# Patient Record
Sex: Female | Born: 1935 | Race: Black or African American | Hispanic: No | Marital: Married | Smoking: Never smoker
Health system: Southern US, Community
[De-identification: ages and names within clinical notes are randomized; demographics above are authoritative.]

## PROBLEM LIST (undated history)

## (undated) DIAGNOSIS — Z9289 Personal history of other medical treatment: Secondary | ICD-10-CM

## (undated) DIAGNOSIS — G2581 Restless legs syndrome: Secondary | ICD-10-CM

## (undated) DIAGNOSIS — N3281 Overactive bladder: Secondary | ICD-10-CM

## (undated) DIAGNOSIS — C8590 Non-Hodgkin lymphoma, unspecified, unspecified site: Secondary | ICD-10-CM

## (undated) DIAGNOSIS — M359 Systemic involvement of connective tissue, unspecified: Secondary | ICD-10-CM

## (undated) DIAGNOSIS — I679 Cerebrovascular disease, unspecified: Secondary | ICD-10-CM

## (undated) DIAGNOSIS — I2510 Atherosclerotic heart disease of native coronary artery without angina pectoris: Secondary | ICD-10-CM

## (undated) DIAGNOSIS — Z7952 Long term (current) use of systemic steroids: Secondary | ICD-10-CM

## (undated) DIAGNOSIS — M1990 Unspecified osteoarthritis, unspecified site: Secondary | ICD-10-CM

## (undated) DIAGNOSIS — Q249 Congenital malformation of heart, unspecified: Secondary | ICD-10-CM

## (undated) DIAGNOSIS — F32A Depression, unspecified: Secondary | ICD-10-CM

## (undated) DIAGNOSIS — I639 Cerebral infarction, unspecified: Secondary | ICD-10-CM

## (undated) DIAGNOSIS — I739 Peripheral vascular disease, unspecified: Secondary | ICD-10-CM

## (undated) DIAGNOSIS — F329 Major depressive disorder, single episode, unspecified: Secondary | ICD-10-CM

## (undated) DIAGNOSIS — R55 Syncope and collapse: Principal | ICD-10-CM

## (undated) DIAGNOSIS — I509 Heart failure, unspecified: Secondary | ICD-10-CM

## (undated) DIAGNOSIS — E669 Obesity, unspecified: Secondary | ICD-10-CM

## (undated) DIAGNOSIS — T1490XA Injury, unspecified, initial encounter: Secondary | ICD-10-CM

## (undated) DIAGNOSIS — I10 Essential (primary) hypertension: Secondary | ICD-10-CM

## (undated) DIAGNOSIS — C8190 Hodgkin lymphoma, unspecified, unspecified site: Secondary | ICD-10-CM

## (undated) DIAGNOSIS — N390 Urinary tract infection, site not specified: Secondary | ICD-10-CM

## (undated) DIAGNOSIS — D649 Anemia, unspecified: Secondary | ICD-10-CM

## (undated) DIAGNOSIS — E119 Type 2 diabetes mellitus without complications: Secondary | ICD-10-CM

## (undated) DIAGNOSIS — E785 Hyperlipidemia, unspecified: Secondary | ICD-10-CM

## (undated) HISTORY — DX: Type 2 diabetes mellitus without complications: E11.9

## (undated) HISTORY — PX: CARDIAC CATHETERIZATION: SHX172

## (undated) HISTORY — DX: Urinary tract infection, site not specified: N39.0

## (undated) HISTORY — PX: ANKLE SURGERY: SHX546

## (undated) HISTORY — DX: Unspecified osteoarthritis, unspecified site: M19.90

## (undated) HISTORY — DX: Syncope and collapse: R55

## (undated) HISTORY — DX: Depression, unspecified: F32.A

## (undated) HISTORY — PX: BILATERAL CARPAL TUNNEL RELEASE: SHX6508

## (undated) HISTORY — DX: Cerebral infarction, unspecified: I63.9

## (undated) HISTORY — DX: Atherosclerotic heart disease of native coronary artery without angina pectoris: I25.10

## (undated) HISTORY — DX: Peripheral vascular disease, unspecified: I73.9

## (undated) HISTORY — DX: Anemia, unspecified: D64.9

## (undated) HISTORY — DX: Major depressive disorder, single episode, unspecified: F32.9

## (undated) HISTORY — DX: Congenital malformation of heart, unspecified: Q24.9

## (undated) HISTORY — DX: Non-Hodgkin lymphoma, unspecified, unspecified site: C85.90

## (undated) HISTORY — DX: Injury, unspecified, initial encounter: T14.90XA

## (undated) HISTORY — DX: Obesity, unspecified: E66.9

## (undated) HISTORY — PX: CATARACT EXTRACTION, BILATERAL: SHX1313

## (undated) HISTORY — DX: Hodgkin lymphoma, unspecified, unspecified site: C81.90

## (undated) HISTORY — DX: Restless legs syndrome: G25.81

## (undated) HISTORY — DX: Overactive bladder: N32.81

## (undated) HISTORY — DX: Cerebrovascular disease, unspecified: I67.9

## (undated) HISTORY — PX: ABDOMINAL HYSTERECTOMY: SHX81

## (undated) HISTORY — DX: Hyperlipidemia, unspecified: E78.5

## (undated) HISTORY — PX: ORIF PATELLA: SHX5033

---

## 1987-08-01 HISTORY — PX: MEDIAL PARTIAL KNEE REPLACEMENT: SHX5965

## 1996-07-31 DIAGNOSIS — C8190 Hodgkin lymphoma, unspecified, unspecified site: Secondary | ICD-10-CM

## 1996-07-31 DIAGNOSIS — C8590 Non-Hodgkin lymphoma, unspecified, unspecified site: Secondary | ICD-10-CM

## 1996-07-31 HISTORY — DX: Non-Hodgkin lymphoma, unspecified, unspecified site: C85.90

## 1996-07-31 HISTORY — DX: Hodgkin lymphoma, unspecified, unspecified site: C81.90

## 1997-07-31 DIAGNOSIS — I2510 Atherosclerotic heart disease of native coronary artery without angina pectoris: Secondary | ICD-10-CM

## 1997-07-31 HISTORY — DX: Atherosclerotic heart disease of native coronary artery without angina pectoris: I25.10

## 1998-04-07 ENCOUNTER — Inpatient Hospital Stay (HOSPITAL_COMMUNITY): Admission: EM | Admit: 1998-04-07 | Discharge: 1998-04-09 | Payer: Self-pay | Admitting: Emergency Medicine

## 1999-03-16 ENCOUNTER — Other Ambulatory Visit: Admission: RE | Admit: 1999-03-16 | Discharge: 1999-03-16 | Payer: Self-pay | Admitting: Family Medicine

## 2000-01-11 ENCOUNTER — Other Ambulatory Visit: Admission: RE | Admit: 2000-01-11 | Discharge: 2000-01-11 | Payer: Self-pay | Admitting: Otolaryngology

## 2000-01-11 ENCOUNTER — Encounter: Payer: Self-pay | Admitting: Emergency Medicine

## 2000-01-11 ENCOUNTER — Emergency Department (HOSPITAL_COMMUNITY): Admission: EM | Admit: 2000-01-11 | Discharge: 2000-01-11 | Payer: Self-pay | Admitting: Emergency Medicine

## 2000-03-12 ENCOUNTER — Encounter: Payer: Self-pay | Admitting: Family Medicine

## 2000-03-12 ENCOUNTER — Encounter: Admission: RE | Admit: 2000-03-12 | Discharge: 2000-03-12 | Payer: Self-pay | Admitting: Family Medicine

## 2001-01-29 ENCOUNTER — Encounter (INDEPENDENT_AMBULATORY_CARE_PROVIDER_SITE_OTHER): Payer: Self-pay | Admitting: Internal Medicine

## 2001-07-31 DIAGNOSIS — T1490XA Injury, unspecified, initial encounter: Secondary | ICD-10-CM

## 2001-07-31 HISTORY — PX: LEG AMPUTATION BELOW KNEE: SHX694

## 2001-07-31 HISTORY — DX: Injury, unspecified, initial encounter: T14.90XA

## 2002-05-07 ENCOUNTER — Encounter (INDEPENDENT_AMBULATORY_CARE_PROVIDER_SITE_OTHER): Payer: Self-pay | Admitting: *Deleted

## 2002-05-07 ENCOUNTER — Encounter: Payer: Self-pay | Admitting: Surgery

## 2002-05-07 ENCOUNTER — Inpatient Hospital Stay (HOSPITAL_COMMUNITY): Admission: AC | Admit: 2002-05-07 | Discharge: 2002-05-23 | Payer: Self-pay

## 2002-05-07 ENCOUNTER — Encounter: Payer: Self-pay | Admitting: Emergency Medicine

## 2002-05-08 ENCOUNTER — Encounter: Payer: Self-pay | Admitting: Orthopedic Surgery

## 2002-05-14 ENCOUNTER — Encounter: Payer: Self-pay | Admitting: Cardiology

## 2002-05-15 ENCOUNTER — Encounter: Payer: Self-pay | Admitting: General Surgery

## 2002-05-27 ENCOUNTER — Inpatient Hospital Stay (HOSPITAL_COMMUNITY): Admission: AD | Admit: 2002-05-27 | Discharge: 2002-06-02 | Payer: Self-pay | Admitting: Orthopedic Surgery

## 2002-05-27 ENCOUNTER — Encounter: Payer: Self-pay | Admitting: Orthopedic Surgery

## 2002-11-06 ENCOUNTER — Encounter: Admission: RE | Admit: 2002-11-06 | Discharge: 2003-02-04 | Payer: Self-pay | Admitting: Orthopedic Surgery

## 2003-02-05 ENCOUNTER — Encounter: Admission: RE | Admit: 2003-02-05 | Discharge: 2003-04-25 | Payer: Self-pay | Admitting: Orthopedic Surgery

## 2004-10-19 ENCOUNTER — Ambulatory Visit: Payer: Self-pay | Admitting: Cardiology

## 2004-12-01 ENCOUNTER — Ambulatory Visit: Payer: Self-pay | Admitting: Cardiology

## 2005-05-26 ENCOUNTER — Ambulatory Visit: Payer: Self-pay | Admitting: Physical Medicine & Rehabilitation

## 2005-05-26 ENCOUNTER — Encounter
Admission: RE | Admit: 2005-05-26 | Discharge: 2005-08-24 | Payer: Self-pay | Admitting: Physical Medicine & Rehabilitation

## 2005-08-31 ENCOUNTER — Encounter (INDEPENDENT_AMBULATORY_CARE_PROVIDER_SITE_OTHER): Payer: Self-pay | Admitting: Internal Medicine

## 2006-07-31 DIAGNOSIS — I679 Cerebrovascular disease, unspecified: Secondary | ICD-10-CM

## 2006-07-31 DIAGNOSIS — I739 Peripheral vascular disease, unspecified: Secondary | ICD-10-CM

## 2006-07-31 HISTORY — PX: COLONOSCOPY: SHX174

## 2006-07-31 HISTORY — DX: Cerebrovascular disease, unspecified: I67.9

## 2006-07-31 HISTORY — DX: Peripheral vascular disease, unspecified: I73.9

## 2006-12-07 ENCOUNTER — Ambulatory Visit (HOSPITAL_COMMUNITY): Admission: RE | Admit: 2006-12-07 | Discharge: 2006-12-07 | Payer: Self-pay | Admitting: Podiatry

## 2006-12-19 ENCOUNTER — Ambulatory Visit: Payer: Self-pay | Admitting: Cardiology

## 2006-12-19 ENCOUNTER — Encounter (INDEPENDENT_AMBULATORY_CARE_PROVIDER_SITE_OTHER): Payer: Self-pay | Admitting: Internal Medicine

## 2007-01-16 ENCOUNTER — Ambulatory Visit: Payer: Self-pay | Admitting: Vascular Surgery

## 2007-04-02 ENCOUNTER — Encounter (INDEPENDENT_AMBULATORY_CARE_PROVIDER_SITE_OTHER): Payer: Self-pay | Admitting: Internal Medicine

## 2007-04-02 LAB — CONVERTED CEMR LAB
Albumin: 3.7 g/dL
BUN: 13 mg/dL
Calcium: 9.1 mg/dL
Creatinine, Ser: 0.87 mg/dL
GFR calc Af Amer: >60 mL/min
GFR calc non Af Amer: >60 mL/min
HDL: 58 mg/dL
Total Bilirubin: 0.2 mg/dL
Triglycerides: 42 mg/dL

## 2007-04-03 ENCOUNTER — Encounter (INDEPENDENT_AMBULATORY_CARE_PROVIDER_SITE_OTHER): Payer: Self-pay | Admitting: Internal Medicine

## 2007-06-03 ENCOUNTER — Emergency Department (HOSPITAL_COMMUNITY): Admission: EM | Admit: 2007-06-03 | Discharge: 2007-06-03 | Payer: Self-pay | Admitting: Emergency Medicine

## 2007-06-06 ENCOUNTER — Ambulatory Visit: Payer: Self-pay | Admitting: Internal Medicine

## 2007-06-06 DIAGNOSIS — R32 Unspecified urinary incontinence: Secondary | ICD-10-CM | POA: Insufficient documentation

## 2007-06-06 DIAGNOSIS — E785 Hyperlipidemia, unspecified: Secondary | ICD-10-CM

## 2007-06-06 DIAGNOSIS — N318 Other neuromuscular dysfunction of bladder: Secondary | ICD-10-CM | POA: Insufficient documentation

## 2007-06-06 DIAGNOSIS — Z9889 Other specified postprocedural states: Secondary | ICD-10-CM

## 2007-06-06 HISTORY — DX: Hyperlipidemia, unspecified: E78.5

## 2007-06-07 ENCOUNTER — Encounter (INDEPENDENT_AMBULATORY_CARE_PROVIDER_SITE_OTHER): Payer: Self-pay | Admitting: Internal Medicine

## 2007-06-08 ENCOUNTER — Telehealth (INDEPENDENT_AMBULATORY_CARE_PROVIDER_SITE_OTHER): Payer: Self-pay | Admitting: Family Medicine

## 2007-06-10 ENCOUNTER — Ambulatory Visit: Payer: Self-pay | Admitting: Internal Medicine

## 2007-06-10 LAB — CONVERTED CEMR LAB: Hemoglobin: 12.7 g/dL

## 2007-06-11 ENCOUNTER — Ambulatory Visit (HOSPITAL_COMMUNITY): Admission: RE | Admit: 2007-06-11 | Discharge: 2007-06-11 | Payer: Self-pay | Admitting: Internal Medicine

## 2007-06-11 ENCOUNTER — Encounter (INDEPENDENT_AMBULATORY_CARE_PROVIDER_SITE_OTHER): Payer: Self-pay | Admitting: Internal Medicine

## 2007-06-13 ENCOUNTER — Ambulatory Visit: Payer: Self-pay | Admitting: Cardiology

## 2007-06-14 ENCOUNTER — Encounter (INDEPENDENT_AMBULATORY_CARE_PROVIDER_SITE_OTHER): Payer: Self-pay | Admitting: Family Medicine

## 2007-06-15 ENCOUNTER — Inpatient Hospital Stay (HOSPITAL_COMMUNITY): Admission: EM | Admit: 2007-06-15 | Discharge: 2007-06-17 | Payer: Self-pay | Admitting: Emergency Medicine

## 2007-06-20 ENCOUNTER — Telehealth (INDEPENDENT_AMBULATORY_CARE_PROVIDER_SITE_OTHER): Payer: Self-pay | Admitting: *Deleted

## 2007-06-20 ENCOUNTER — Ambulatory Visit: Payer: Self-pay | Admitting: Internal Medicine

## 2007-06-21 ENCOUNTER — Telehealth (INDEPENDENT_AMBULATORY_CARE_PROVIDER_SITE_OTHER): Payer: Self-pay | Admitting: *Deleted

## 2007-06-21 LAB — CONVERTED CEMR LAB
Ferritin: 181 ng/mL (ref 10–291)
Iron: 47 ug/dL (ref 42–145)

## 2007-06-24 ENCOUNTER — Telehealth (INDEPENDENT_AMBULATORY_CARE_PROVIDER_SITE_OTHER): Payer: Self-pay | Admitting: *Deleted

## 2007-07-15 ENCOUNTER — Encounter (INDEPENDENT_AMBULATORY_CARE_PROVIDER_SITE_OTHER): Payer: Self-pay | Admitting: Internal Medicine

## 2007-07-17 ENCOUNTER — Ambulatory Visit: Payer: Self-pay | Admitting: Internal Medicine

## 2007-07-18 LAB — CONVERTED CEMR LAB
Creatinine, Urine: 181.4 mg/dL
Microalb, Ur: 0.84 mg/dL (ref 0.00–1.89)

## 2007-07-19 ENCOUNTER — Telehealth (INDEPENDENT_AMBULATORY_CARE_PROVIDER_SITE_OTHER): Payer: Self-pay | Admitting: *Deleted

## 2007-07-22 ENCOUNTER — Encounter (INDEPENDENT_AMBULATORY_CARE_PROVIDER_SITE_OTHER): Payer: Self-pay | Admitting: Internal Medicine

## 2007-07-22 ENCOUNTER — Telehealth (INDEPENDENT_AMBULATORY_CARE_PROVIDER_SITE_OTHER): Payer: Self-pay | Admitting: Internal Medicine

## 2007-08-23 ENCOUNTER — Telehealth (INDEPENDENT_AMBULATORY_CARE_PROVIDER_SITE_OTHER): Payer: Self-pay | Admitting: *Deleted

## 2007-09-05 ENCOUNTER — Telehealth (INDEPENDENT_AMBULATORY_CARE_PROVIDER_SITE_OTHER): Payer: Self-pay | Admitting: *Deleted

## 2007-09-16 ENCOUNTER — Encounter (INDEPENDENT_AMBULATORY_CARE_PROVIDER_SITE_OTHER): Payer: Self-pay | Admitting: Internal Medicine

## 2007-09-26 ENCOUNTER — Telehealth (INDEPENDENT_AMBULATORY_CARE_PROVIDER_SITE_OTHER): Payer: Self-pay | Admitting: *Deleted

## 2007-09-27 ENCOUNTER — Encounter (INDEPENDENT_AMBULATORY_CARE_PROVIDER_SITE_OTHER): Payer: Self-pay | Admitting: Internal Medicine

## 2007-10-16 ENCOUNTER — Ambulatory Visit: Payer: Self-pay | Admitting: Internal Medicine

## 2007-10-16 LAB — CONVERTED CEMR LAB
Blood Glucose, Fingerstick: 127
Hgb A1c MFr Bld: 6.7 %

## 2007-10-30 ENCOUNTER — Telehealth (INDEPENDENT_AMBULATORY_CARE_PROVIDER_SITE_OTHER): Payer: Self-pay | Admitting: *Deleted

## 2007-11-05 ENCOUNTER — Encounter (INDEPENDENT_AMBULATORY_CARE_PROVIDER_SITE_OTHER): Payer: Self-pay | Admitting: Internal Medicine

## 2008-01-08 ENCOUNTER — Ambulatory Visit: Payer: Self-pay | Admitting: Internal Medicine

## 2008-01-08 ENCOUNTER — Ambulatory Visit (HOSPITAL_COMMUNITY): Admission: RE | Admit: 2008-01-08 | Discharge: 2008-01-08 | Payer: Self-pay | Admitting: Internal Medicine

## 2008-01-09 LAB — CONVERTED CEMR LAB
CO2: 26 meq/L (ref 19–32)
Cholesterol: 142 mg/dL (ref 0–200)
Creatinine, Ser: 0.98 mg/dL (ref 0.40–1.20)
Eosinophils Absolute: 0.2 10*3/uL (ref 0.0–0.7)
Eosinophils Relative: 4 % (ref 0–5)
Glucose, Bld: 83 mg/dL (ref 70–99)
HCT: 36.1 % (ref 36.0–46.0)
Lymphs Abs: 1.4 10*3/uL (ref 0.7–4.0)
MCV: 83.4 fL (ref 78.0–100.0)
Monocytes Absolute: 0.3 10*3/uL (ref 0.1–1.0)
Monocytes Relative: 6 % (ref 3–12)
RBC: 4.33 M/uL (ref 3.87–5.11)
Total Bilirubin: 0.3 mg/dL (ref 0.3–1.2)
Total CHOL/HDL Ratio: 2.4
Total Protein: 7.4 g/dL (ref 6.0–8.3)
Triglycerides: 75 mg/dL (ref <150)
VLDL: 15 mg/dL (ref 0–40)
WBC: 6.1 10*3/uL (ref 4.0–10.5)

## 2008-01-14 ENCOUNTER — Encounter (INDEPENDENT_AMBULATORY_CARE_PROVIDER_SITE_OTHER): Payer: Self-pay | Admitting: Internal Medicine

## 2008-01-15 ENCOUNTER — Encounter (INDEPENDENT_AMBULATORY_CARE_PROVIDER_SITE_OTHER): Payer: Self-pay | Admitting: Internal Medicine

## 2008-01-24 ENCOUNTER — Encounter (INDEPENDENT_AMBULATORY_CARE_PROVIDER_SITE_OTHER): Payer: Self-pay | Admitting: Internal Medicine

## 2008-01-27 ENCOUNTER — Telehealth (INDEPENDENT_AMBULATORY_CARE_PROVIDER_SITE_OTHER): Payer: Self-pay | Admitting: *Deleted

## 2008-01-28 ENCOUNTER — Telehealth (INDEPENDENT_AMBULATORY_CARE_PROVIDER_SITE_OTHER): Payer: Self-pay | Admitting: *Deleted

## 2008-02-24 ENCOUNTER — Ambulatory Visit: Admission: RE | Admit: 2008-02-24 | Discharge: 2008-02-24 | Payer: Self-pay | Admitting: Internal Medicine

## 2008-02-24 ENCOUNTER — Encounter (INDEPENDENT_AMBULATORY_CARE_PROVIDER_SITE_OTHER): Payer: Self-pay | Admitting: Internal Medicine

## 2008-03-10 ENCOUNTER — Ambulatory Visit: Payer: Self-pay | Admitting: Pulmonary Disease

## 2008-03-27 ENCOUNTER — Telehealth (INDEPENDENT_AMBULATORY_CARE_PROVIDER_SITE_OTHER): Payer: Self-pay | Admitting: *Deleted

## 2008-04-09 ENCOUNTER — Ambulatory Visit: Payer: Self-pay | Admitting: Internal Medicine

## 2008-04-09 LAB — CONVERTED CEMR LAB: Blood Glucose, Fingerstick: 114

## 2008-04-13 ENCOUNTER — Encounter (INDEPENDENT_AMBULATORY_CARE_PROVIDER_SITE_OTHER): Payer: Self-pay | Admitting: Internal Medicine

## 2008-05-05 ENCOUNTER — Ambulatory Visit: Payer: Self-pay | Admitting: Internal Medicine

## 2008-05-15 ENCOUNTER — Ambulatory Visit: Payer: Self-pay | Admitting: Internal Medicine

## 2008-05-15 LAB — CONVERTED CEMR LAB
Bilirubin Urine: NEGATIVE
Glucose, Urine, Semiquant: NEGATIVE
Ketones, urine, test strip: NEGATIVE
Nitrite: NEGATIVE
Protein, U semiquant: NEGATIVE
Specific Gravity, Urine: 1.015
Urobilinogen, UA: NEGATIVE
pH: 5.5

## 2008-05-24 ENCOUNTER — Emergency Department (HOSPITAL_COMMUNITY): Admission: EM | Admit: 2008-05-24 | Discharge: 2008-05-24 | Payer: Self-pay | Admitting: Emergency Medicine

## 2008-07-09 ENCOUNTER — Ambulatory Visit: Payer: Self-pay | Admitting: Internal Medicine

## 2008-07-09 LAB — CONVERTED CEMR LAB
Blood Glucose, Fingerstick: 106
Hgb A1c MFr Bld: 6.8 %

## 2008-07-10 ENCOUNTER — Encounter (INDEPENDENT_AMBULATORY_CARE_PROVIDER_SITE_OTHER): Payer: Self-pay | Admitting: Internal Medicine

## 2008-07-13 LAB — CONVERTED CEMR LAB
ALT: 13 units/L (ref 0–35)
Alkaline Phosphatase: 69 units/L (ref 39–117)
Basophils Absolute: 0 10*3/uL (ref 0.0–0.1)
Basophils Relative: 0 % (ref 0–1)
Eosinophils Absolute: 0.3 10*3/uL (ref 0.0–0.7)
Eosinophils Relative: 4 % (ref 0–5)
HCT: 38.2 % (ref 36.0–46.0)
LDL Cholesterol: 55 mg/dL (ref 0–99)
MCHC: 29.8 g/dL — ABNORMAL LOW (ref 30.0–36.0)
MCV: 86.4 fL (ref 78.0–100.0)
Microalb Creat Ratio: 6.1 mg/g (ref 0.0–30.0)
Platelets: 242 10*3/uL (ref 150–400)
RDW: 15.4 % (ref 11.5–15.5)
Sodium: 141 meq/L (ref 135–145)
Total Bilirubin: 0.5 mg/dL (ref 0.3–1.2)
Total CHOL/HDL Ratio: 2.2
Total Protein: 7.5 g/dL (ref 6.0–8.3)
VLDL: 16 mg/dL (ref 0–40)

## 2008-08-06 ENCOUNTER — Encounter (INDEPENDENT_AMBULATORY_CARE_PROVIDER_SITE_OTHER): Payer: Self-pay | Admitting: Internal Medicine

## 2008-09-08 ENCOUNTER — Encounter (INDEPENDENT_AMBULATORY_CARE_PROVIDER_SITE_OTHER): Payer: Self-pay | Admitting: Internal Medicine

## 2008-09-16 ENCOUNTER — Encounter (INDEPENDENT_AMBULATORY_CARE_PROVIDER_SITE_OTHER): Payer: Self-pay | Admitting: Internal Medicine

## 2008-10-07 ENCOUNTER — Ambulatory Visit (HOSPITAL_COMMUNITY): Admission: RE | Admit: 2008-10-07 | Discharge: 2008-10-07 | Payer: Self-pay | Admitting: Internal Medicine

## 2008-10-07 ENCOUNTER — Ambulatory Visit: Payer: Self-pay | Admitting: Internal Medicine

## 2008-10-08 ENCOUNTER — Encounter (INDEPENDENT_AMBULATORY_CARE_PROVIDER_SITE_OTHER): Payer: Self-pay | Admitting: Internal Medicine

## 2008-10-08 ENCOUNTER — Telehealth (INDEPENDENT_AMBULATORY_CARE_PROVIDER_SITE_OTHER): Payer: Self-pay | Admitting: Internal Medicine

## 2008-10-08 LAB — CONVERTED CEMR LAB
Band Neutrophils: 0 % (ref 0–10)
Basophils Absolute: 0 10*3/uL (ref 0.0–0.1)
Eosinophils Absolute: 0.2 10*3/uL (ref 0.0–0.7)
Lymphocytes Relative: 24 % (ref 12–46)
Lymphs Abs: 1.6 10*3/uL (ref 0.7–4.0)
MCHC: 32.7 g/dL (ref 30.0–36.0)
Neutrophils Relative %: 66 % (ref 43–77)
Platelets: 245 10*3/uL (ref 150–400)
RBC: 4.38 M/uL (ref 3.87–5.11)
WBC: 6.9 10*3/uL (ref 4.0–10.5)

## 2008-10-09 ENCOUNTER — Encounter (INDEPENDENT_AMBULATORY_CARE_PROVIDER_SITE_OTHER): Payer: Self-pay | Admitting: Internal Medicine

## 2008-10-13 ENCOUNTER — Ambulatory Visit (HOSPITAL_COMMUNITY): Admission: RE | Admit: 2008-10-13 | Discharge: 2008-10-13 | Payer: Self-pay | Admitting: Internal Medicine

## 2008-11-09 ENCOUNTER — Ambulatory Visit: Payer: Self-pay | Admitting: Internal Medicine

## 2008-12-18 ENCOUNTER — Telehealth (INDEPENDENT_AMBULATORY_CARE_PROVIDER_SITE_OTHER): Payer: Self-pay | Admitting: Internal Medicine

## 2009-01-12 ENCOUNTER — Ambulatory Visit: Payer: Self-pay | Admitting: Internal Medicine

## 2009-01-12 LAB — CONVERTED CEMR LAB: Blood Glucose, Fingerstick: 124

## 2009-01-13 ENCOUNTER — Encounter (INDEPENDENT_AMBULATORY_CARE_PROVIDER_SITE_OTHER): Payer: Self-pay | Admitting: Internal Medicine

## 2009-01-13 LAB — CONVERTED CEMR LAB
ALT: 11 units/L (ref 0–35)
BUN: 21 mg/dL (ref 6–23)
Basophils Absolute: 0 10*3/uL (ref 0.0–0.1)
Basophils Relative: 0 % (ref 0–1)
CO2: 24 meq/L (ref 19–32)
Calcium: 9 mg/dL (ref 8.4–10.5)
Chloride: 106 meq/L (ref 96–112)
Cholesterol: 122 mg/dL (ref 0–200)
Creatinine, Ser: 0.93 mg/dL (ref 0.40–1.20)
Eosinophils Absolute: 0.3 10*3/uL (ref 0.0–0.7)
Eosinophils Relative: 3 % (ref 0–5)
Glucose, Bld: 99 mg/dL (ref 70–99)
Hemoglobin: 10.4 g/dL — ABNORMAL LOW (ref 12.0–15.0)
MCHC: 30.2 g/dL (ref 30.0–36.0)
MCV: 82.7 fL (ref 78.0–100.0)
Monocytes Absolute: 0.4 10*3/uL (ref 0.1–1.0)
Monocytes Relative: 6 % (ref 3–12)
Neutro Abs: 5.3 10*3/uL (ref 1.7–7.7)
RBC: 4.16 M/uL (ref 3.87–5.11)
RDW: 15.7 % — ABNORMAL HIGH (ref 11.5–15.5)
Total Bilirubin: 0.3 mg/dL (ref 0.3–1.2)
Total CHOL/HDL Ratio: 2.2
Triglycerides: 70 mg/dL (ref <150)
VLDL: 14 mg/dL (ref 0–40)

## 2009-01-14 ENCOUNTER — Ambulatory Visit (HOSPITAL_COMMUNITY): Admission: RE | Admit: 2009-01-14 | Discharge: 2009-01-14 | Payer: Self-pay | Admitting: Internal Medicine

## 2009-01-18 ENCOUNTER — Telehealth (INDEPENDENT_AMBULATORY_CARE_PROVIDER_SITE_OTHER): Payer: Self-pay | Admitting: Internal Medicine

## 2009-03-02 ENCOUNTER — Encounter (INDEPENDENT_AMBULATORY_CARE_PROVIDER_SITE_OTHER): Payer: Self-pay | Admitting: Internal Medicine

## 2009-09-06 ENCOUNTER — Emergency Department (HOSPITAL_COMMUNITY): Admission: EM | Admit: 2009-09-06 | Discharge: 2009-09-06 | Payer: Self-pay | Admitting: Emergency Medicine

## 2009-10-28 ENCOUNTER — Inpatient Hospital Stay (HOSPITAL_COMMUNITY): Admission: EM | Admit: 2009-10-28 | Discharge: 2009-10-31 | Payer: Self-pay | Admitting: Emergency Medicine

## 2009-11-15 ENCOUNTER — Ambulatory Visit: Payer: Self-pay | Admitting: Cardiovascular Disease

## 2009-11-16 ENCOUNTER — Encounter: Payer: Self-pay | Admitting: Cardiovascular Disease

## 2009-11-24 ENCOUNTER — Ambulatory Visit: Payer: Self-pay | Admitting: Cardiology

## 2009-11-24 ENCOUNTER — Encounter (HOSPITAL_COMMUNITY): Admission: RE | Admit: 2009-11-24 | Discharge: 2009-12-24 | Payer: Self-pay | Admitting: Cardiovascular Disease

## 2009-11-25 ENCOUNTER — Encounter: Payer: Self-pay | Admitting: Cardiovascular Disease

## 2009-11-29 ENCOUNTER — Encounter: Payer: Self-pay | Admitting: Cardiovascular Disease

## 2009-11-29 ENCOUNTER — Ambulatory Visit: Payer: Self-pay | Admitting: Cardiology

## 2009-11-29 ENCOUNTER — Encounter (INDEPENDENT_AMBULATORY_CARE_PROVIDER_SITE_OTHER): Payer: Self-pay | Admitting: *Deleted

## 2009-11-30 ENCOUNTER — Encounter: Payer: Self-pay | Admitting: Adult Health

## 2010-02-23 ENCOUNTER — Emergency Department (HOSPITAL_COMMUNITY): Admission: EM | Admit: 2010-02-23 | Discharge: 2010-02-23 | Payer: Self-pay | Admitting: Emergency Medicine

## 2010-04-05 ENCOUNTER — Encounter (INDEPENDENT_AMBULATORY_CARE_PROVIDER_SITE_OTHER): Payer: Self-pay | Admitting: *Deleted

## 2010-04-05 LAB — CONVERTED CEMR LAB
BUN: 13 mg/dL
CO2: 26 meq/L
Calcium: 9.2 mg/dL
Chloride: 105 meq/L
Glomerular Filtration Rate, Af Am: >60 mL/min/{1.73_m2}
Hgb A1c MFr Bld: 7.9 %
Potassium: 3.8 meq/L
Sodium: 145 meq/L
Sodium: 145 meq/L
TSH: 1.212 microintl units/mL

## 2010-05-12 ENCOUNTER — Emergency Department (HOSPITAL_COMMUNITY): Admission: EM | Admit: 2010-05-12 | Discharge: 2010-05-12 | Payer: Self-pay | Admitting: Emergency Medicine

## 2010-07-08 ENCOUNTER — Encounter (INDEPENDENT_AMBULATORY_CARE_PROVIDER_SITE_OTHER): Payer: Self-pay | Admitting: *Deleted

## 2010-07-31 DIAGNOSIS — R55 Syncope and collapse: Secondary | ICD-10-CM

## 2010-07-31 HISTORY — DX: Syncope and collapse: R55

## 2010-08-10 ENCOUNTER — Encounter (INDEPENDENT_AMBULATORY_CARE_PROVIDER_SITE_OTHER): Payer: Self-pay | Admitting: *Deleted

## 2010-08-11 ENCOUNTER — Ambulatory Visit
Admission: RE | Admit: 2010-08-11 | Discharge: 2010-08-11 | Payer: Self-pay | Source: Home / Self Care | Attending: Cardiology | Admitting: Cardiology

## 2010-08-11 DIAGNOSIS — E669 Obesity, unspecified: Secondary | ICD-10-CM | POA: Insufficient documentation

## 2010-08-30 NOTE — Letter (Signed)
Summary: [place] Results Engineer, agricultural at [facility]  [address], [place] [address]   Phone: [phone]  Fax: [phone]      Nov 29, 2009 MRN: [id number]   [name] [address] [place], [place]  [address]   Dear [name],  Your test ordered by Selena Batten has been reviewed by your physician (or physician assistant) and was found to be normal or stable. Your physician (or physician assistant) felt no changes were needed at this time.  ____ Echocardiogram  __x__ Cardiac Stress Test  ____ Lab Work  ____ Peripheral vascular study of arms, legs or neck  ____ CT scan or X-ray  ____ Lung or Breathing test  ____ Other:  No change in medical treatment at this time, per Dr. Eden Emms.  Thank you, Kalli Greenfield Allyne Gee RN    Reader Bing, MD, Lenise Arena.C.Gaylord Shih, MD, F.A.C.C Lewayne Bunting, MD, F.A.C.C Nona Dell, MD, F.A.C.C Charlton Haws, MD, Lenise Arena.C.C

## 2010-08-30 NOTE — Assessment & Plan Note (Signed)
Summary: f/u myoview and echo to be done 4/27/tg   Visit Type:  Follow-up Primary Provider:  Dr.Preston clark  CC:  discuss stress test.  History of Present Illness: [name] is a pleasant 77 y/o AAF with known history of CAD, stents to unknow coronary arteries, mild AS, normal myoview in 2003. and right cerebellar CVA.  She is here on follow up for test results after having stress myoview and echocardiogram after being seen by Dr. Eden Emms in the office on April 18th, 2011.  She also has a history of hypertension,DM, hyperlipidemia, and CHF.  She is asymptomatic with the exception of mild DOE.  Stress myoview dated 11/24/2009 revealed no evidence of ischemia, infarction or stress induced EKG abnormalities. EF 62%.     Echocardiogram dated 11/24/2009 wall thickness was increased in the pattern of mild LVH.  Systolic fx was vigorous. EF was 65%-70%.  Aortic Valve mildly to moderately calcified annulus.  Mildly thickend, moderately calcified leaflets.  Cusp separation was mildly reduced.  There was very stenosis.  Compared to the prior study of 05/14/02, LV systolic fx is improved; very modest progressin of AV disease over the past 8 years.  Preventive Screening-Counseling & Management  Alcohol-Tobacco     Smoking Status: never  Current Medications (verified): 1)  Aspir-Low 81 Mg Tbec (Aspirin) .... Take 1 Tab Daily 2)  Multivitamins   Tabs (Multiple Vitamin) .Marland Kitchen.. 1 By Mouth Once Daily 3)  Plavix 75 Mg  Tabs (Clopidogrel Bisulfate) .Marland Kitchen.. 1 By Mouth Once Daily 4)  Janumet 50-500 Mg  Tabs (Sitagliptin-Metformin Hcl) .Marland Kitchen.. 1 By Mouth Two Times A Day 5)  Prednisone 10 Mg Tabs (Prednisone) .... Take 1 Tab Daily 6)  Losartan Potassium-Hctz 100-25 Mg Tabs (Losartan Potassium-Hctz) .... Take 1 Tab Daily 7)  Pravastatin Sodium 40 Mg Tabs (Pravastatin Sodium) .... Take 1 Tab Daily 8)  Leflunomide 20 Mg Tabs (Leflunomide) .... Take 1 Tab Daily 9)  Alendronate Sodium 70 Mg Tabs (Alendronate Sodium)  .... Take 1 Tab Weekly  Allergies (verified): No Known Drug Allergies  Comments:  Nurse/Medical Assistant: The patient is currently on medications but does not know the name or dosage at this time. Instructed to contact our office with details. Will update medication list at that time.  Past History:  Past medical, surgical, family and social histories (including risk factors) reviewed, and no changes noted (except as noted below).  Past Medical History: Reviewed history from 04/09/2008 and no changes required. Anemia-NOS Congestive heart failure Diabetes mellitus, type II Hyperlipidemia Hypertension Urinary incontinence overactive bladder arthritis carpal tunnel  lymphoma Rheumatoid arthritis Coronary artery disease right inferior cerebellar infarct herpes simplex RLS  Past Surgical History: Reviewed history from 06/06/2007 and no changes required. Hysterectomy Total knee replacement- left R BKA s/p trauma ankle replacement-left Cardiac stent 1999-RCA cataract b/l  Family History: Reviewed history from 01/12/2009 and no changes required. father-deceased-HTN, heart problems mother-deceased-HTN, lung problems daughter-45-migraines son-52-back problems son-54-sinus, allergies daughter-61-arthritis  Social History: Reviewed history from 01/12/2009 and no changes required. Married lives with spouse--54 years Never Smoked Alcohol use-no Drug use-no  Review of Systems       Mild DOE All other systems have been reviewed and are negative unless stated above.   Vital Signs:  Patient profile:   77 year old female Height:      59 inches Weight:      211 pounds Pulse rate:   81 / minute BP sitting:   147 / 70  (left arm) Cuff size:  regular  Vitals Entered By: Carlye Grippe (Nov 29, 2009 1:56 PM) CC: discuss stress test   Physical Exam  General:  Well developed, well nourished, in no acute distress. Lungs:  Clear bilaterally to auscultation and  percussion. Heart:  Grade  1/6 SEM loudest primary aortic area  Psych:  Normal affect.   EKG  Procedure date:  11/29/2009  Findings:      Normal sinus rhythm with rate of:82bpm  Left ventricular hypertrophy.    Impression & Recommendations:  Problem # 1:  CARDIAC MURMUR (ICD-785.2) Assessment Unchanged  Her updated medication list for this problem includes:    Losartan Potassium-hctz 100-25 Mg Tabs (Losartan potassium-hctz) .Marland Kitchen... Take 1 tab daily  Orders: EKG w/ Interpretation (93000)  Problem # 2:  CORONARY ARTERY DISEASE (ICD-414.00) Stress myoview is reassuring.  No changes in her medications. DOE is probably related to deconditioning and weight as she is very sedentary.I have encouraged her to increase her activity and walk at least each day at a leisurely pace. Her updated medication list for this problem includes:    Aspir-low 81 Mg Tbec (Aspirin) .Marland Kitchen... Take 1 tab daily    Plavix 75 Mg Tabs (Clopidogrel bisulfate) .Marland Kitchen... 1 by mouth once daily  Patient Instructions: 1)  Your physician recommends that you schedule a follow-up appointment in: 6 months 2)  Your physician recommends that you continue on your current medications as directed. Please refer to the Current Medication list given to you today.

## 2010-08-30 NOTE — Letter (Signed)
Summary: [place] [facility] (Nuc Med Stress)  [facility]  [address], [place] [address]   Phone: [phone]  Fax: [phone]    Nuclear Medicine 1-Day Stress Test Information Sheet  Re:     [name]   DOB:     March 12, 1928 MRN:     [id number] Weight:  Appointment Date: Register at: Appointment Time: Referring MD:  ___Exercise Stress  __Adenosine   __Dobutamine  _X_Lexiscan  __Persantine   __Thallium  Urgency: ____1 (next day)   ____2 (one week)    ____3 (PRN)  Patient will receive Follow Up call with results: Patient needs follow-up appointment:  Instructions regarding medication:  How to prepare for your stress test: 1. DO NOT eat or dring 6 hours prior to your arrival time. This includes no caffeine (coffee, tea, sodas, chocolate) if you were instructed to take your medications, drink water with it. 2. DO NOT use any tobacco products for at leaset 8 hours prior to arrival. 3. DO NOT wear dresses or any clothing that may have metal clasps or buttons. 4. Wear short sleeve shirts, loose clothing, and comfortalbe walking shoes. 5. DO NOT use lotions, oils or powder on your chest before the test. 6. The test will take approximately 3-4 hours from the time you arrive until completion. 7. To register the day of the test, go to the Short Stay entrance at [facility]. 8. If you must cancel your test, call [phone] as soon as you are aware. 9. Plese do not take Janumet dose the morning of test.  After you arrive for test:   When you arrive at [facility], you will go to Short Stay to be registered. They will then send you to Radiology to check in. The Nuclear Medicine Tech will get you and start an IV in your arm or hand. A small amount of a radioactive tracer will then be injected into your IV. This tracer will then have to circulate for 30-45 minutes. During this time you will wait in the waiting room and you will be able to drink  something without caffeine. A series of pictures will be taken of your heart follwoing this waiting period. After the 1st set of pictures you will go to the stress lab to get ready for your stress test. During the stress test, another small amount of a radioactive tracer will be injected through your IV. When the stress test is complete, there is a short rest period while your heart rate and blood pressure will be monitored. When this monitoring period is complete you will have another set of pictrues taken. (The same as the 1st set of pictures). These pictures are taken between 15 minutes and 1 hour after the stress test. The time depends on the type of stress test you had. Your doctor will inform you of your test results within 7 days after test.    The possibilities of certain changes are possible during the test. They include abnormal blood pressure and disorders of the heart. Side effects of persantine or adenosine can include flushing, chest pain, shortness of breath, stomach tightness, headache and light-headedness. These side effects usually do not last long and are self-resolving. Every effort will be made to keep you comfortable and to minimize complications by obtaining a medical history and by close observation during the test. Emergency equipment, medications, and trained personnel are available to deal with any unusual situation which may arise.  Please notify office at  least 48 hours in advance if you are unable to keep this appt.

## 2010-08-30 NOTE — Letter (Signed)
Summary: [place] Results Engineer, agricultural at [facility]  [address], [place] [address]   Phone: [phone]  Fax: [phone]      Nov 29, 2009 MRN: [id number]   ANUSHREE DORSI [address] [place], [place]  [address]   Dear Ms. Dorin,  Your test ordered by Selena Batten has been reviewed by your physician (or physician assistant) and was found to be normal or stable. Your physician (or physician assistant) felt no changes were needed at this time.  __X__ Echocardiogram  ____ Cardiac Stress Test  ____ Lab Work  ____ Peripheral vascular study of arms, legs or neck  ____ CT scan or X-ray  ____ Lung or Breathing test  ____ Other: Please continue on current medical treatment.  Thank you.   Charlton Haws, MD, F.A.C.C

## 2010-08-30 NOTE — Letter (Signed)
Summary: ov  ov   Imported By: Faythe Ghee 11/16/2009 11:51:02  _____________________________________________________________________  External Attachment:    Type:   Image     Comment:   External Document

## 2010-08-30 NOTE — Letter (Signed)
Summary: [place] Results Engineer, agricultural at [facility]  [address] [place] [address]   Phone: [phone]  Fax: [phone]      November 25, 2009 MRN: [id number]   [name] [address] [place], [place]  [address]   Dear Ms. Sadowski,  Your test ordered by Selena Batten has been reviewed by your physician (or physician assistant) and was found to be normal or stable. Your physician (or physician assistant) felt no changes were needed at this time.  __X__ Echocardiogram  ____ Cardiac Stress Test  ____ Lab Work  ____ Peripheral vascular study of arms, legs or neck  ____ CT scan or X-ray  ____ Lung or Breathing test  ____ Other: Please continue on current medical treatment.  Thank you.   Charlton Haws, MD, F.A.C.C

## 2010-08-30 NOTE — Assessment & Plan Note (Signed)
Summary: **8per Dr.Preston Chestine Spore to establish w/Cardiology/tg   Visit Type:  Initial Consult Primary Provider:  Dr.Preston clark  CC:  sob with excertion.  History of Present Illness: [name] is seen today at the request of Dr Chestine Spore for F/U of CAD with recent admission for TIA.  She was initially seen by TW/RR in the late 90's and had stenting of cors records not available electronically in echart.  She subsequently had a syncopal w/u in 2003 and was seen by Dr Graciela Husbands who did not think a pacer was needed.  At that time she had mild AS, EF 50-55%, and a normal myovue.  She ambulates with a walkier and LLE prosthesis.  She's had a previous right cerebellar CVA and is on ASA Plavix for CAD and TIA.  W/U 4/1-4/3 at [facility] negative with consult from Dr [name].  She denies SOB, palpitaoitns, sncope or SSCP.  She seems to have arthritis in both hands and some dementia with forgetfullness.    Current Problems (verified): 1)  Leg Pain, Left  (ICD-729.5) 2)  Rhinitis  (ICD-472.0) 3)  Restless Leg Syndrome  (ICD-333.94) 4)  Cerebellar Infarct  (ICD-436) 5)  Cardiac Murmur  (ICD-785.2) 6)  Palpitations  (ICD-785.1) 7)  Coronary Artery Disease  (ICD-414.00) 8)  Rheumatoid Arthritis  (ICD-714.0) 9)  Non-hodgkin's Lymphoma  (ICD-202.80) 10)  Carpal Tunnel Release, Hx of  (ICD-V45.89) 11)  Arthritis  (ICD-716.90) 12)  Overactive Bladder  (ICD-596.51) 13)  Arrhythmia, Hx of  (ICD-V12.50) 14)  Urinary Incontinence  (ICD-788.30) 15)  Hypertension  (ICD-401.9) 16)  Hyperlipidemia  (ICD-272.4) 17)  Diabetes Mellitus, Type II  (ICD-250.00) 18)  Congestive Heart Failure  (ICD-428.0) 19)  Anemia-nos  (ICD-285.9)  Current Medications (verified): 1)  Aspir-Low 81 Mg Tbec (Aspirin) .... Take 1 Tab Daily 2)  Multivitamins   Tabs (Multiple Vitamin) .Marland Kitchen.. 1 By Mouth Once Daily 3)  Plavix 75 Mg  Tabs (Clopidogrel Bisulfate) .Marland Kitchen.. 1 By Mouth Once Daily 4)  Janumet 50-500 Mg  Tabs (Sitagliptin-Metformin Hcl)  .Marland Kitchen.. 1 By Mouth Two Times A Day 5)  Prednisone 10 Mg Tabs (Prednisone) .... Take 1 Tab Daily 6)  Losartan Potassium-Hctz 100-25 Mg Tabs (Losartan Potassium-Hctz) .... Take 1 Tab Daily 7)  Pravastatin Sodium 40 Mg Tabs (Pravastatin Sodium) .... Take 1 Tab Daily 8)  Leflunomide 20 Mg Tabs (Leflunomide) .... Take 1 Tab Daily 9)  Alendronate Sodium 70 Mg Tabs (Alendronate Sodium) .... Take 1 Tab Weekly  Allergies (verified): No Known Drug Allergies  Past History:  Past Medical History: Last updated: 04/09/2008 Anemia-NOS Congestive heart failure Diabetes mellitus, type II Hyperlipidemia Hypertension Urinary incontinence overactive bladder arthritis carpal tunnel  lymphoma Rheumatoid arthritis Coronary artery disease right inferior cerebellar infarct herpes simplex RLS  Past Surgical History: Last updated: 06/06/2007 Hysterectomy Total knee replacement- left R BKA s/p trauma ankle replacement-left Cardiac stent 1999-RCA cataract b/l  Family History: Last updated: January 18, 2009 father-deceased-HTN, heart problems mother-deceased-HTN, lung problems daughter-45-migraines son-52-back problems son-54-sinus, allergies daughter-61-arthritis  Social History: Last updated: 01-18-09 Married lives with spouse--54 years Never Smoked Alcohol use-no Drug use-no  Review of Systems       Denies fever, malais, weight loss, blurry vision, decreased visual acuity, cough, sputum, SOB, hemoptysis, pleuritic pain, palpitaitons, heartburn, abdominal pain, melena, lower extremity edema, claudication, or rash.   Vital Signs:  Patient profile:   77 year old female Height:      59 inches Weight:      188 pounds BMI:     38.11 Pulse  rate:   78 / minute BP sitting:   166 / 76  (right arm)  Vitals Entered By: Dreama Saa, CNA (November 15, 2009 11:41 AM)  Physical Exam  General:  Affect appropriate Healthy:  appears stated age HEENT: normal Neck supple with no adenopathy JVP  normal no bruits no thyromegaly Lungs clear with no wheezing and good diaphragmatic motion Heart:  S1/S2 mild AS  murmur,rub, gallop or click PMI normal Abdomen: benighn, BS positve, no tenderness, no AAA no bruit.  No HSM or HJR Plus one LLE  edema Neuro non-focal Skin warm and dry S/P RLE amputation below knee   Impression & Recommendations:  Problem # 1:  CARDIAC MURMUR (ICD-785.2) F/U echo to see if AS has progressed The following medications were removed from the medication list:    Fosinopril Sodium 40 Mg Tabs (Fosinopril sodium) .Marland Kitchen... 1 by mouth once daily    Furosemide 40 Mg Tabs (Furosemide) .Marland Kitchen... 1 by mouth once daily Her updated medication list for this problem includes:    Losartan Potassium-hctz 100-25 Mg Tabs (Losartan potassium-hctz) .Marland Kitchen... Take 1 tab daily  Orders: Nuclear Stress Test (Nuc Stress Test) 2-D Echocardiogram (2D Echo)  Problem # 2:  CORONARY ARTERY DISEASE (ICD-414.00) Doubt contributing to symptoms but not evaluated in 8 years  Lexiscan Myovue The following medications were removed from the medication list:    Fosinopril Sodium 40 Mg Tabs (Fosinopril sodium) .Marland Kitchen... 1 by mouth once daily Her updated medication list for this problem includes:    Aspir-low 81 Mg Tbec (Aspirin) .Marland Kitchen... Take 1 tab daily    Plavix 75 Mg Tabs (Clopidogrel bisulfate) .Marland Kitchen... 1 by mouth once daily  Problem # 3:  CEREBELLAR INFARCT (ICD-436) ? Recent TIA w/u per neurology.  Continue ASA and Plavix  Patient Instructions: 1)  Your physician recommends that you schedule a follow-up appointment in: Post test 2)  Your physician has requested that you have an echocardiogram.  Echocardiography is a painless test that uses sound waves to create images of your heart. It provides your doctor with information about the size and shape of your heart and how well your heart's chambers and valves are working.  This procedure takes approximately one hour. There are no restrictions for this  procedure. 3)  Your physician has requested that you have an [employer]  For further information please visit [email or web address]  Please follow instruction sheet, as given.   EKG Report  Procedure date:  11/15/2009  Findings:      NSR 81 LAE LVH [facility] no change from 2003

## 2010-08-30 NOTE — Letter (Signed)
Summary: Discharge Summary 10-31-09  Discharge Summary 10-31-09   Imported By: Faythe Ghee 11/16/2009 11:50:18  _____________________________________________________________________  External Attachment:    Type:   Image     Comment:   External Document

## 2010-08-30 NOTE — Letter (Signed)
Summary: Appointment - Reminder 2  [facility]. [address]   [place], [place] [address]   Phone: [phone]  Fax: [phone]     July 08, 2010 MRN: [id number]   [name] [address] [place], [place]  [address]   Dear [name],  Our records indicate that it is time to schedule a follow-up appointment.  Dr.  Dietrich Pates        recommended that you follow up with [place] in    11.2011 PAST DUE        . It is very important that we reach you to schedule this appointment. We look forward to participating in your health care needs. Please contact us at the number listed above at your earliest convenience to schedule your appointment.  If you are unable to make an appointment at this time, give [place] a call so we can update our records.     Sincerely,   Glass blower/designer
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
## 2010-09-01 NOTE — Miscellaneous (Signed)
Summary: LABS BMP,A1C,04/05/2010  Clinical Lists Changes  Observations: Added new observation of CALCIUM: 9.2 mg/dL (16/05/9603 54:09) Added new observation of GFR AA: >60 mL/min/1.25m2 (04/05/2010 16:47) Added new observation of GFR: >60 mL/min (04/05/2010 16:47) Added new observation of CREATININE: 0.81 mg/dL (81/19/1478 29:56) Added new observation of BUN: 13 mg/dL (21/30/8657 84:69) Added new observation of BG RANDOM: 189 mg/dL (62/95/2841 32:44) Added new observation of CO2 PLSM/SER: 26 meq/L (04/05/2010 16:47) Added new observation of CL SERUM: 105 meq/L (04/05/2010 16:47) Added new observation of K SERUM: 3.8 meq/L (04/05/2010 16:47) Added new observation of NA: 145 meq/L (04/05/2010 16:47) Added new observation of TSH: 1.212 microintl units/mL (04/05/2010 16:47) Added new observation of HGBA1C: 7.9 % (04/05/2010 16:47)

## 2010-10-13 ENCOUNTER — Emergency Department (HOSPITAL_COMMUNITY): Payer: Medicare Other

## 2010-10-13 ENCOUNTER — Emergency Department (HOSPITAL_COMMUNITY)
Admission: EM | Admit: 2010-10-13 | Discharge: 2010-10-13 | Disposition: A | Discharge status: Home / Self Care | Payer: Medicare Other | Attending: Emergency Medicine | Admitting: Emergency Medicine

## 2010-10-13 DIAGNOSIS — E119 Type 2 diabetes mellitus without complications: Secondary | ICD-10-CM | POA: Insufficient documentation

## 2010-10-13 DIAGNOSIS — Z87898 Personal history of other specified conditions: Secondary | ICD-10-CM | POA: Insufficient documentation

## 2010-10-13 DIAGNOSIS — M069 Rheumatoid arthritis, unspecified: Secondary | ICD-10-CM | POA: Insufficient documentation

## 2010-10-13 DIAGNOSIS — G501 Atypical facial pain: Secondary | ICD-10-CM | POA: Insufficient documentation

## 2010-10-13 DIAGNOSIS — Z79899 Other long term (current) drug therapy: Secondary | ICD-10-CM | POA: Insufficient documentation

## 2010-10-13 DIAGNOSIS — Z8673 Personal history of transient ischemic attack (TIA), and cerebral infarction without residual deficits: Secondary | ICD-10-CM | POA: Insufficient documentation

## 2010-10-13 DIAGNOSIS — Z8674 Personal history of sudden cardiac arrest: Secondary | ICD-10-CM | POA: Insufficient documentation

## 2010-10-13 DIAGNOSIS — R0602 Shortness of breath: Secondary | ICD-10-CM | POA: Insufficient documentation

## 2010-10-13 DIAGNOSIS — I10 Essential (primary) hypertension: Secondary | ICD-10-CM | POA: Insufficient documentation

## 2010-10-13 LAB — CBC
HCT: 35.4 % — ABNORMAL LOW (ref 36.0–46.0)
MCH: 24.7 pg — ABNORMAL LOW (ref 26.0–34.0)
MCHC: 31.1 g/dL (ref 30.0–36.0)
MCV: 79.6 fL (ref 78.0–100.0)
Platelets: 198 10*3/uL (ref 150–400)
RDW: 14.5 % (ref 11.5–15.5)
RDW: 15.1 % (ref 11.5–15.5)
WBC: 7.4 10*3/uL (ref 4.0–10.5)

## 2010-10-13 LAB — URINALYSIS, ROUTINE W REFLEX MICROSCOPIC
Glucose, UA: NEGATIVE mg/dL
Leukocytes, UA: NEGATIVE
pH: 6.5 (ref 5.0–8.0)

## 2010-10-13 LAB — COMPREHENSIVE METABOLIC PANEL
ALT: 15 U/L (ref 0–35)
Albumin: 3.2 g/dL — ABNORMAL LOW (ref 3.5–5.2)
Alkaline Phosphatase: 60 U/L (ref 39–117)
BUN: 14 mg/dL (ref 6–23)
Potassium: 3.8 mEq/L (ref 3.5–5.1)
Sodium: 141 mEq/L (ref 135–145)
Total Protein: 6.7 g/dL (ref 6.0–8.3)

## 2010-10-13 LAB — BASIC METABOLIC PANEL
BUN: 12 mg/dL (ref 6–23)
CO2: 29 mEq/L (ref 19–32)
Calcium: 9.1 mg/dL (ref 8.4–10.5)
Creatinine, Ser: 0.85 mg/dL (ref 0.4–1.2)
GFR calc non Af Amer: >60 mL/min (ref >60)
Glucose, Bld: 191 mg/dL — ABNORMAL HIGH (ref 70–99)
Sodium: 141 mEq/L (ref 135–145)

## 2010-10-13 LAB — URINE MICROSCOPIC-ADD ON

## 2010-10-13 LAB — DIFFERENTIAL
Basophils Absolute: 0 10*3/uL (ref 0.0–0.1)
Basophils Relative: 0 % (ref 0–1)
Eosinophils Relative: 1 % (ref 0–5)
Lymphocytes Relative: 15 % (ref 12–46)
Lymphs Abs: 0.7 10*3/uL (ref 0.7–4.0)
Monocytes Absolute: 0.3 10*3/uL (ref 0.1–1.0)
Monocytes Absolute: 0.5 10*3/uL (ref 0.1–1.0)
Monocytes Relative: 4 % (ref 3–12)
Monocytes Relative: 7 % (ref 3–12)
Neutro Abs: 6.4 10*3/uL (ref 1.7–7.7)

## 2010-10-14 ENCOUNTER — Emergency Department (HOSPITAL_COMMUNITY)
Admission: EM | Admit: 2010-10-14 | Discharge: 2010-10-14 | Disposition: A | Discharge status: Home / Self Care | Payer: Medicare Other | Attending: Emergency Medicine | Admitting: Emergency Medicine

## 2010-10-14 ENCOUNTER — Emergency Department (HOSPITAL_COMMUNITY): Payer: Medicare Other

## 2010-10-14 ENCOUNTER — Encounter (HOSPITAL_COMMUNITY): Payer: Self-pay | Admitting: Radiology

## 2010-10-14 DIAGNOSIS — Z8673 Personal history of transient ischemic attack (TIA), and cerebral infarction without residual deficits: Secondary | ICD-10-CM | POA: Insufficient documentation

## 2010-10-14 DIAGNOSIS — M542 Cervicalgia: Secondary | ICD-10-CM | POA: Insufficient documentation

## 2010-10-14 DIAGNOSIS — E119 Type 2 diabetes mellitus without complications: Secondary | ICD-10-CM | POA: Insufficient documentation

## 2010-10-14 DIAGNOSIS — I10 Essential (primary) hypertension: Secondary | ICD-10-CM | POA: Insufficient documentation

## 2010-10-14 DIAGNOSIS — R51 Headache: Secondary | ICD-10-CM | POA: Insufficient documentation

## 2010-10-14 DIAGNOSIS — M069 Rheumatoid arthritis, unspecified: Secondary | ICD-10-CM | POA: Insufficient documentation

## 2010-10-14 DIAGNOSIS — I509 Heart failure, unspecified: Secondary | ICD-10-CM | POA: Insufficient documentation

## 2010-10-14 DIAGNOSIS — Z8571 Personal history of Hodgkin lymphoma: Secondary | ICD-10-CM | POA: Insufficient documentation

## 2010-10-14 HISTORY — DX: Essential (primary) hypertension: I10

## 2010-10-14 LAB — POCT I-STAT, CHEM 8
Calcium, Ion: 0.96 mmol/L — ABNORMAL LOW (ref 1.12–1.32)
Glucose, Bld: 216 mg/dL — ABNORMAL HIGH (ref 70–99)
HCT: 34 % — ABNORMAL LOW (ref 36.0–46.0)
TCO2: 30 mmol/L (ref 0–100)

## 2010-10-14 MED ORDER — IOHEXOL 300 MG/ML  SOLN
70.0000 mL | Freq: Once | INTRAMUSCULAR | Status: AC | PRN
  Administered 2010-10-14: 70 mL via INTRAVENOUS

## 2010-10-17 ENCOUNTER — Telehealth: Payer: Self-pay | Admitting: Gastroenterology

## 2010-10-17 ENCOUNTER — Emergency Department (HOSPITAL_COMMUNITY)
Admission: EM | Admit: 2010-10-17 | Discharge: 2010-10-17 | Disposition: A | Discharge status: Home / Self Care | Payer: Medicare Other | Source: Home / Self Care | Attending: Emergency Medicine | Admitting: Emergency Medicine

## 2010-10-17 DIAGNOSIS — I509 Heart failure, unspecified: Secondary | ICD-10-CM | POA: Insufficient documentation

## 2010-10-17 DIAGNOSIS — M069 Rheumatoid arthritis, unspecified: Secondary | ICD-10-CM | POA: Insufficient documentation

## 2010-10-17 DIAGNOSIS — R51 Headache: Secondary | ICD-10-CM | POA: Insufficient documentation

## 2010-10-17 DIAGNOSIS — R070 Pain in throat: Secondary | ICD-10-CM | POA: Insufficient documentation

## 2010-10-17 DIAGNOSIS — I10 Essential (primary) hypertension: Secondary | ICD-10-CM | POA: Insufficient documentation

## 2010-10-17 DIAGNOSIS — E119 Type 2 diabetes mellitus without complications: Secondary | ICD-10-CM | POA: Insufficient documentation

## 2010-10-17 DIAGNOSIS — Z79899 Other long term (current) drug therapy: Secondary | ICD-10-CM | POA: Insufficient documentation

## 2010-10-17 DIAGNOSIS — B3781 Candidal esophagitis: Secondary | ICD-10-CM | POA: Insufficient documentation

## 2010-10-19 ENCOUNTER — Ambulatory Visit: Payer: Medicare Other | Admitting: Physician Assistant

## 2010-10-19 ENCOUNTER — Inpatient Hospital Stay (HOSPITAL_COMMUNITY)
Admission: AD | Admit: 2010-10-19 | Discharge: 2010-10-21 | DRG: 158 | Disposition: A | Discharge status: Home / Self Care | Payer: Medicare Other | Source: Ambulatory Visit | Attending: Gastroenterology | Admitting: Gastroenterology

## 2010-10-19 ENCOUNTER — Encounter: Payer: Self-pay | Admitting: Physician Assistant

## 2010-10-19 VITALS — BP 152/66 | HR 76 | Ht 60.0 in | Wt 207.0 lb

## 2010-10-19 DIAGNOSIS — B370 Candidal stomatitis: Principal | ICD-10-CM | POA: Diagnosis present

## 2010-10-19 DIAGNOSIS — R1310 Dysphagia, unspecified: Secondary | ICD-10-CM | POA: Diagnosis present

## 2010-10-19 DIAGNOSIS — Z8673 Personal history of transient ischemic attack (TIA), and cerebral infarction without residual deficits: Secondary | ICD-10-CM

## 2010-10-19 DIAGNOSIS — Z7982 Long term (current) use of aspirin: Secondary | ICD-10-CM

## 2010-10-19 DIAGNOSIS — B3781 Candidal esophagitis: Secondary | ICD-10-CM

## 2010-10-19 DIAGNOSIS — R1319 Other dysphagia: Secondary | ICD-10-CM

## 2010-10-19 DIAGNOSIS — M069 Rheumatoid arthritis, unspecified: Secondary | ICD-10-CM | POA: Diagnosis present

## 2010-10-19 DIAGNOSIS — Z7902 Long term (current) use of antithrombotics/antiplatelets: Secondary | ICD-10-CM

## 2010-10-19 DIAGNOSIS — I10 Essential (primary) hypertension: Secondary | ICD-10-CM | POA: Diagnosis present

## 2010-10-19 DIAGNOSIS — Z9861 Coronary angioplasty status: Secondary | ICD-10-CM

## 2010-10-19 DIAGNOSIS — C8589 Other specified types of non-Hodgkin lymphoma, extranodal and solid organ sites: Secondary | ICD-10-CM | POA: Diagnosis present

## 2010-10-19 DIAGNOSIS — I2510 Atherosclerotic heart disease of native coronary artery without angina pectoris: Secondary | ICD-10-CM | POA: Diagnosis present

## 2010-10-19 DIAGNOSIS — Z79899 Other long term (current) drug therapy: Secondary | ICD-10-CM

## 2010-10-19 DIAGNOSIS — IMO0002 Reserved for concepts with insufficient information to code with codable children: Secondary | ICD-10-CM

## 2010-10-19 DIAGNOSIS — E119 Type 2 diabetes mellitus without complications: Secondary | ICD-10-CM | POA: Diagnosis present

## 2010-10-19 LAB — GLUCOSE, CAPILLARY
Glucose-Capillary: 107 mg/dL — ABNORMAL HIGH (ref 70–99)
Glucose-Capillary: 157 mg/dL — ABNORMAL HIGH (ref 70–99)
Glucose-Capillary: 172 mg/dL — ABNORMAL HIGH (ref 70–99)
Glucose-Capillary: 272 mg/dL — ABNORMAL HIGH (ref 70–99)
Glucose-Capillary: 319 mg/dL — ABNORMAL HIGH (ref 70–99)

## 2010-10-19 LAB — DIFFERENTIAL
Basophils Absolute: 0 10*3/uL (ref 0.0–0.1)
Basophils Absolute: 0 10*3/uL (ref 0.0–0.1)
Basophils Relative: 0 % (ref 0–1)
Basophils Relative: 0 % (ref 0–1)
Eosinophils Absolute: 0 10*3/uL (ref 0.0–0.7)
Eosinophils Relative: 1 % (ref 0–5)
Lymphocytes Relative: 19 % (ref 12–46)
Lymphs Abs: 1.1 10*3/uL (ref 0.7–4.0)
Lymphs Abs: 1.8 10*3/uL (ref 0.7–4.0)
Monocytes Absolute: 0.5 10*3/uL (ref 0.1–1.0)
Monocytes Relative: 2 % — ABNORMAL LOW (ref 3–12)
Monocytes Relative: 7 % (ref 3–12)
Neutro Abs: 5.2 10*3/uL (ref 1.7–7.7)
Neutro Abs: 7.2 10*3/uL (ref 1.7–7.7)
Neutro Abs: 8.2 10*3/uL — ABNORMAL HIGH (ref 1.7–7.7)
Neutro Abs: 6.6 10*3/uL (ref 1.7–7.7)
Neutrophils Relative %: 76 % (ref 43–77)
Neutrophils Relative %: 87 % — ABNORMAL HIGH (ref 43–77)
Neutrophils Relative %: 77 % (ref 43–77)

## 2010-10-19 LAB — CBC
HCT: 30.9 % — ABNORMAL LOW (ref 36.0–46.0)
Hemoglobin: 12.3 g/dL (ref 12.0–15.0)
Hemoglobin: 10.7 g/dL — ABNORMAL LOW (ref 12.0–15.0)
MCHC: 33.1 g/dL (ref 30.0–36.0)
MCHC: 33.2 g/dL (ref 30.0–36.0)
MCV: 80.1 fL (ref 78.0–100.0)
MCV: 80.2 fL (ref 78.0–100.0)
RBC: 4.37 MIL/uL (ref 3.87–5.11)
RBC: 4.63 MIL/uL (ref 3.87–5.11)
RBC: 4.25 MIL/uL (ref 3.87–5.11)
RDW: 15.5 % (ref 11.5–15.5)
RDW: 15.5 % (ref 11.5–15.5)
WBC: 7 10*3/uL (ref 4.0–10.5)
WBC: 9.5 10*3/uL (ref 4.0–10.5)

## 2010-10-19 LAB — BASIC METABOLIC PANEL
BUN: 9 mg/dL (ref 6–23)
Calcium: 9.1 mg/dL (ref 8.4–10.5)
Chloride: 101 mEq/L (ref 96–112)
Creatinine, Ser: 0.68 mg/dL (ref 0.4–1.2)
Creatinine, Ser: 0.76 mg/dL (ref 0.4–1.2)
GFR calc Af Amer: >60 mL/min (ref >60)
GFR calc non Af Amer: >60 mL/min (ref >60)

## 2010-10-19 LAB — COMPREHENSIVE METABOLIC PANEL
ALT: 16 U/L (ref 0–35)
AST: 20 U/L (ref 0–37)
Albumin: 2.9 g/dL — ABNORMAL LOW (ref 3.5–5.2)
BUN: 11 mg/dL (ref 6–23)
CO2: 29 mEq/L (ref 19–32)
Calcium: 9.3 mg/dL (ref 8.4–10.5)
Chloride: 99 mEq/L (ref 96–112)
Creatinine, Ser: 0.78 mg/dL (ref 0.4–1.2)
Creatinine, Ser: 0.87 mg/dL (ref 0.4–1.2)
GFR calc Af Amer: >60 mL/min (ref >60)
GFR calc non Af Amer: >60 mL/min (ref >60)
Glucose, Bld: 157 mg/dL — ABNORMAL HIGH (ref 70–99)
Potassium: 3.3 mEq/L — ABNORMAL LOW (ref 3.5–5.1)
Sodium: 140 mEq/L (ref 135–145)
Total Bilirubin: 0.2 mg/dL — ABNORMAL LOW (ref 0.3–1.2)

## 2010-10-19 LAB — LIPID PANEL
Cholesterol: 122 mg/dL (ref 0–200)
LDL Cholesterol: 51 mg/dL (ref 0–99)
Total CHOL/HDL Ratio: 2.1 RATIO
Triglycerides: 65 mg/dL (ref <150)

## 2010-10-19 LAB — URINE CULTURE
Colony Count: 50000
Special Requests: NEGATIVE

## 2010-10-19 LAB — PROTIME-INR
INR: 1.14 (ref 0.00–1.49)
Prothrombin Time: 14.5 seconds (ref 11.6–15.2)
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
## 2010-10-20 ENCOUNTER — Encounter: Payer: Self-pay | Admitting: Gastroenterology

## 2010-10-20 DIAGNOSIS — R1319 Other dysphagia: Secondary | ICD-10-CM

## 2010-10-20 DIAGNOSIS — B370 Candidal stomatitis: Secondary | ICD-10-CM

## 2010-10-20 LAB — GLUCOSE, CAPILLARY
Glucose-Capillary: 212 mg/dL — ABNORMAL HIGH (ref 70–99)
Glucose-Capillary: 235 mg/dL — ABNORMAL HIGH (ref 70–99)
Glucose-Capillary: 278 mg/dL — ABNORMAL HIGH (ref 70–99)

## 2010-10-21 DIAGNOSIS — R933 Abnormal findings on diagnostic imaging of other parts of digestive tract: Secondary | ICD-10-CM

## 2010-10-21 DIAGNOSIS — R1310 Dysphagia, unspecified: Secondary | ICD-10-CM

## 2010-10-21 LAB — BASIC METABOLIC PANEL
CO2: 30 mEq/L (ref 19–32)
Calcium: 8.6 mg/dL (ref 8.4–10.5)
Creatinine, Ser: 0.57 mg/dL (ref 0.4–1.2)
GFR calc Af Amer: >60 mL/min (ref >60)
Glucose, Bld: 106 mg/dL — ABNORMAL HIGH (ref 70–99)

## 2010-10-23 LAB — URINE MICROSCOPIC-ADD ON

## 2010-10-23 LAB — COMPREHENSIVE METABOLIC PANEL
ALT: 15 U/L (ref 0–35)
AST: 16 U/L (ref 0–37)
CO2: 28 mEq/L (ref 19–32)
Calcium: 9.2 mg/dL (ref 8.4–10.5)
Chloride: 105 mEq/L (ref 96–112)
GFR calc Af Amer: >60 mL/min (ref >60)
GFR calc non Af Amer: >60 mL/min (ref >60)
Sodium: 140 mEq/L (ref 135–145)
Total Bilirubin: 0.5 mg/dL (ref 0.3–1.2)

## 2010-10-23 LAB — POCT CARDIAC MARKERS
CKMB, poc: 1 ng/mL (ref 1.0–8.0)
Troponin i, poc: <0.05 ng/mL (ref 0.00–0.09)

## 2010-10-23 LAB — CBC
RBC: 4.09 MIL/uL (ref 3.87–5.11)
WBC: 7.7 10*3/uL (ref 4.0–10.5)

## 2010-10-23 LAB — DIFFERENTIAL
Eosinophils Absolute: 0.1 10*3/uL (ref 0.0–0.7)
Eosinophils Relative: 2 % (ref 0–5)
Lymphs Abs: 0.7 10*3/uL (ref 0.7–4.0)

## 2010-10-23 LAB — URINALYSIS, ROUTINE W REFLEX MICROSCOPIC
Bilirubin Urine: NEGATIVE
Specific Gravity, Urine: 1.01 (ref 1.005–1.030)
pH: 7.5 (ref 5.0–8.0)

## 2010-10-23 LAB — PROTIME-INR: Prothrombin Time: 13.6 seconds (ref 11.6–15.2)

## 2010-10-27 NOTE — Procedures (Signed)
Summary: Upper Endoscopy  Patient: [name] Note: All result statuses are Final unless otherwise noted.  Tests: (1) Upper Endoscopy (EGD)   EGD Upper Endoscopy       DONE     [facility]     [address] [place], [place]  [address]          ENDOSCOPY PROCEDURE REPORT          PATIENT:  Sabrina, [name]  MR#:  [id number]     BIRTHDATE:  September 07, 1927, 82 yrs. old  GENDER:  female     ENDOSCOPIST:  Rachael Fee, MD     PROCEDURE DATE:  10/20/2010     PROCEDURE:  EGD, diagnostic 43235     ASA CLASS:  Class II     INDICATIONS:  trouble swallowing, thrush in mouth     MEDICATIONS:   Fentanyl 25 mcg IV, Versed 3 mg IV     TOPICAL ANESTHETIC:  none          DESCRIPTION OF PROCEDURE:   After the risks benefits and     alternatives of the procedure were thoroughly explained, informed     consent was obtained.  The Pentax Gastroscope [id number] endoscope     was introduced through the mouth and advanced to the second     portion of the duodenum, without limitations.  The instrument was     slowly withdrawn as the mucosa was fully examined.     <<PROCEDUREIMAGES>>          There was tongue, mouth thrush.  The upper, middle, and distal     third of the esophagus were carefully inspected and no     abnormalities were noted. The z-line was well seen at the GEJ. The     endoscope was pushed into the fundus which was normal including a     retroflexed view. The antrum,gastric body, first and second part     of the duodenum were unremarkable (see image7, image6, image5,     image4, image1, image2, and image3).    Retroflexed views revealed     no abnormalities.    The scope was then withdrawn from the patient     and the procedure completed.          COMPLICATIONS:  None          ENDOSCOPIC IMPRESSION:     1) Normal EGD exept for oral thrush     2) No esophagitis; her swallowing problems are from the oral     thrush          RECOMMENDATIONS:     Will  continue IV diflucan until she can reliably take PO meds     Slowly reintroduce POs.          ______________________________     Rachael Fee, MD          cc: Claudette Head, MD          n.     Rosalie Doctor:   Rachael Fee at 10/20/2010 09:34 AM          Palma Holter, [id number]  Note: An exclamation mark (!) indicates a result that was not dispersed into the flowsheet. Document Creation Date: 10/20/2010 9:35 AM _______________________________________________________________________  (1) Order result status: Final Collection or observation date-time: 10/20/2010 09:28 Requested date-time:  Receipt date-time:  Reported date-time:  Referring Physician:   Ordering Physician: [name] [phone]) Specimen Source:  Source: Kem Parkinson  Filler Order Number: [id number] Lab site:

## 2010-10-27 NOTE — Progress Notes (Signed)
Summary: triage  Phone Note Call from Patient Call back at [phone] or [phone]   Caller: Daughter Cornelia  or Deidra [phone] Call For: DOD Dr Russella Dar Reason for Call: Talk to Nurse Summary of Call: Daughter wants mother seen before first available appt 4-24 for Candida Esophagitis, pt was seen in the hosp. and they told her she needed to see a gi, pt is having a lot of pain on th eright side of her head and her throat. Initial call taken by: Tawni Levy,  October 17, 2010 4:32 PM  Follow-up for Phone Call        Spoke with Cornelia(daughter). patient in ER today. Having difficulty swallowing, thrush, ER told them to see GI. Patient does not have any reports in [facility] or [facility], multimedia or E chart for GI. Scheduled patient with Mike Gip, PA on 10/19/10 at 9:30 AM. Follow-up by: Jesse Fall RN,  October 17, 2010 5:06 PM

## 2010-10-31 ENCOUNTER — Telehealth: Payer: Self-pay | Admitting: Physician Assistant

## 2010-10-31 NOTE — Telephone Encounter (Signed)
Patient's daughter calling to ask if her mother needs to see Dr. Lazarus Salines. She states when her mother was in the ER, they told her to see an ENT. She wants to know if she still needs to see an ENT. Please, advisel

## 2010-10-31 NOTE — Telephone Encounter (Signed)
If she is better ,and not having any more facial pain or difficulty swallowing from her severe oral thrush then she does not need to see Dr. Cloria Spring or anybody for ENT  eval.

## 2010-10-31 NOTE — Telephone Encounter (Signed)
Left message for patient's daughter to call me back ?

## 2010-10-31 NOTE — Telephone Encounter (Signed)
I don't know why they asked her to see [facility]. Please check with Mike Gip as she saw the patient most recently.

## 2010-11-01 NOTE — Telephone Encounter (Signed)
Patient's daughter given Mike Gip, [place] recommendations

## 2010-11-16 ENCOUNTER — Telehealth: Payer: Self-pay | Admitting: Gastroenterology

## 2010-11-16 NOTE — Telephone Encounter (Signed)
Spoke with pts daughter. She states her mother's tongue is still swollen from where she had the yeast infection. Her speech is still some better but still somewhat slurred. She c/o a lymph node behind her ear that is sore. She also states that when she coughs she is having problems with nausea and vomiting. Daughter states she is not having any problems breathing. Please advise.

## 2010-11-16 NOTE — Telephone Encounter (Signed)
Spoke with pts daughter and she is aware of Dr. Ardell Isaacs recommendations. States she will contact the ENT.

## 2010-11-16 NOTE — Telephone Encounter (Signed)
Need to see her PCP or an ENT to further evaluate.

## 2010-11-22 ENCOUNTER — Ambulatory Visit: Payer: Medicare Other | Admitting: Gastroenterology

## 2010-11-25 ENCOUNTER — Emergency Department (HOSPITAL_COMMUNITY): Payer: Medicare Other

## 2010-11-25 ENCOUNTER — Inpatient Hospital Stay (HOSPITAL_COMMUNITY)
Admission: EM | Admit: 2010-11-25 | Discharge: 2010-12-03 | DRG: 074 | Disposition: A | Discharge status: Home Health | Payer: Medicare Other | Attending: Internal Medicine | Admitting: Internal Medicine

## 2010-11-25 DIAGNOSIS — R1312 Dysphagia, oropharyngeal phase: Secondary | ICD-10-CM | POA: Diagnosis present

## 2010-11-25 DIAGNOSIS — S78119A Complete traumatic amputation at level between unspecified hip and knee, initial encounter: Secondary | ICD-10-CM

## 2010-11-25 DIAGNOSIS — D509 Iron deficiency anemia, unspecified: Secondary | ICD-10-CM | POA: Diagnosis present

## 2010-11-25 DIAGNOSIS — D638 Anemia in other chronic diseases classified elsewhere: Secondary | ICD-10-CM | POA: Diagnosis present

## 2010-11-25 DIAGNOSIS — G527 Disorders of multiple cranial nerves: Principal | ICD-10-CM | POA: Diagnosis present

## 2010-11-25 DIAGNOSIS — I6529 Occlusion and stenosis of unspecified carotid artery: Secondary | ICD-10-CM | POA: Diagnosis present

## 2010-11-25 DIAGNOSIS — M069 Rheumatoid arthritis, unspecified: Secondary | ICD-10-CM | POA: Diagnosis present

## 2010-11-25 DIAGNOSIS — E785 Hyperlipidemia, unspecified: Secondary | ICD-10-CM | POA: Diagnosis present

## 2010-11-25 DIAGNOSIS — E876 Hypokalemia: Secondary | ICD-10-CM | POA: Diagnosis present

## 2010-11-25 DIAGNOSIS — I10 Essential (primary) hypertension: Secondary | ICD-10-CM | POA: Diagnosis present

## 2010-11-25 DIAGNOSIS — T394X5A Adverse effect of antirheumatics, not elsewhere classified, initial encounter: Secondary | ICD-10-CM | POA: Diagnosis present

## 2010-11-25 DIAGNOSIS — I658 Occlusion and stenosis of other precerebral arteries: Secondary | ICD-10-CM | POA: Diagnosis present

## 2010-11-25 DIAGNOSIS — Z6841 Body Mass Index (BMI) 40.0 and over, adult: Secondary | ICD-10-CM

## 2010-11-25 DIAGNOSIS — E119 Type 2 diabetes mellitus without complications: Secondary | ICD-10-CM | POA: Diagnosis present

## 2010-11-25 LAB — POCT CARDIAC MARKERS
CKMB, poc: 1.4 ng/mL (ref 1.0–8.0)
Myoglobin, poc: 169 ng/mL (ref 12–200)
Troponin i, poc: <0.05 ng/mL (ref 0.00–0.09)

## 2010-11-25 LAB — CBC
MCV: 81.8 fL (ref 78.0–100.0)
Platelets: 210 10*3/uL (ref 150–400)
RBC: 4.44 MIL/uL (ref 3.87–5.11)
WBC: 8.7 10*3/uL (ref 4.0–10.5)

## 2010-11-25 LAB — HEPATIC FUNCTION PANEL
ALT: 16 U/L (ref 0–35)
AST: 22 U/L (ref 0–37)
Bilirubin, Direct: <0.1 mg/dL (ref 0.0–0.3)
Total Bilirubin: 0.3 mg/dL (ref 0.3–1.2)

## 2010-11-25 LAB — BASIC METABOLIC PANEL
BUN: 14 mg/dL (ref 6–23)
Creatinine, Ser: 0.84 mg/dL (ref 0.4–1.2)
GFR calc non Af Amer: >60 mL/min (ref >60)
Glucose, Bld: 210 mg/dL — ABNORMAL HIGH (ref 70–99)
Potassium: 3.4 mEq/L — ABNORMAL LOW (ref 3.5–5.1)

## 2010-11-25 LAB — URINALYSIS, ROUTINE W REFLEX MICROSCOPIC
Bilirubin Urine: NEGATIVE
Ketones, ur: 15 mg/dL — AB
Nitrite: NEGATIVE
Protein, ur: NEGATIVE mg/dL
pH: 7 (ref 5.0–8.0)

## 2010-11-25 LAB — DIFFERENTIAL
Basophils Absolute: 0 10*3/uL (ref 0.0–0.1)
Basophils Relative: 0 % (ref 0–1)
Eosinophils Absolute: 0 10*3/uL (ref 0.0–0.7)
Lymphs Abs: 0.7 10*3/uL (ref 0.7–4.0)
Neutrophils Relative %: 88 % — ABNORMAL HIGH (ref 43–77)

## 2010-11-26 LAB — DIFFERENTIAL
Eosinophils Absolute: 0 10*3/uL (ref 0.0–0.7)
Eosinophils Relative: 0 % (ref 0–5)
Lymphs Abs: 1.1 10*3/uL (ref 0.7–4.0)
Monocytes Relative: 5 % (ref 3–12)

## 2010-11-26 LAB — CBC
MCH: 25.6 pg — ABNORMAL LOW (ref 26.0–34.0)
MCHC: 31.2 g/dL (ref 30.0–36.0)
MCV: 82 fL (ref 78.0–100.0)
Platelets: 227 10*3/uL (ref 150–400)
RDW: 15.4 % (ref 11.5–15.5)

## 2010-11-26 LAB — BASIC METABOLIC PANEL
BUN: 9 mg/dL (ref 6–23)
Creatinine, Ser: 0.68 mg/dL (ref 0.4–1.2)
GFR calc non Af Amer: >60 mL/min (ref >60)
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
## 2010-11-27 LAB — GLUCOSE, CAPILLARY
Glucose-Capillary: 154 mg/dL — ABNORMAL HIGH (ref 70–99)
Glucose-Capillary: 152 mg/dL — ABNORMAL HIGH (ref 70–99)

## 2010-11-27 LAB — URINE CULTURE: Colony Count: 50000

## 2010-11-28 ENCOUNTER — Observation Stay (HOSPITAL_COMMUNITY): Payer: Medicare Other

## 2010-11-28 ENCOUNTER — Inpatient Hospital Stay (HOSPITAL_COMMUNITY): Payer: Medicare Other

## 2010-11-28 LAB — CBC
MCH: 25.4 pg — ABNORMAL LOW (ref 26.0–34.0)
Platelets: 223 10*3/uL (ref 150–400)
RBC: 4.33 MIL/uL (ref 3.87–5.11)
RDW: 15.6 % — ABNORMAL HIGH (ref 11.5–15.5)

## 2010-11-28 LAB — DIFFERENTIAL
Basophils Relative: 0 % (ref 0–1)
Eosinophils Absolute: 0.1 10*3/uL (ref 0.0–0.7)
Monocytes Relative: 8 % (ref 3–12)
Neutrophils Relative %: 79 % — ABNORMAL HIGH (ref 43–77)

## 2010-11-28 LAB — GLUCOSE, CAPILLARY
Glucose-Capillary: 147 mg/dL — ABNORMAL HIGH (ref 70–99)
Glucose-Capillary: 117 mg/dL — ABNORMAL HIGH (ref 70–99)

## 2010-11-28 LAB — BASIC METABOLIC PANEL
Calcium: 9.2 mg/dL (ref 8.4–10.5)
Chloride: 100 mEq/L (ref 96–112)
Creatinine, Ser: 0.89 mg/dL (ref 0.4–1.2)
GFR calc Af Amer: >60 mL/min (ref >60)
GFR calc non Af Amer: >60 mL/min (ref >60)

## 2010-11-28 LAB — SEDIMENTATION RATE: Sed Rate: 45 mm/hr — ABNORMAL HIGH (ref 0–22)

## 2010-11-28 MED ORDER — GADOBENATE DIMEGLUMINE 529 MG/ML IV SOLN
20.0000 mL | Freq: Once | INTRAVENOUS | Status: AC | PRN
  Administered 2010-11-28: 20 mL via INTRAVENOUS

## 2010-11-29 LAB — GLUCOSE, CAPILLARY
Glucose-Capillary: 83 mg/dL (ref 70–99)
Glucose-Capillary: 97 mg/dL (ref 70–99)

## 2010-11-29 LAB — C-REACTIVE PROTEIN: CRP: 4.6 mg/dL — ABNORMAL HIGH (ref <0.6)

## 2010-11-30 LAB — GLUCOSE, CAPILLARY
Glucose-Capillary: 157 mg/dL — ABNORMAL HIGH (ref 70–99)
Glucose-Capillary: 92 mg/dL (ref 70–99)
Glucose-Capillary: 187 mg/dL — ABNORMAL HIGH (ref 70–99)
Glucose-Capillary: 187 mg/dL — ABNORMAL HIGH (ref 70–99)
Glucose-Capillary: 180 mg/dL — ABNORMAL HIGH (ref 70–99)

## 2010-12-01 ENCOUNTER — Other Ambulatory Visit: Payer: Self-pay | Admitting: Diagnostic Radiology

## 2010-12-01 LAB — DIFFERENTIAL
Basophils Absolute: 0 10*3/uL (ref 0.0–0.1)
Basophils Relative: 0 % (ref 0–1)
Lymphocytes Relative: 16 % (ref 12–46)
Monocytes Relative: 9 % (ref 3–12)
Neutro Abs: 6.1 10*3/uL (ref 1.7–7.7)
Neutrophils Relative %: 73 % (ref 43–77)

## 2010-12-01 LAB — BASIC METABOLIC PANEL
CO2: 31 mEq/L (ref 19–32)
Chloride: 99 mEq/L (ref 96–112)
GFR calc Af Amer: >60 mL/min (ref >60)
Glucose, Bld: 125 mg/dL — ABNORMAL HIGH (ref 70–99)
Sodium: 139 mEq/L (ref 135–145)

## 2010-12-01 LAB — GLUCOSE, CAPILLARY
Glucose-Capillary: 157 mg/dL — ABNORMAL HIGH (ref 70–99)
Glucose-Capillary: 144 mg/dL — ABNORMAL HIGH (ref 70–99)
Glucose-Capillary: 165 mg/dL — ABNORMAL HIGH (ref 70–99)

## 2010-12-01 LAB — CBC
HCT: 32.5 % — ABNORMAL LOW (ref 36.0–46.0)
Hemoglobin: 10 g/dL — ABNORMAL LOW (ref 12.0–15.0)
MCH: 25.1 pg — ABNORMAL LOW (ref 26.0–34.0)
RBC: 3.98 MIL/uL (ref 3.87–5.11)

## 2010-12-02 ENCOUNTER — Inpatient Hospital Stay (HOSPITAL_COMMUNITY): Payer: Medicare Other

## 2010-12-02 LAB — CSF CELL COUNT WITH DIFFERENTIAL
RBC Count, CSF: 3 /mm3 — ABNORMAL HIGH
Tube #: 2
WBC, CSF: 3 /mm3 (ref 0–5)

## 2010-12-02 LAB — GLUCOSE, CAPILLARY: Glucose-Capillary: 143 mg/dL — ABNORMAL HIGH (ref 70–99)

## 2010-12-02 LAB — FERRITIN: Ferritin: 138 ng/mL (ref 10–291)

## 2010-12-02 LAB — IRON AND TIBC: Saturation Ratios: 8 % — ABNORMAL LOW (ref 20–55)

## 2010-12-02 LAB — VITAMIN B12: Vitamin B-12: 668 pg/mL (ref 211–911)

## 2010-12-03 LAB — CBC
HCT: 31.2 % — ABNORMAL LOW (ref 36.0–46.0)
MCV: 83.9 fL (ref 78.0–100.0)
Platelets: 200 10*3/uL (ref 150–400)
RBC: 3.72 MIL/uL — ABNORMAL LOW (ref 3.87–5.11)
WBC: 6.8 10*3/uL (ref 4.0–10.5)

## 2010-12-03 LAB — DIFFERENTIAL
Lymphocytes Relative: 25 % (ref 12–46)
Lymphs Abs: 1.7 10*3/uL (ref 0.7–4.0)
Neutrophils Relative %: 65 % (ref 43–77)

## 2010-12-03 LAB — BASIC METABOLIC PANEL
BUN: 10 mg/dL (ref 6–23)
Chloride: 107 mEq/L (ref 96–112)
Glucose, Bld: 103 mg/dL — ABNORMAL HIGH (ref 70–99)
Potassium: 5 mEq/L (ref 3.5–5.1)
Sodium: 141 mEq/L (ref 135–145)

## 2010-12-03 LAB — GLUCOSE, CAPILLARY: Glucose-Capillary: 113 mg/dL — ABNORMAL HIGH (ref 70–99)

## 2010-12-03 LAB — CRYPTOCOCCAL ANTIGEN, CSF: Crypto Ag: NEGATIVE

## 2010-12-03 NOTE — Group Therapy Note (Signed)
  [name], [name]          ACCOUNT NO.:  [id number]  MEDICAL RECORD NO.:  [id number]           PATIENT TYPE:  LOCATION:                                 FACILITY:  PHYSICIAN:  Margo Lama A. [name], M.D. DATE OF BIRTH:  Oct 26, 1927  DATE OF PROCEDURE: DATE OF DISCHARGE:                                PROGRESS NOTE   ADDENDUM  The patient has been on Plavix.  Radiologist want to hold this for 5 days before proceeding with the spinal tap under fluoroscopic guidance. The patient reports that she is overall about the same.  She did have a swallowing study showing difficulty swallowing with certain consistencies.  PHYSICAL EXAMINATION:  VITAL SIGNS:  Temperature 97.9, pulse 57, respiratory rate 20, blood pressure 136/74. HEENT EVALUATION:  Neck is supple.  Head is normocephalic, atraumatic. GENERAL:  She is awake.  She does have significant dysarthria and hypophonia. CRANIAL NERVE EVALUATION:  Pupils are reactive.  Visual fields are full. Extraocular movements are full.  Facial muscle strength symmetric.  On more detailed examination, she does appear to have possibly mild atrophy in the left side of the tongue and the tongue does mildly deviates to the left.  Uvula appears midline.  Again, facial muscle strength is symmetric. MOTOR EXAM:  Shows good strength in upper extremities bilaterally.  After evaluation, C-reactive protein is higher 4.6, sed rate 45, WBC 11, hematocrit 35.7, platelet count is 223.  MRI is reviewed in person and shows no acute infarcts.  There is marked confluent leukoencephalopathy consistent with chronic micro-ischemic changes, again nothing acute. MRA shows no large vessel intracranial occlusive disease.  ASSESSMENT AND PLAN:  [name] who presents with what appears to be multiple cranial neuropathies clinically with cranial nerve XII, being affected possibly IX and V.  Spinal tap will be pursued once she has been off of Plavix for 5 days.  Continue to  follow the patient.     Shaylynne Lunt A. [name], M.D.     KAD/MEDQ  D:  11/29/2010  T:  11/29/2010  Job:  [id number]  Electronically Signed by [name] M.D. on 12/03/2010 08:28:02 AM

## 2010-12-03 NOTE — Consult Note (Signed)
NAMESTEPHENIA, [name]          ACCOUNT NO.:  [id number]  MEDICAL RECORD NO.:  [id number]           PATIENT TYPE:  O  LOCATION:  A315                          FACILITY:  [facility]  PHYSICIAN:  Shayan Bramhall A. [name], M.D. DATE OF BIRTH:  01-Oct-1927  DATE OF CONSULTATION:  11/28/2010 DATE OF DISCHARGE:                                CONSULTATION   HISTORY OF PRESENT ILLNESS:  This is an 77 year old black female who presents with almost a 18-month history of progressive dysarthria, dysphagia, nausea, vomiting and some odynophagia.  She initially presented to the hospital last month because of significant odynophagia and dysphagia.  She was diagnosed to having oral candidiasis.  She was evaluated by ENT and had EGD, which essentially showed evidence of the candidiasis, but otherwise unremarkable.  She continues to have progressive symptoms.  She also has had facial pain on the left side, which was her initial presentation about 2 months ago.  The entire V1, V2, and V3 regions are involved.  No fevers, no clear focal weakness and numbness.  She has had evaluation by ENT who suggested that she has normal discs, although he did report that she did have some mild vocal cord swelling.  She has also had CT scan of the brain, which has been unremarkable.  The patient was seen by me a year ago for vertiginous symptoms, which was thought to be due to ischemic events, however, MRI, MRA showed no acute infarcts.  She did have some luminal irregularities throughout but no clear focal high-grade stenosis of the intracranial vessels.  PAST MEDICAL HISTORY:  She has had infarct in the past.  She presented here again with vertiginous symptoms, history of non-Hodgkin lymphoma, hypertension, diabetes, rheumatoid arthritis.  She had traumatic amputation of the right leg below the knee, status post coronary artery stenting of coronary disease.  MEDICATIONS:  Prednisone, pravastatin, Plavix, Janumet, aspirin,  and Tribenzor.  SOCIAL HISTORY:  Lives with her husband.  She has two children.  No tobacco, illicit drug use or alcohol use.  ALLERGIES:  None.  REVIEW OF SYSTEMS:  As stated in HPI, otherwise negative.  Again, she does not report any travel outside the country.  No recent fevers.  PHYSICAL EVALUATION:  GENERAL:  An obese pleasant lady in no acute distress. VITAL SIGNS:  Temperature 98.2, pulse 62, respirations 16, blood pressure 175/73. HEENT:  Does have some mild soreness in the left temporal area.  Head is normocephalic, atraumatic.  I did look into the left ear because of some left ear pain.  TMJ looked fine.  No external canal pathology. NECK:  Supple. ABDOMEN:  Obese but soft. EXTREMITIES:  Below-knee amputation on the right.  She has a large incision scar, left knee. MENTATION:  She is awake and alert.  She is generally lucid.  She has marked hypophonia/dysarthria.  Cranial nerve evaluation shows normal sensation to the light touch in the facial region.  Pupils are equal, round, and reactive to light.  Visual fields are intact.  Extraocular movements are full.  Tongue is midline.  Uvula is also midline. Shoulder shrug is normal.  Motor examination shows mild downward drift on  the left, although she has pretty good strength on direct testing. Strength of the legs are normal.  Bulk and tone are normal throughout. Coordination shows no dysmetria.  There is no tremors.  No parkinsonisms are noted.  Reflexes are diminished on the left knee, suspect this is due to a knee replacement surgery, otherwise reflexes are 2+ throughout. Plantars are downgoing on the left.  Sensation is normal to light touch and temperature.  CT scan shows nothing acute or chronic ischemic changes.  Chemistry is significant for potassium 2.4, otherwise unremarkable.  ASSESSMENT:  A lady who presents with somewhat unusual constellation of symptoms being serious and progressive nature, argues against  an ischemic process suggestion of multiple cranial neuropathy suggests a process in the cerebrospinal fluid suggestive of chronic meningitis feature.  She does not have evidence of Parkinsonism or dementia and to suggest multisystem atrophy or other neurodegenerative hypertrophies.  RECOMMENDATIONS:  She does have a MRI, which has been ordered.  We are going to add sed rate and C-reactive protein particularly given there is some scalp tenderness on the right side to evaluate possible arthritis, mostly we are going to suggest a spinal tap.     Ryle Buscemi A. [name], M.D.     KAD/MEDQ  D:  11/28/2010  T:  11/28/2010  Job:  [id number]  Electronically Signed by [name] M.D. on 12/03/2010 08:27:57 AM

## 2010-12-03 NOTE — Group Therapy Note (Signed)
  [name], [name]          ACCOUNT NO.:  [id number]  MEDICAL RECORD NO.:  [id number]           PATIENT TYPE:  I  LOCATION:  A315                          FACILITY:  [facility]  PHYSICIAN:  Yaslin Kirtley A. [name], M.D. DATE OF BIRTH:  07-29-1928  DATE OF PROCEDURE: DATE OF DISCHARGE:                                PROGRESS NOTE   She reports that she continues to feel improvement in her symptoms, still has significant dysarthria and sore throat.  PHYSICAL EXAMINATION:  VITAL SIGNS:  Temperature 98.5, pulse 67, respirations 20, blood pressure 132/62. GENERAL:  She is awake and alert.  Again, she has significant dysarthria and hypophonia. NECK:  Supple. HEENT:  Head is normocephalic, atraumatic.  Pupils are reactive. Extraocular movements are full.  Facial muscle strength is symmetric and normal.  Tongue deviates slightly to the left.  She does seem to have atrophy of the left anterior tip of the tongue. MUSCULOSKELETAL:  Motor examination shows normal tone, bulk, and strength of the upper extremities.  ASSESSMENT AND PLAN:  This lady was suspected of having cranial neuropathy, it is unclear etiology.  A spinal tap is being arranged when she has been off the Plavix for 5 days.     [name] A. [name], M.D.     KAD/MEDQ  D:  12/01/2010  T:  12/01/2010  Job:  [id number]  Electronically Signed by [name] M.D. on 12/03/2010 08:28:05 AM

## 2010-12-05 NOTE — Group Therapy Note (Signed)
[name], [name]          ACCOUNT NO.:  [id number]  MEDICAL RECORD NO.:  [id number]           PATIENT TYPE:  LOCATION:                                 FACILITY:  PHYSICIAN:  Saim Almanza L. Lendell Caprice, MDDATE OF BIRTH:  February 27, 1928  DATE OF PROCEDURE:  11/29/2010 DATE OF DISCHARGE:                                PROGRESS NOTE   Over the past 24 hours, [name] had a difficult time with the Dobbhoff.  After many attempts one was placed but it accidently got dislodged, and she has despite multiple attempts has been unable to have one placed again.  I have started her back on her nectar thickened liquids and pureed diet.  SUBJECTIVE:  The patient reports that her speech and her swallowing is unchanged.  Her left-sided facial pain continues to improve.  She has had no return of her vertigo or nausea.  OBJECTIVE:  VITAL SIGNS:  Temperature is 97.9, pulse 66, respiratory rate 20, blood pressure 136/74, oxygen saturation 93% on room air.  Exam is unchanged from November 28, 2010.  CBGs have been running in the 76 to 200 range.  She has no labs for today.  ASSESSMENT/PLAN: 1. Symptom complex of severe oropharyngeal dysphasia with left-sided     facial pain through V1, V2, and V3, dysphonia, right-sided tongue     atrophy and weakness, mild right vocal cord weakness and bilateral     vocal cord edema, with resolved vertigo and nausea:  Her workup     thus far includes an EGD which only showed thrush.  ENT evaluation,     results as above.  MRI of the brain, MRA of the head and neck,     modified barium swallow:  Dr. [name] would like to wait on the     lumbar puncture and would not order any further workup at this     time.  She may have an indolent meningitis of some type with     multiple cranial nerve involvements.  I questioned him whether this     could be potentially myasthenia gravis.  He does not think this     would be the case.  I also discussed the case with Dr.  [name],     the patient's rheumatologist.  According to literature review,     Leflunomide can cause paresthesias and mainly peripheral     neuropathies.  He has never seen a case of cranial nerve     involvement but agrees that stopping the [place] would be reasonable.     It has a long half life and requires cholestyramine 8 grams p.o.     t.i.d. for 11 days to help with clearance.  I will order this.  He     also reports that very rarely, rheumatoid arthritis can affect, can     cause various neuropathies.  The patient's facial pain and nausea     and vertigo seemed to improve with the addition of carbamazepine.     Dr. [name] recommends continuing this for now.  She may have more     than one etiology to her various symptoms.  The patient will     continue on dysphagia I diet with nectar thickened liquids as she     was unable to get a Dobbhoff successfully placed for any length of     time.  I will order a calorie count and if adequate caloric intake     is problematic, we can conceivably start TPN if her swallowing does     not improve. 2. Diabetes.  Continue sliding scale. 3. Hypertension.  I will stop the hydrochlorothiazide for now as her     intake will be spotty. 4. Rheumatoid arthritis, longstanding. 5. Hyperlipidemia. 6. Coronary artery disease and history of stroke:  The Plavix and     aspirin has been held for the lumbar puncture. 7. Morbid obesity. 8. Traumatic right below-knee amputation. 9. History of lymphoma, in remission.     Dorri Ozturk L. Lendell Caprice, MD     CLS/MEDQ  D:  11/29/2010  T:  11/30/2010  Job:  [id number]  Electronically Signed by Crista Curb MD on 12/05/2010 08:06:53 AM

## 2010-12-05 NOTE — Discharge Summary (Signed)
[name], [name]          ACCOUNT NO.:  [id number]  MEDICAL RECORD NO.:  [id number]           PATIENT TYPE:  I  LOCATION:  A301                          FACILITY:  [facility]  PHYSICIAN:  Elliot Cousin, M.D.    DATE OF BIRTH:  1927-08-19  DATE OF ADMISSION:  11/25/2010 DATE OF DISCHARGE:  05/05/2012LH                              DISCHARGE SUMMARY   DISCHARGE DIAGNOSES: 1. Sign symptom complex consisting of progressive oropharyngeal     dysphagia, left-sided facial pain through V1, V2, and V3,     dysphonia, right-sided tongue atrophy and weakness, mild right     vocal cord weakness, bilateral vocal cord edema, and vertigo.  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 2. [name] was discontinued due to its potential side effect of causing     peripheral neuropathy. 3. Status post fluoroscopic guided lumbar puncture on Dec 02, 2010.     The results revealed CSF protein 63 (15-45 within normal     range), CSF glucose 99, CSF white blood cell 3, 40-80%     lymphocytes, and 15-45% monocytes, CSF red blood cells 3. 4. Cerebral atrophy, moderate to advanced intracranial atherosclerotic     disease, and 50% stenosis of the proximal right internal carotid     artery, 50% stenosis of the proximal left internal carotid artery,     and 60% diameter stenosis of the mid to distal cervical internal     carotid artery on the left per MRI of the brain and MRA of the     brain and neck. 5. Recent diagnosis of oral candidiasis per EGD by Dr. [name]     in March 2012. 6. Right tongue atrophy, right vocal cord weakness, and bilateral     vocal cord edema per ENT, Dr. [name]' evaluation in early March     2012. 7. Type 2 diabetes mellitus. 8. Hypertension. 9. Normocytic anemia.  The patient's hemoglobin was 9.6 prior to     hospital discharge.  Her total iron was 18, TIBC 226, percent     saturation 8, ferritin 134, and vitamin  B12 668.  It is likely that     the patient has anemia of chronic disease due to rheumatoid     arthritis.  Mild iron deficiency is also possible. 10.History of longstanding rheumatoid arthritis. 11.History of previous stroke. 12.Hodgkin's lymphoma diagnosed in 1998, in remission. 13.Coronary artery disease, status post stent placement in the past. 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 15.Status post left ankle replacement. 16.Status post left knee replacement. 17.Status post hysterectomy. 18.Hyperlipidemia.  DISCHARGE MEDICATIONS: 1. Carbamazepine 100 mg b.i.d. with meals (new medication). 2. Cholestyramine 8 g three times daily through Dec 10, 2010 (new     medication). 3. Ensure 1 can three times daily. 4. Meclizine 12.5 mg 1 tablet every 6 hours as needed for dizziness     (new medication). 5. Prilosec 20 mg daily (new medication). 6. MiraLax laxative 17 grams daily. 7. Robitussin DM one teaspoon every 4 hours as  needed for cough. 8. Aspirin 81 mg daily. 9. Janumet 50/1000 mg 1 tablet daily. 10.Multivitamin with iron once daily. 11.Plavix 75 mg daily. 12.Pravastatin 40 mg nightly. 13.Prednisone 5 mg daily. 14.Tribenzor 40/10/12.5 mg once daily. 15.Stop Leflunomide (Arava).  DISCHARGE DISPOSITION:  The patient was discharged to home in improved and stable condition on Dec 03, 2010.  Appointments will be made for her to follow up with her primary care physician [name], neurologist [name], and her rheumatologist [name].  An outpatient followup modified barium swallow will be scheduled in the near future.  CONSULTATIONS: 1. Kofi A. [name], MD 2. Interventional radiologist Dr. Juan Quam. 3. Speech therapy.  PROCEDURE PERFORMED: 1. Fluoroscopic guided lumbar puncture by Dr. Juan Quam on Dec 02, 2010. 2. MRA of the brain and neck and MRI of the brain on November 28, 2010.     The results  were dictated above. 3. CT scan of the head on November 25, 2010.  The results revealed no     acute intracranial abnormalities.  Stable moderate deep atrophy and     moderate chronic microvascular ischemic changes of the white matter     diffusely. 4. Chest x-ray on November 25, 2010.  The results revealed stable mild     cardiomegaly but no active lung disease.  HISTORY OF PRESENTING ILLNESS:  The patient is an 77 year old woman with a past medical history significant for a previous stroke, coronary artery disease with stent placement, rheumatoid arthritis, Hodgkin lymphoma in remission, and type 2 diabetes mellitus.  She presented to the hospital on November 25, 2010, with a chief complaint of headache, left- sided facial pain, difficulty swallowing, hoarseness, dizziness, and difficulty clearing secretions from her throat.  In the emergency department, she was noted to be mildly hypertensive but otherwise afebrile and hemodynamically stable.  The CT scan of her head revealed chronic diffuse microvascular ischemia.  Her chest x-ray revealed no active lung disease.  Her hemoglobin was 11.2.  Her cardiac markers were negative.  Her serum sodium potassium was 3.4.  Her urinalysis was essentially negative.  Her hepatic function panel was within normal limits.  She was admitted for further evaluation and management.  HOSPITAL COURSE: 1. SIGN SYMPTOM COMPLEX.  The patient was started on symptomatic     treatment with as-needed Tylenol for pain, p.r.n. meclizine and a     scopolamine patch for dizziness, and carbamazepine for treatment of     facial pain in the trigeminal distribution.  Gentle IV fluids were     started.  The speech therapist was consulted for further evaluation     given the patient's obvious oropharyngeal dysphagia.  Following     Dabney's evaluation, a pureed diet with thickened liquids was     ordered.  However, the patient had difficulty tolerating this diet     initially.   Therefore a Dobbhoff feeding tube was to be     placed.  After many attempts, insertion was unsuccessful.  The     pureed diet and nectar thickened liquids were restarted, hoping     that the patient would not actively aspirate.  Dr. Lendell Caprice     obtained records from the patient's recent evaluation by the ENT     Dr. [name].  The results of his evaluation were dictated above.     Subsequently, neurologist Dr. [name] was consulted.  Per his     initial evaluation, he believed that the patient  may have a     multiple cranial nerve neuropathy, inciting etiology     unknown. He did not believe she had an ischemic event and there     was no evidence of parkinsonism or dementia or multisystem atrophy     or any other neurodegenerative hypertrophies.  Number of studies     were ordered for further evaluation.  The results of her brain MRI     and brain/neck MRA were dictated above.  Her sed rate was slightly     elevated at 45.  Her C-reactive protein was elevated at 4.6.  Her     vitamin B12 was within normal limits at 668.  Dr. [name] ordered     a lumbar puncture for further evaluation.  After Plavix had been     held for 5 days, the lumbar puncture was performed successfully on     Dec 02, 2010.  The results so far were dictated above.  Cryptococcal     antigen was negative.  The exact etiology the patient's sign     symptom complex was unknown.  However, the working diagnoses was     that the patient had a multiple cranial nerve neuropathy of unknown     etiology.  Her cerebral spinal fluid protein level was elevated.     The significance of this elevation was unclear.  She was evaluated     by the physical therapist who assisted with ambulation.  The     patient appeared to have had no motor weakness in her periphery but     only manifestations of cranial nerve weakness.  The speech     therapist evaluated the patient again a couple of days ago, prior     to discharge, and recommended  that the patient remain on dysphagia     I with nectar thickened liquids.  Because the patient was     discharged to home during the weekend, an outpatient modified     barium swallow will be arranged to assess for interval improvement.     In the meantime, both the patient and the patient's family were     instructed on aspiration precautions.  Of note, Dr. Lendell Caprice did review the potential side effects of Leflunomide which the patient had taken for treatment of rheumatoid arthritis.  Apparently, this medication can cause paresthesias and peripheral neuropathies.  She discussed stopping this medication with her primary rheumatologist [name].  [name] had never seen a case of cranial nerve involvement but agreed that stopping the Leflunomide was reasonable.  Because the medication has a long half life, it required cholestyramine t.i.d. for 11 days to help with clearance.  Cholestyramine was started which the patient appeared to tolerate well.  She was discharged home on 7 more days of therapy through Dec 10, 2010.  2. TYPE 2 DIABETES MELLITUS.  The patient's capillary blood glucose     generally fell below 200.  She was continued on Janumet; sliding     scale NovoLog was added. 3. HYPERTENSION.  The patient's systolic blood pressure was generally     within normal limits, although her systolic blood pressure did     increase to the 150 to 160s periodically.  She was maintained on     amlodipine and olmesartan but hydrochlorothiazide was withheld     during gentle hydration.  Upon discharge, she was advised to     restart Tribenzor (olmesartan, amlodipine, and  hydrochlorothiazide). 4. NORMOCYTIC ANEMIA.  The patient's hemoglobin ranged from 11.2 on     admission to 9.6 at the time of hospital discharge.  Anemia studies     were ordered.  The results were dictated above.  She was initially     started on Nu-Iron once daily.  However, she had difficulty     swallowing  this medication.  Therefore, she was advised to take a     multivitamin with iron once daily.  DISCHARGE LABORATORY:  Sodium 141, potassium 5.0, chloride 107, CO2 of 27, glucose 103, BUN 10, creatinine 0.61, calcium 9.4, WBC 6.8, hemoglobin 9.6, platelet count 200.     Elliot Cousin, M.D.     DF/MEDQ  D:  12/03/2010  T:  12/04/2010  Job:  [id number]  cc:   Margaretmary [name], M.D. Fax: [phone]  Kofi A. [name], M.D. Fax: [phone]  Rachael Fee, MD [address] [place], [place] [address]  Venita Lick. Russella Dar, MD, FACG [address] [place] [address]  Electronically Signed by Elliot Cousin M.D. on 12/05/2010 04:51:17 PM

## 2010-12-06 ENCOUNTER — Other Ambulatory Visit (HOSPITAL_COMMUNITY): Payer: Self-pay | Admitting: Internal Medicine

## 2010-12-06 LAB — GLUCOSE, CAPILLARY
Glucose-Capillary: 111 mg/dL — ABNORMAL HIGH (ref 70–99)
Glucose-Capillary: 136 mg/dL — ABNORMAL HIGH (ref 70–99)

## 2010-12-08 ENCOUNTER — Ambulatory Visit (HOSPITAL_COMMUNITY)
Admission: RE | Admit: 2010-12-08 | Discharge: 2010-12-08 | Disposition: A | Discharge status: Home / Self Care | Payer: Medicare Other | Source: Ambulatory Visit | Attending: Internal Medicine | Admitting: Internal Medicine

## 2010-12-08 DIAGNOSIS — R1310 Dysphagia, unspecified: Secondary | ICD-10-CM | POA: Insufficient documentation
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
## 2010-12-13 NOTE — Procedures (Signed)
[name], [name] NO.:  [id number]   MEDICAL RECORD NO.:  [id number]          PATIENT TYPE:  OUT   LOCATION:  SLEEP LAB                     FACILITY:  [facility]   PHYSICIAN:  Barbaraann Share, MD,FCCPDATE OF BIRTH:  28-Jan-1928   DATE OF STUDY:  02/24/2008                            NOCTURNAL POLYSOMNOGRAM   REFERRING PHYSICIAN:   LOCATION:  Sleep lab.   REFERRING PHYSICIAN:  Erle Crocker, M.D.   EPWORTH SLEEPINESS SCORE:  10.   MEDICATIONS:   SLEEP ARCHITECTURE:  The patient had a total sleep time of 326 minutes  with adequate slow wave sleep for age but decreased REM.  Sleep onset  latency was normal, and REM onset was mildly prolonged.  Sleep  efficiency was decreased at 80%.   RESPIRATORY DATA:  The patient was found to have 2 obstructive apneas  and 10 hypopneas for an AHI of 2 events per hour.  The events were not  positional and there was loud snoring noted throughout.  The events did  occur primarily during REM.  The patient did not meet split-night  criteria secondary to the small numbers of events.   OXYGEN DATA:  There was transient O2 desaturation as low as 88% with the  patient's obstructive events.   CARDIAC DATA:  No clinically significant arrhythmias were noted.   MOVEMENT/PARASOMNIA:  The patient was found to have 54 leg jerks with 2  per hour resulting in arousal or awakening.  This may be clinically  significant.  There were no abnormal behaviors.   IMPRESSION/RECOMMENDATIONS:  1. Small numbers of obstructive events which do not meet the apnea      hypopnea index criteria for obstructive sleep apnea syndrome.      There was only transient O2 desaturation associated with the events      during rapid eye movement.  2. Moderate numbers of leg jerks with mild sleep disruption.  Clinical      correlation is suggested to see if the patient's history is      consistent with restless leg syndrome.      Barbaraann Share, MD,FCCP  Diplomate, American Board of Sleep  Medicine  Electronically Signed     KMC/MEDQ  D:  03/10/2008 15:52:02  T:  03/10/2008 16:53:40  Job:  [id number]

## 2010-12-13 NOTE — Discharge Summary (Signed)
NAMEFALLEN, CRISOSTOMO NO.:  [id number]   MEDICAL RECORD NO.:  [id number]          PATIENT TYPE:  INP   LOCATION:  A219                          FACILITY:  [facility]   PHYSICIAN:  Dorris Singh, DO    DATE OF BIRTH:  [birth year]   DATE OF ADMISSION:  06/13/2007  DATE OF DISCHARGE:  11/17/2008LH                               DISCHARGE SUMMARY   ADMISSION DIAGNOSES:  1. Near syncopal episodes.  2. 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  3. Diabetes.  4. Hypertension.   DISCHARGE DIAGNOSES:  1. She had a cerebrovascular accident with multifocal subcentimeter      acute infarcts in the right inferior cerebellum.  2. Hypertension.  3. Diabetes.  4. Hyperlipidemia.   PRIMARY CARE PHYSICIAN:  Dr. Jen Mow.   CONSULTATIONS:  Dr. [name] to see her.   She had several studies done.  1. She had a carotid Doppler done which showed a tortuous carotid      systems with minimal plaque formation, elevated peak systolic      velocity, NICA/CCA ratio at right ICA though visible plaque only      appears to be of mild degree.  The velocities obtained correspond      to a 50-60% diameter stenosis of the right ICA __________  felt      this might be over stated due to the tortuosity of the carotid      arteries.  2. An MRI of the brain which demonstrated multifocal subcentimeter      acute infarcts within the right inferior cerebellum.  This is      likely to be 85-12 weeks of age.  Mid atrophy of the small vessel      disease.  Mild pannus surrounds the dens and no intracranial      hemorrhage or mass effect.  3. MRA of the head on November 15th, which showed no significant      posterior circulation disease could be seen in the right vertebral      artery or proximal PICA.   Her H&P was done by Dr. Lilian Kapur.  To summarize, this is a 77 year old  [place] female who presented with a 1-week history of syncope.  She had some near syncopal episodes and said she  was having some  difficulty with walking.  She went to her primary care physician's  office a few days ago and was told that if these episodes continued to  go to the emergency room.  In the emergency room she had several tests  done.  She was admitted to the service of [facility] with the diagnosis  of near syncopal episodes and she did have a CT done which was reviewed,  however, due to the patient's concern went ahead and admitted her for  evaluation, also has a history of diabetes and hypertension.  While she was here, we consulted Dr. [name] to come and see her.  An  MRA was ordered which showed a right inferior cerebral infarction.  She  was started on Plavix, and she was monitored throughout her stay, did  not have any  repeat episodes.  On the 17th, it was determined she was  stable enough and she will follow up with Dr. Virgilio Belling office at discharge  and with Dr. [name] both within one to two weeks.  We also recommended  cardiology followup as well which can be set up through Dr. Jen Mow.   She will be sent home on:  1. Plavix 75 mg p.o. daily.  2. Nu-Iron 150 mg p.o. daily as well as her regular medications which      are:  3. Actos 30 mg, one tab daily.  4. Lisinopril 40 mg, one tab once a day.  5. Furosemide 40 mg, one tab once a day.  6. Simvastatin 40 mg, one tab p.o. once a day.  7. Aspirin 81 mg.  8. Multivitamin one tab p.o. daily.   CONDITION AT DISCHARGE:  Stable.   DISPOSITION:  To home with followup which we will make for her within  this time frame.  The patient is instructed to follow up if any other  symptoms reoccur.      Dorris Singh, DO  Electronically Signed     CB/MEDQ  D:  06/17/2007  T:  06/17/2007  Job:  [id number]   cc:   Dr. Jen Mow
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
## 2010-12-13 NOTE — Group Therapy Note (Signed)
[name], KROGH NO.:  [id number]   MEDICAL RECORD NO.:  [id number]          PATIENT TYPE:  INP   LOCATION:  A219                          FACILITY:  [facility]   PHYSICIAN:  Dorris Singh, DO    DATE OF BIRTH:  April 22, 1928   DATE OF PROCEDURE:  06/14/2007  DATE OF DISCHARGE:                                 PROGRESS NOTE   Patient seen today.  Discussed findings with her regarding her MRI.  She  had seen Dr. [name] last night regarding her old CVA of 1-2 weeks of  age, that is approximately when her symptoms started to happen.  He has  ordered another MRI for today.  Await pending results on that and will  continue to monitor regarding that.   CURRENT VITALS:  Temperature 97.9, pulse 59, respirations 20, and blood  pressure 139/59.  GENERAL:  This is a 77 year old African-American female who is well-  developed and well-nourished in no acute distress.  Heart rate is a regular rhythm.  S1 and S2.  LUNGS:  Clear to auscultation bilaterally.  No rales, wheezes, or  rhonchi.  ABDOMEN:  Soft, nontender, nondistended.  Positive bowel sounds in all  four quadrants.  EXTREMITIES:  She has a right below the knee amputation.  Left  extremity:  No clubbing, cyanosis or edema.   LABS:  Will be ordered for her first thing in the morning.   ASSESSMENT/PLAN:  1. Cerebrovascular accident, right inferior cerebral infarct as well      as a distribution of posterior inflow cerebral artifact as well.      This could be attributed to her near-syncopal episodes.  2. Diabetes:  Management is currently being controlled on      hypertension.  This has been stable.  Await further testing and her      current MRI.  Patient was also started on aspirin.  While she has      been here, she has been placed on Lovenox subcu as well.  She was      also started on Plavix by Dr. [name].  There is some blood work      that is also ordered.  We will continue to monitor patient.      Dorris Singh, DO  Electronically Signed     CB/MEDQ  D:  06/15/2007  T:  06/15/2007  Job:  [id number]
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
## 2010-12-13 NOTE — Group Therapy Note (Signed)
[name], [name] NO.:  [id number]   MEDICAL RECORD NO.:  [id number]          PATIENT TYPE:  INP   LOCATION:  A219                          FACILITY:  [facility]   PHYSICIAN:  Skeet Latch, DO    DATE OF BIRTH:  1927-10-03   DATE OF PROCEDURE:  06/16/2007  DATE OF DISCHARGE:                                 PROGRESS NOTE   SUBJECTIVE:  The patient seems to be doing well today. She denies any  syncopal episodes, dizziness, light headedness, chest pain, nausea,  vomiting, or diarrhea today. At this time, the patient is stable with no  complaints.   VITAL SIGNS:  Temperature 97.5, pulse 73, respirations 22, blood  pressure 147/73, O2 saturation 96%.   PHYSICAL EXAMINATION:  GENERAL:  This is a 77 year old African-American  female __________ no acute distress.  CARDIOVASCULAR:  Regular rate and rhythm, S1, S2. No murmurs  appreciated.  LUNGS:  Clear. No rales, rhonchi, or wheezing.  ABDOMEN:  Soft and nontender. Nondistended. Positive bowel sounds.  EXTREMITIES:  She has a right below the knee amputation. No cyanosis,  clubbing, or edema.   LABORATORY DATA:  Sodium 140, potassium 4.3, chloride 106, CO2 29,  glucose 128, BUN 18, creatinine 0.89, white count 5.2, hemoglobin 10.7,  hematocrit 32.2, platelets 172.   RADIOLOGIC STUDIES:  MRA of her head showed that no significant  posterior circulation disease could be seen in the vertebral artery or  proximal PICA.   ASSESSMENT AND PLAN:  1. 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 Neurology continues to follow the patient. The patient      has been placed on aspirin and Plavix. This will be continued at      this time. The patient does not seem to have any residual effects      from this infarction at this time and she continues to ambulate      well.  2. Diabetes. The patient continues to be on a sliding scale. We will      continue at this time.  3. Hypertension. This  seems to be fairly controlled at this time. We      will continue to monitor and adjust as needed.   We will wait on neurology recommendations regarding when patient can be  discharged. The patient may need rehab, but the patient does not seem to  have any effects, and depending on neurologic consultation, we  anticipate the patient being discharged in the next 24 to 48 hours.     Skeet Latch, DO  Electronically Signed    SM/MEDQ  D:  06/16/2007  T:  06/17/2007  Job:  [id number]
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
## 2010-12-23 NOTE — Discharge Summary (Signed)
  [name], [name] NO.:  [id number]  MEDICAL RECORD NO.:  [id number]           PATIENT TYPE:  I  LOCATION:  1502                         FACILITY:  [facility]  PHYSICIAN:  Rachael Fee, MD   DATE OF BIRTH:  1928-07-01  DATE OF ADMISSION:  10/19/2010 DATE OF DISCHARGE:  10/21/2010                              DISCHARGE SUMMARY   PRIMARY GASTROENTEROLOGIST:  Judie Petit T. Russella Dar, MD, Clementeen Graham  DISPOSITION:  Home in stable condition.  DISCHARGE MEDICATIONS:  The patient will continue her home medications including: 1. Prednisone 5 mg two tablets daily. 2. Janumet 50/1000 mg one tablet daily. 3. Pravastatin 40 mg daily. 4. Multiple vitamin daily. 5. Hydrocodone elixir 7.5/325 15 mL every 8 hours as needed. 6. Aspirin 81 mg daily. 7. Plavix 75 mg daily. 8. Arava 20 mg one tablet daily. 9. Tribenzor 40/10/12.5 one tablet daily. 10.In addition, the patient will complete 6 days of Diflucan 100 mg    daily. 11.She will also use Magic Mouthwash 5 mL t.i.d. for 6 days.  CONSULTATIONS:  None requested.  PROCEDURES PERFORMED:  The patient had an EGD by Dr. [name] which was normal except for oral thrush.  DISCHARGE DIAGNOSES: 1. Oral candidiasis. 2. Dysphagia secondary to oral candidiasis. 3. Diabetes. 4. Rheumatoid arthritis on immunosuppressive therapy. 5. History of hypertension. 6. History of non-Hodgkin's lymphoma. 7. History of coronary artery disease status post remote stent. 8. Cerebrovascular accident on chronic Plavix.  HOSPITAL COURSE:  [name] is an 77 year old female who was referred to [place] by the Emergency Department and seen in our office by Mike Gip, PA on October 19, 2010, for dysphagia and odynophagia.  Her symptoms had been present for about a week and a half and associated with swelling and discomfort in the left side of her tongue and face. The patient complained of inability to tolerate solids or liquids secondary to the pain  and everything getting hung up in her throat.  She was admitted to a medical bed at [facility] for IV hydration, baseline laboratories, empiric therapy with antifungal and EGD for further evaluation.  The patient's home medications were continued.  She was given Lovenox for DVT prophylaxis.  Upper endoscopy by Dr. Christella Hartigan was remarkable only for oral thrush.  By October 21, 2010, [name] was feeling better and tolerating a diet.  By that time she had received three doses of Diflucan.  The patient was stable for discharge with plans to continue her antifungal agent for several more days at home.  She will call our office with a condition update in a couple of weeks, or sooner for any further problems or questions.     Willette Cluster, NP   ______________________________ Rachael Fee, MD    PG/MEDQ  D:  10/21/2010  T:  10/21/2010  Job:  [id number]  Electronically Signed by Willette Cluster NP on 11/07/2010 11:35:55 AM Electronically Signed by [name] MD on 12/23/2010 07:57:41 AM

## 2011-01-06 ENCOUNTER — Other Ambulatory Visit: Payer: Self-pay | Admitting: Internal Medicine

## 2011-01-06 NOTE — Group Therapy Note (Signed)
  [name], GARCIAPEREZ          ACCOUNT NO.:  [id number]  MEDICAL RECORD NO.:  [id number]           PATIENT TYPE:  LOCATION:                                 FACILITY:  PHYSICIAN:  Thaine Garriga A. [name], M.D. DATE OF BIRTH:  [birth year]  DATE OF PROCEDURE: DATE OF DISCHARGE:                                PROGRESS NOTE   HISTORY OF PRESENT ILLNESS:  The patient reports that she is feeling better in general.  She still continues to have significant dysarthria and dysphasia, however, headaches have improved.  She has gone a couple of days without having left hemicranial headaches.  Temperature 98.3, pulse 82, respirations 20, blood pressure 127/74.  She is awake and alert.  She is lucid and coherent.  Again, she has significant dysarthria mostly due to hypophonia.  Tongue is slightly deviated to left with atrophy on the left side modest.  She has full extraocular movements.  Facial muscle strength is symmetric.  Pupils are reactive and symmetric.  Visual fields are intact.  ASSESSMENT/PLAN:  Multiple cranial neuropathies unclear etiology.  There is some suspicion that this could be due to medication use to treat rheumatoid arthritis.  This medication can cause peripheral neuropathy and certainly the cranial nerves or peripheral nerves.  This is certainly not be unexpected.  Other causes include inflammatory, infectious, and malignant causes.  The patient likely can be discharged once this is done and follow up in the office with the results of the CSF analysis.     [name] A. [name], M.D.     KAD/MEDQ  D:  12/02/2010  T:  12/03/2010  Job:  [id number]  Electronically Signed by [name] M.D. on 01/06/2011 05:53:54 PM

## 2011-01-09 ENCOUNTER — Ambulatory Visit (HOSPITAL_COMMUNITY): Payer: Medicare Other | Admitting: Speech Pathology

## 2011-01-09 ENCOUNTER — Ambulatory Visit (HOSPITAL_COMMUNITY)
Admission: RE | Admit: 2011-01-09 | Discharge: 2011-01-09 | Disposition: A | Discharge status: Home / Self Care | Payer: Medicare Other | Source: Ambulatory Visit | Attending: Internal Medicine | Admitting: Internal Medicine

## 2011-01-09 ENCOUNTER — Other Ambulatory Visit: Payer: Self-pay | Admitting: Internal Medicine

## 2011-01-09 DIAGNOSIS — R1310 Dysphagia, unspecified: Secondary | ICD-10-CM | POA: Insufficient documentation

## 2011-01-15 ENCOUNTER — Emergency Department (HOSPITAL_COMMUNITY): Payer: Medicare Other

## 2011-01-15 ENCOUNTER — Inpatient Hospital Stay (HOSPITAL_COMMUNITY)
Admission: EM | Admit: 2011-01-15 | Discharge: 2011-01-17 | DRG: 307 | Disposition: A | Discharge status: Home Health | Payer: Medicare Other | Attending: Internal Medicine | Admitting: Internal Medicine

## 2011-01-15 DIAGNOSIS — I359 Nonrheumatic aortic valve disorder, unspecified: Principal | ICD-10-CM | POA: Diagnosis present

## 2011-01-15 DIAGNOSIS — R55 Syncope and collapse: Secondary | ICD-10-CM | POA: Diagnosis present

## 2011-01-15 DIAGNOSIS — I672 Cerebral atherosclerosis: Secondary | ICD-10-CM | POA: Diagnosis present

## 2011-01-15 DIAGNOSIS — M069 Rheumatoid arthritis, unspecified: Secondary | ICD-10-CM | POA: Diagnosis present

## 2011-01-15 DIAGNOSIS — IMO0002 Reserved for concepts with insufficient information to code with codable children: Secondary | ICD-10-CM

## 2011-01-15 DIAGNOSIS — C8190 Hodgkin lymphoma, unspecified, unspecified site: Secondary | ICD-10-CM | POA: Diagnosis present

## 2011-01-15 DIAGNOSIS — E119 Type 2 diabetes mellitus without complications: Secondary | ICD-10-CM | POA: Diagnosis present

## 2011-01-15 LAB — BASIC METABOLIC PANEL
CO2: 30 mEq/L (ref 19–32)
Calcium: 10.1 mg/dL (ref 8.4–10.5)
Creatinine, Ser: 0.86 mg/dL (ref 0.50–1.10)
Glucose, Bld: 192 mg/dL — ABNORMAL HIGH (ref 70–99)

## 2011-01-15 LAB — DIFFERENTIAL
Lymphs Abs: 0.7 10*3/uL (ref 0.7–4.0)
Monocytes Relative: 5 % (ref 3–12)
Neutro Abs: 7.3 10*3/uL (ref 1.7–7.7)
Neutrophils Relative %: 86 % — ABNORMAL HIGH (ref 43–77)

## 2011-01-15 LAB — URINALYSIS, ROUTINE W REFLEX MICROSCOPIC
Glucose, UA: NEGATIVE mg/dL
Hgb urine dipstick: NEGATIVE
Protein, ur: NEGATIVE mg/dL

## 2011-01-15 LAB — CBC
HCT: 32.6 % — ABNORMAL LOW (ref 36.0–46.0)
Hemoglobin: 10.1 g/dL — ABNORMAL LOW (ref 12.0–15.0)
MCH: 26.1 pg (ref 26.0–34.0)
MCV: 84.2 fL (ref 78.0–100.0)
RBC: 3.87 MIL/uL (ref 3.87–5.11)

## 2011-01-15 LAB — TROPONIN I: Troponin I: <0.3 ng/mL (ref <0.30)

## 2011-01-15 LAB — CARDIAC PANEL(CRET KIN+CKTOT+MB+TROPI)
Relative Index: INVALID (ref 0.0–2.5)
Total CK: 50 U/L (ref 7–177)
Troponin I: <0.3 ng/mL (ref <0.30)

## 2011-01-15 LAB — CK TOTAL AND CKMB (NOT AT ARMC): Total CK: 53 U/L (ref 7–177)

## 2011-01-16 LAB — COMPREHENSIVE METABOLIC PANEL
AST: 12 U/L (ref 0–37)
Albumin: 3 g/dL — ABNORMAL LOW (ref 3.5–5.2)
Calcium: 9.8 mg/dL (ref 8.4–10.5)
Chloride: 104 mEq/L (ref 96–112)
Creatinine, Ser: 0.77 mg/dL (ref 0.50–1.10)
Total Protein: 6.4 g/dL (ref 6.0–8.3)

## 2011-01-16 LAB — CBC
MCHC: 31.5 g/dL (ref 30.0–36.0)
MCV: 83.5 fL (ref 78.0–100.0)
Platelets: 192 10*3/uL (ref 150–400)
RDW: 16 % — ABNORMAL HIGH (ref 11.5–15.5)
WBC: 6.4 10*3/uL (ref 4.0–10.5)

## 2011-01-16 LAB — CARDIAC PANEL(CRET KIN+CKTOT+MB+TROPI)
CK, MB: 2 ng/mL (ref 0.3–4.0)
Relative Index: INVALID (ref 0.0–2.5)
Total CK: 49 U/L (ref 7–177)
Troponin I: <0.3 ng/mL (ref <0.30)

## 2011-01-16 LAB — DIFFERENTIAL
Basophils Absolute: 0 K/uL (ref 0.0–0.1)
Basophils Relative: 0 % (ref 0–1)
Eosinophils Absolute: 0.1 K/uL (ref 0.0–0.7)
Eosinophils Relative: 2 % (ref 0–5)
Lymphocytes Relative: 22 % (ref 12–46)
Lymphs Abs: 1.4 K/uL (ref 0.7–4.0)
Monocytes Absolute: 0.5 K/uL (ref 0.1–1.0)
Monocytes Relative: 8 % (ref 3–12)
Neutro Abs: 4.4 K/uL (ref 1.7–7.7)
Neutrophils Relative %: 68 % (ref 43–77)

## 2011-01-16 LAB — GLUCOSE, CAPILLARY
Glucose-Capillary: 115 mg/dL — ABNORMAL HIGH (ref 70–99)
Glucose-Capillary: 187 mg/dL — ABNORMAL HIGH (ref 70–99)
Glucose-Capillary: 149 mg/dL — ABNORMAL HIGH (ref 70–99)

## 2011-01-16 LAB — TSH: TSH: 1.277 u[IU]/mL (ref 0.350–4.500)
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
## 2011-01-17 LAB — URINE CULTURE: Culture  Setup Time: 201206172108

## 2011-01-17 NOTE — Group Therapy Note (Signed)
  NAMEMIRAI, GREENWOOD NO.:  [id number]  MEDICAL RECORD NO.:  [id number]  LOCATION:  A330                          FACILITY:  [facility]  PHYSICIAN:  Wilson Singer, M.D.DATE OF BIRTH:  08/18/1927  DATE OF PROCEDURE: DATE OF DISCHARGE:                                PROGRESS NOTE   This lady feels almost back to her normal self and she has had no further syncopal episodes.  She denies any chest pain, palpitations, and she has no limb weakness at all.  PHYSICAL EXAMINATION:  Temperature 98.4, blood pressure 150/74, pulse 76, saturation 96% on room air.  She looks systemically well.  Heart sounds are present with a systolic murmur as heard before. Lung fields are entirely clear.  There are no focal neurologic signs.  INVESTIGATIONS:  Cardiac enzymes so far have been negative.  Sodium 143, potassium 3.5, bicarbonate 31, BUN 13, creatinine 0.77, hemoglobin stable at 9.9, white blood cell count 6.4, platelets 192,000.  IMPRESSION: 1. Syncope of unclear etiology. 2. History of aortic stenosis, question whether there is now critical     aortic stenosis. 3. Cranial nerve neuropathy is of unclear etiology. 4. Type 2 diabetes controlled. 5. Rheumatoid arthritis steroid-dependent. 6. Cerebrovascular disease with no indication of any new stroke. 7. Remote history of Hodgkin's lymphoma in remission.  PLAN: 1. Await echocardiogram. 2. Possible discharge tomorrow depending on the patient's progress.     Wilson Singer, M.D.     NCG/MEDQ  D:  01/16/2011  T:  01/16/2011  Job:  [id number]  Electronically Signed by Lilly Cove M.D. on 01/17/2011 09:57:41 AM

## 2011-01-17 NOTE — Discharge Summary (Signed)
  [name], NUZZO          ACCOUNT NO.:  [id number]  MEDICAL RECORD NO.:  [id number]  LOCATION:  A330                          FACILITY:  [facility]  PHYSICIAN:  Wilson Singer, M.D.DATE OF BIRTH:  [birth year]  DATE OF ADMISSION:  01/15/2011 DATE OF DISCHARGE:  06/19/2012LH                              DISCHARGE SUMMARY   FINAL DISCHARGE DIAGNOSES: 1. Syncope of unclear etiology. 2. Aortic stenosis.  Will require repeat echocardiogram. 3. Rheumatoid arthritis, steroid-dependent. 4. Cerebrovascular disease. 5. Remote history of Hodgkin's lymphoma.  CONDITION ON DISCHARGE:  Stable.  MEDICATIONS ON DISCHARGE:  We will continue all home medications, which include aspirin 81 mg daily, carbamazepine 100 mg b.i.d., Questran 4 g daily, Janumet 50/1000 one tablet daily, multivitamins 1 tablet daily, Plavix 75 mg daily, pravastatin 40 mg daily, prednisone 5 mg daily, MiraLax 17 g daily, Robitussin-DM as required every 4 hours one teaspoon, Tribenzor 40/10/12.5 one tablet daily.  HISTORY:  This very pleasant 77 year old lady was admitted with syncopal episode.  Please see initial history and physical examination done by Dr. Lilly Cove.  HOSPITAL PROGRESS:  The patient was admitted and serial cardiac enzymes were done.  These were all negative for any ischemia or cardiac infarction.  She also had urine culture done, which only grew 20,000 colonies of multiple bacterial morphocytes and in any case, she never had any real symptoms of UTI.  She did have a CT brain scan done, which did not indicate evidence of any acute event such as stroke, bleed. There was stable and mild small vessel disease.  Chest x-ray was also done, which showed no evidence of acute pulmonary cardiopulmonary disease.  The patient was scheduled for an echocardiogram, but as of yet she has not had it done and I am not sure why.  She will clearly need this as she does have a history of aortic stenosis and her  last echocardiogram 1 year ago in April 2011 showed that she had mild aortic stenosis.  I wonder if this progressed to become critical now causing her symptoms.  PHYSICAL EXAMINATION:  Today, she looks well.  Temperature 98.1, blood pressure 151/71, pulse 74, saturation 96% on room air.  Heart sounds are present with a systolic murmur heard as before.  Lung fields are clear. She is alert and oriented without any focal or neurologic signs.  INVESTIGATIONS:  Other than mentioned above, TSH in the normal range 1.28, hemoglobin A1c elevated at 7.7%.  DISPOSITION:  The patient is stable to be discharged.  I believe and we will get home health for evaluation of safety and just an overall evaluation.  She will need outpatient echocardiogram as unfortunately we will not able to do one here and to see if there is critical aortic stenosis and be referred appropriately to cardiologist and even cardiothoracic surgery if this is felt appropriate.     Wilson Singer, M.D.     NCG/MEDQ  D:  01/17/2011  T:  01/17/2011  Job:  [id number]  Electronically Signed by Lilly Cove M.D. on 01/17/2011 09:57:50 AM

## 2011-01-29 ENCOUNTER — Emergency Department (HOSPITAL_COMMUNITY)
Admission: EM | Admit: 2011-01-29 | Discharge: 2011-01-30 | Disposition: A | Discharge status: Home / Self Care | Payer: Medicare Other | Attending: Emergency Medicine | Admitting: Emergency Medicine

## 2011-01-29 DIAGNOSIS — E785 Hyperlipidemia, unspecified: Secondary | ICD-10-CM | POA: Insufficient documentation

## 2011-01-29 DIAGNOSIS — R490 Dysphonia: Secondary | ICD-10-CM | POA: Insufficient documentation

## 2011-01-29 DIAGNOSIS — E1169 Type 2 diabetes mellitus with other specified complication: Secondary | ICD-10-CM | POA: Insufficient documentation

## 2011-01-29 DIAGNOSIS — R0609 Other forms of dyspnea: Secondary | ICD-10-CM | POA: Insufficient documentation

## 2011-01-29 DIAGNOSIS — R5381 Other malaise: Secondary | ICD-10-CM | POA: Insufficient documentation

## 2011-01-29 DIAGNOSIS — I4949 Other premature depolarization: Secondary | ICD-10-CM | POA: Insufficient documentation

## 2011-01-29 DIAGNOSIS — D649 Anemia, unspecified: Secondary | ICD-10-CM | POA: Insufficient documentation

## 2011-01-29 DIAGNOSIS — J029 Acute pharyngitis, unspecified: Secondary | ICD-10-CM | POA: Insufficient documentation

## 2011-01-29 DIAGNOSIS — E039 Hypothyroidism, unspecified: Secondary | ICD-10-CM | POA: Insufficient documentation

## 2011-01-29 DIAGNOSIS — R059 Cough, unspecified: Secondary | ICD-10-CM | POA: Insufficient documentation

## 2011-01-29 DIAGNOSIS — I2510 Atherosclerotic heart disease of native coronary artery without angina pectoris: Secondary | ICD-10-CM | POA: Insufficient documentation

## 2011-01-29 DIAGNOSIS — Z8679 Personal history of other diseases of the circulatory system: Secondary | ICD-10-CM | POA: Insufficient documentation

## 2011-01-29 DIAGNOSIS — R51 Headache: Secondary | ICD-10-CM | POA: Insufficient documentation

## 2011-01-29 DIAGNOSIS — I10 Essential (primary) hypertension: Secondary | ICD-10-CM | POA: Insufficient documentation

## 2011-01-29 DIAGNOSIS — M069 Rheumatoid arthritis, unspecified: Secondary | ICD-10-CM | POA: Insufficient documentation

## 2011-01-29 DIAGNOSIS — R05 Cough: Secondary | ICD-10-CM | POA: Insufficient documentation

## 2011-01-29 DIAGNOSIS — R0989 Other specified symptoms and signs involving the circulatory and respiratory systems: Secondary | ICD-10-CM | POA: Insufficient documentation

## 2011-01-29 DIAGNOSIS — R1310 Dysphagia, unspecified: Secondary | ICD-10-CM | POA: Insufficient documentation

## 2011-01-29 LAB — CBC
MCH: 26.8 pg (ref 26.0–34.0)
MCHC: 32.6 g/dL (ref 30.0–36.0)
MCV: 82.2 fL (ref 78.0–100.0)
Platelets: 223 10*3/uL (ref 150–400)

## 2011-01-30 ENCOUNTER — Emergency Department (HOSPITAL_COMMUNITY): Payer: Medicare Other

## 2011-01-30 LAB — BASIC METABOLIC PANEL
BUN: 12 mg/dL (ref 6–23)
CO2: 31 mEq/L (ref 19–32)
Calcium: 10.1 mg/dL (ref 8.4–10.5)
Chloride: 99 mEq/L (ref 96–112)
Creatinine, Ser: 0.62 mg/dL (ref 0.50–1.10)
GFR calc Af Amer: >60 mL/min (ref >60)

## 2011-01-30 LAB — TROPONIN I: Troponin I: <0.3 ng/mL (ref <0.30)

## 2011-02-18 ENCOUNTER — Other Ambulatory Visit: Payer: Self-pay | Admitting: Internal Medicine

## 2011-02-28 ENCOUNTER — Ambulatory Visit: Payer: Medicare Other | Admitting: Family Medicine

## 2011-05-09 LAB — URINALYSIS, ROUTINE W REFLEX MICROSCOPIC
Bilirubin Urine: NEGATIVE
Glucose, UA: NEGATIVE
Hgb urine dipstick: NEGATIVE
Ketones, ur: NEGATIVE
Ketones, ur: NEGATIVE
Nitrite: NEGATIVE
Nitrite: NEGATIVE
Protein, ur: NEGATIVE
Protein, ur: NEGATIVE
Specific Gravity, Urine: 1.016
Urobilinogen, UA: 0.2
Urobilinogen, UA: 0.2
pH: 5.5

## 2011-05-09 LAB — DIFFERENTIAL
Basophils Absolute: 0
Basophils Absolute: 0
Basophils Relative: 0
Eosinophils Absolute: 0.2
Eosinophils Relative: 5
Eosinophils Relative: 3
Eosinophils Relative: 4
Lymphocytes Relative: 26
Lymphocytes Relative: 31
Lymphs Abs: 1.4
Monocytes Absolute: 0.3
Monocytes Relative: 7
Neutro Abs: 3.2
Neutrophils Relative %: 58

## 2011-05-09 LAB — URINE MICROSCOPIC-ADD ON

## 2011-05-09 LAB — BASIC METABOLIC PANEL
BUN: 20
CO2: 31
Calcium: 9
Calcium: 9.1
Creatinine, Ser: 0.98
GFR calc Af Amer: >60
GFR calc non Af Amer: 57 — ABNORMAL LOW
Glucose, Bld: 163 — ABNORMAL HIGH

## 2011-05-09 LAB — POCT CARDIAC MARKERS
Myoglobin, poc: 160
Operator id: 234501
Operator id: 216471
Troponin i, poc: <0.05

## 2011-05-09 LAB — I-STAT 8, (EC8 V) (CONVERTED LAB)
BUN: 16
Bicarbonate: 31.4 — ABNORMAL HIGH
Chloride: 104
Glucose, Bld: 103 — ABNORMAL HIGH
Hemoglobin: 13.9
Sodium: 138
pCO2, Ven: 53.7 — ABNORMAL HIGH

## 2011-05-09 LAB — COMPREHENSIVE METABOLIC PANEL
ALT: 12
AST: 17
Alkaline Phosphatase: 69
Alkaline Phosphatase: 73
BUN: 18
CO2: 29
Chloride: 106
Chloride: 105
Creatinine, Ser: 1.04
GFR calc Af Amer: >60
GFR calc non Af Amer: >60
GFR calc non Af Amer: 52 — ABNORMAL LOW
Glucose, Bld: 110 — ABNORMAL HIGH
Potassium: 4.3
Potassium: 3.8
Sodium: 140
Total Bilirubin: 0.4
Total Bilirubin: 0.5

## 2011-05-09 LAB — CBC
HCT: 33.9 — ABNORMAL LOW
Hemoglobin: 11.3 — ABNORMAL LOW
Hemoglobin: 11.3 — ABNORMAL LOW
MCHC: 32.4
MCV: 82.3
MCV: 82.5
Platelets: 199
RBC: 3.91
RDW: 14.8
WBC: 5.2
WBC: 4.6

## 2011-05-09 LAB — URINE CULTURE: Colony Count: 10000

## 2011-05-09 LAB — POCT I-STAT CREATININE
Creatinine, Ser: 1.2
Operator id: 234501

## 2011-05-09 LAB — PROTIME-INR: Prothrombin Time: 13.4

## 2011-05-09 LAB — HOMOCYSTEINE: Homocysteine: 11

## 2011-05-09 LAB — VITAMIN B12: Vitamin B-12: 676 (ref 211–911)

## 2011-05-09 LAB — APTT: aPTT: 34

## 2011-05-18 ENCOUNTER — Encounter: Payer: Self-pay | Admitting: Cardiology

## 2011-06-30 ENCOUNTER — Ambulatory Visit (INDEPENDENT_AMBULATORY_CARE_PROVIDER_SITE_OTHER): Payer: Medicare Other | Admitting: Cardiology

## 2011-06-30 ENCOUNTER — Encounter: Payer: Self-pay | Admitting: Cardiology

## 2011-06-30 DIAGNOSIS — I739 Peripheral vascular disease, unspecified: Secondary | ICD-10-CM

## 2011-06-30 DIAGNOSIS — I10 Essential (primary) hypertension: Secondary | ICD-10-CM | POA: Insufficient documentation

## 2011-06-30 DIAGNOSIS — C8190 Hodgkin lymphoma, unspecified, unspecified site: Secondary | ICD-10-CM | POA: Insufficient documentation

## 2011-06-30 DIAGNOSIS — N318 Other neuromuscular dysfunction of bladder: Secondary | ICD-10-CM

## 2011-06-30 DIAGNOSIS — G2581 Restless legs syndrome: Secondary | ICD-10-CM | POA: Insufficient documentation

## 2011-06-30 DIAGNOSIS — I679 Cerebrovascular disease, unspecified: Secondary | ICD-10-CM | POA: Insufficient documentation

## 2011-06-30 DIAGNOSIS — M1990 Unspecified osteoarthritis, unspecified site: Secondary | ICD-10-CM | POA: Insufficient documentation

## 2011-06-30 DIAGNOSIS — E785 Hyperlipidemia, unspecified: Secondary | ICD-10-CM

## 2011-06-30 DIAGNOSIS — I2510 Atherosclerotic heart disease of native coronary artery without angina pectoris: Secondary | ICD-10-CM | POA: Insufficient documentation

## 2011-06-30 DIAGNOSIS — R55 Syncope and collapse: Secondary | ICD-10-CM

## 2011-06-30 DIAGNOSIS — E669 Obesity, unspecified: Secondary | ICD-10-CM

## 2011-06-30 DIAGNOSIS — E119 Type 2 diabetes mellitus without complications: Secondary | ICD-10-CM

## 2011-06-30 DIAGNOSIS — D638 Anemia in other chronic diseases classified elsewhere: Secondary | ICD-10-CM | POA: Insufficient documentation

## 2011-06-30 DIAGNOSIS — D649 Anemia, unspecified: Secondary | ICD-10-CM

## 2011-06-30 NOTE — Assessment & Plan Note (Signed)
Blood pressure is well controlled with current medication, which will be continued.

## 2011-06-30 NOTE — Assessment & Plan Note (Signed)
The patient remains asymptomatic with respect to peripheral vascular disease and requires no further treatment or testing at the present time.

## 2011-06-30 NOTE — Assessment & Plan Note (Signed)
Patient has been told of anemia in the past, but has never been evaluated by a specialist.  She believes that she has been referred to a hematologist at [facility].

## 2011-06-30 NOTE — Assessment & Plan Note (Signed)
Review of hospital records indicates no apparent etiology for patient's loss of consciousness other than transient orthostatic hypotension.  She is cautioned about arising suddenly from a sitting or lying position and about the need to immediately assumed a sitting lying position when dizziness occurs.  Additional medical problems addressed at [facility] are unclear.  Records have been requested.

## 2011-06-30 NOTE — Progress Notes (Signed)
Patient ID: [name], female   DOB: July 19, 1928, 77 y.o.   MRN: [id number] HPI: Return visit for reassessment of coronary disease and mild aortic stenosis.  This nice woman was admitted to hospital a few months ago as result of syncope, which occurred after she arose from a sitting position.  Testing in hospital was unrevealing.  Echocardiography was unavailable and thus was not performed.    Patient has received a substantial amount of care at [facility].  She was recently evaluated there for left sided headache and facial pain, which was apparently thought to be due to a neurological issue, perhaps trigeminal neuralgia.  She underwent some sort of endoscopic procedure there in October of 2012, which resulted in hoarseness.  Prior to Admission medications   Medication Sig Start Date End Date Taking? Authorizing Provider  acetaminophen (TYLENOL) 325 MG tablet Take 650 mg by mouth as needed.     Yes Historical Provider, MD  aspirin 81 MG tablet Take 81 mg by mouth daily.     Yes Historical Provider, MD  carvedilol (COREG) 12.5 MG tablet Take 12.5 mg by mouth 2 (two) times daily with a meal.     Yes Historical Provider, MD  clopidogrel (PLAVIX) 75 MG tablet Take 75 mg by mouth daily.     Yes Historical Provider, MD  docusate sodium (COLACE) 100 MG capsule Take 100 mg by mouth 2 (two) times daily.     Yes Historical Provider, MD  hydrochlorothiazide (HYDRODIURIL) 12.5 MG tablet Take 12.5 mg by mouth daily.     Yes Historical Provider, MD  losartan (COZAAR) 100 MG tablet Take 100 mg by mouth daily.     Yes Historical Provider, MD  Multiple Vitamin (MULTIVITAMIN) tablet Take 1 tablet by mouth daily.     Yes Historical Provider, MD  pravastatin (PRAVACHOL) 40 MG tablet Take 40 mg by mouth daily.     Yes Historical Provider, MD  predniSONE (DELTASONE) 10 MG tablet Take 5 mg by mouth daily.    Yes Historical Provider, MD  sitaGLIPtan-metformin (JANUMET) 50-500 MG per tablet Take 1 tablet by  mouth 2 (two) times daily with a meal.    Yes Historical Provider, MD  No Known Allergies    Past medical history, social history, and family history reviewed and updated.  ROS: Denies chest pain, orthopnea, dyspnea on exertion, PND, palpitations, lightheadedness or syncope.  PHYSICAL EXAM: BP 135/71  Pulse 75  Ht 4\' 9"  (1.448 m)  Wt 80.74 kg (178 lb)  BMI 38.52 kg/m2  SpO2 97%  General-Well developed; no acute distress; poor dentition; hoarse voice Body habitus-proportionate weight and height Neck-No JVD; no carotid bruits; decreased carotid upstroke Lungs-clear lung fields; resonant to percussion Cardiovascular-normal PMI; normal S1 and S2; grade 2/6 early peaking systolic ejection murmur at the cardiac base radiating to the carotids Abdomen-normal bowel sounds; soft and non-tender without masses or organomegaly Musculoskeletal-No deformities, no cyanosis or clubbing Neurologic-Normal cranial nerves; symmetric strength and tone Skin-Warm, no significant lesions Extremities-distal pulses intact one left; right above-knee amputation; no edema  ASSESSMENT AND PLAN:  Montgomery Bing, MD 06/30/2011 8:21 PM

## 2011-06-30 NOTE — Assessment & Plan Note (Signed)
Excellent control of hyperlipidemia when last assessed more than one year ago.  Repeat laboratory studies will be obtained.

## 2011-06-30 NOTE — Assessment & Plan Note (Signed)
Coronary disease has been quiescent for more than a decade.  Control of cardiovascular risk factors will be optimized.

## 2011-06-30 NOTE — Patient Instructions (Signed)
**Note De-identified  Obfuscation** Your physician recommends that you schedule a follow-up appointment in: 4 months.  Your physician recommends that you continue on your current medications as directed. Please refer to the Current Medication list given to you today.  

## 2011-08-10 DIAGNOSIS — E119 Type 2 diabetes mellitus without complications: Secondary | ICD-10-CM | POA: Diagnosis not present

## 2011-08-10 DIAGNOSIS — L609 Nail disorder, unspecified: Secondary | ICD-10-CM | POA: Diagnosis not present

## 2011-08-10 DIAGNOSIS — L851 Acquired keratosis [keratoderma] palmaris et plantaris: Secondary | ICD-10-CM | POA: Diagnosis not present

## 2011-08-10 DIAGNOSIS — E1149 Type 2 diabetes mellitus with other diabetic neurological complication: Secondary | ICD-10-CM | POA: Diagnosis not present

## 2011-08-14 DIAGNOSIS — R1312 Dysphagia, oropharyngeal phase: Secondary | ICD-10-CM | POA: Diagnosis not present

## 2011-08-14 DIAGNOSIS — J3800 Paralysis of vocal cords and larynx, unspecified: Secondary | ICD-10-CM | POA: Diagnosis not present

## 2011-08-29 DIAGNOSIS — E119 Type 2 diabetes mellitus without complications: Secondary | ICD-10-CM | POA: Diagnosis not present

## 2011-08-29 DIAGNOSIS — M069 Rheumatoid arthritis, unspecified: Secondary | ICD-10-CM | POA: Diagnosis not present

## 2011-08-29 DIAGNOSIS — Z79899 Other long term (current) drug therapy: Secondary | ICD-10-CM | POA: Diagnosis not present

## 2011-08-29 DIAGNOSIS — G839 Paralytic syndrome, unspecified: Secondary | ICD-10-CM | POA: Diagnosis not present

## 2011-08-29 DIAGNOSIS — J3801 Paralysis of vocal cords and larynx, unilateral: Secondary | ICD-10-CM | POA: Diagnosis not present

## 2011-08-29 DIAGNOSIS — E785 Hyperlipidemia, unspecified: Secondary | ICD-10-CM | POA: Diagnosis not present

## 2011-08-29 DIAGNOSIS — Z7982 Long term (current) use of aspirin: Secondary | ICD-10-CM | POA: Diagnosis not present

## 2011-08-29 DIAGNOSIS — J3800 Paralysis of vocal cords and larynx, unspecified: Secondary | ICD-10-CM | POA: Diagnosis not present

## 2011-08-29 DIAGNOSIS — I10 Essential (primary) hypertension: Secondary | ICD-10-CM | POA: Diagnosis not present

## 2011-09-13 DIAGNOSIS — M810 Age-related osteoporosis without current pathological fracture: Secondary | ICD-10-CM | POA: Diagnosis not present

## 2011-09-13 DIAGNOSIS — M069 Rheumatoid arthritis, unspecified: Secondary | ICD-10-CM | POA: Diagnosis not present

## 2011-09-25 DIAGNOSIS — Z1231 Encounter for screening mammogram for malignant neoplasm of breast: Secondary | ICD-10-CM | POA: Diagnosis not present

## 2011-09-28 DIAGNOSIS — Z09 Encounter for follow-up examination after completed treatment for conditions other than malignant neoplasm: Secondary | ICD-10-CM | POA: Diagnosis not present

## 2011-09-28 DIAGNOSIS — J3800 Paralysis of vocal cords and larynx, unspecified: Secondary | ICD-10-CM | POA: Diagnosis not present

## 2011-10-12 DIAGNOSIS — R1312 Dysphagia, oropharyngeal phase: Secondary | ICD-10-CM | POA: Diagnosis not present

## 2011-10-19 DIAGNOSIS — E119 Type 2 diabetes mellitus without complications: Secondary | ICD-10-CM | POA: Diagnosis not present

## 2011-10-19 DIAGNOSIS — L609 Nail disorder, unspecified: Secondary | ICD-10-CM | POA: Diagnosis not present

## 2011-10-19 DIAGNOSIS — E1149 Type 2 diabetes mellitus with other diabetic neurological complication: Secondary | ICD-10-CM | POA: Diagnosis not present

## 2011-10-19 DIAGNOSIS — L851 Acquired keratosis [keratoderma] palmaris et plantaris: Secondary | ICD-10-CM | POA: Diagnosis not present

## 2011-10-26 ENCOUNTER — Encounter: Payer: Self-pay | Admitting: Cardiology

## 2011-10-26 ENCOUNTER — Ambulatory Visit (INDEPENDENT_AMBULATORY_CARE_PROVIDER_SITE_OTHER): Payer: Medicare Other | Admitting: Cardiology

## 2011-10-26 VITALS — BP 175/71 | HR 67 | Resp 16 | Ht 59.0 in | Wt 182.0 lb

## 2011-10-26 DIAGNOSIS — R55 Syncope and collapse: Secondary | ICD-10-CM

## 2011-10-26 DIAGNOSIS — I10 Essential (primary) hypertension: Secondary | ICD-10-CM | POA: Diagnosis not present

## 2011-10-26 DIAGNOSIS — I2510 Atherosclerotic heart disease of native coronary artery without angina pectoris: Secondary | ICD-10-CM

## 2011-10-26 DIAGNOSIS — D649 Anemia, unspecified: Secondary | ICD-10-CM

## 2011-10-26 DIAGNOSIS — I739 Peripheral vascular disease, unspecified: Secondary | ICD-10-CM

## 2011-10-26 MED ORDER — HYDROCHLOROTHIAZIDE 25 MG PO TABS: 25.0000 mg | ORAL_TABLET | Freq: Every day | ORAL | Status: DC

## 2011-10-26 MED ORDER — CARVEDILOL 25 MG PO TABS: 25.0000 mg | ORAL_TABLET | Freq: Two times a day (BID) | ORAL | Status: DC

## 2011-10-26 NOTE — Progress Notes (Signed)
Patient ID: [name], female   DOB: 1928/04/09, 78 y.o.   MRN: [id number]  HPI: Scheduled return visit for this very nice woman with a remote history of coronary disease, multiple cardiovascular risk factors and a history of syncopal spells every few years.  Since hospitalization for apparent loss of consciousness 4 months ago, she has done quite well.  She required a surgical procedure performed by an ENT surgeon for what sounds like hoarseness.  This surgery was performed at [facility], and records have been requested.  She is doing well from a cardiovascular standpoint with no chest discomfort, no lightheadedness, no palpitations and no syncope.  Blood pressure is only occasionally monitored at home, but no markedly elevated values are recalled by the patient or her husband.  Prior to Admission medications   Medication Sig Start Date End Date Taking? Authorizing Provider  acetaminophen (TYLENOL) 325 MG tablet Take 650 mg by mouth as needed.     Yes Historical Provider, MD  amLODipine (NORVASC) 10 MG tablet Take 10 mg by mouth daily.   Yes Historical Provider, MD  aspirin 81 MG tablet Take 81 mg by mouth daily.     Yes Historical Provider, MD  carvedilol (COREG) 12.5 MG tablet Take 12.5 mg by mouth 2 (two) times daily with a meal.     Yes Historical Provider, MD  clopidogrel (PLAVIX) 75 MG tablet Take 75 mg by mouth daily.     Yes Historical Provider, MD  docusate sodium (COLACE) 100 MG capsule Take 100 mg by mouth 2 (two) times daily.     Yes Historical Provider, MD  hydrochlorothiazide (HYDRODIURIL) 12.5 MG tablet Take 12.5 mg by mouth daily.     Yes Historical Provider, MD  losartan (COZAAR) 100 MG tablet Take 100 mg by mouth daily.     Yes Historical Provider, MD  MENTHOL-METHYL SALICYLATE EX Apply topically 3 (three) times daily as needed.   Yes Historical Provider, MD  Multiple Vitamin (MULTIVITAMIN) tablet Take 1 tablet by mouth daily.     Yes Historical Provider, MD  pravastatin  (PRAVACHOL) 40 MG tablet Take 40 mg by mouth daily.     Yes Historical Provider, MD  predniSONE (DELTASONE) 10 MG tablet Take 5 mg by mouth daily.    Yes Historical Provider, MD  sitaGLIPtan-metformin (JANUMET) 50-500 MG per tablet Take 1 tablet by mouth daily.    Yes Historical Provider, MD  No Known Allergies    Past medical history, social history, and family history reviewed and updated.  ROS: Denies orthopnea, PND, pedal edema, cough or sputum production.  All other systems reviewed and are negative.  PHYSICAL EXAM: BP 175/71  Pulse 67  Resp 16  Ht 4\' 11"  (1.499 m)  Wt 82.555 kg (182 lb)  BMI 36.76 kg/m2; repeat blood pressure-155/65 General-Well developed; no acute distress Body habitus-Moderately overweight Neck-No JVD; no carotid bruits Lungs-clear lung fields; resonant to percussion Cardiovascular-normal PMI; normal S1 and S2; grade 2/6 basilar systolic ejection murmur, early to mid-peaking Abdomen-normal bowel sounds; soft and non-tender without masses or organomegaly Musculoskeletal-No deformities, no cyanosis or clubbing Neurologic-Normal cranial nerves; symmetric strength and tone Skin-Warm, no significant lesions Extremities-distal pulses intact; no edema; status post below-knee amputation on the right.  ASSESSMENT AND PLAN:  Stacey Street Bing, MD 10/26/2011 1:26 PM

## 2011-10-26 NOTE — Assessment & Plan Note (Addendum)
Continues asymptomatic with respect to coronary disease.  We will persist in our attempt to optimally control risk factors.  Aortic stenosis remains mild by physical examination.

## 2011-10-26 NOTE — Patient Instructions (Addendum)
Your physician recommends that you schedule a follow-up appointment in: 6 month follow up  Your physician has recommended you make the following change in your medication:  1 - INCREASE HCTZ to 25 mg daily 2 - INCREASE Coreg to 25 mg twice a day  Your physician has requested that you regularly monitor and record your blood pressure readings at home. Please use the same machine at the same time of day to check your readings and record them to bring to your follow-up visit.  Your physician recommends that you return for lab work in: TODAY  Stool cards x 3 and return to office as soon as possible

## 2011-10-26 NOTE — Assessment & Plan Note (Addendum)
Patient has still not apparently been evaluated for anemia.  She reports a negative screening colonoscopy 4 years ago.  Iron studies are suggestive of iron deficiency, but ferritin was normal.  Stool for Hemoccult testing will be obtained.  Dr. Chestine Spore may wish to refer to Hematology or GI for further evaluation.

## 2011-10-26 NOTE — Assessment & Plan Note (Signed)
Patient reports difficulty maintaining balance, but does well with a walker.  Previous "syncopal spells" may have represented falls.  Additional testing will be deferred until a repeat episode occurs.

## 2011-10-26 NOTE — Assessment & Plan Note (Addendum)
Blood pressure control is suboptimal.  Coreg will be increased to 25 mg b.i.d and hydrochlorothiazide to 25 mg per day.  Patient and family will monitor blood pressure at home and return for blood pressure assessment by the cardiology nurses in one month.

## 2011-10-27 LAB — COMPREHENSIVE METABOLIC PANEL
BUN: 19 mg/dL (ref 6–23)
CO2: 27 mEq/L (ref 19–32)
Calcium: 9.6 mg/dL (ref 8.4–10.5)
Chloride: 102 mEq/L (ref 96–112)
Creat: 0.77 mg/dL (ref 0.50–1.10)

## 2011-10-27 LAB — CBC
HCT: 35.7 % — ABNORMAL LOW (ref 36.0–46.0)
MCV: 85.8 fL (ref 78.0–100.0)
RBC: 4.16 MIL/uL (ref 3.87–5.11)
WBC: 7 10*3/uL (ref 4.0–10.5)

## 2011-10-30 ENCOUNTER — Encounter: Payer: Self-pay | Admitting: Cardiology

## 2011-10-30 ENCOUNTER — Encounter: Payer: Self-pay | Admitting: *Deleted

## 2011-10-31 DIAGNOSIS — R1319 Other dysphagia: Secondary | ICD-10-CM | POA: Diagnosis not present

## 2011-10-31 DIAGNOSIS — K224 Dyskinesia of esophagus: Secondary | ICD-10-CM | POA: Diagnosis not present

## 2011-10-31 DIAGNOSIS — K449 Diaphragmatic hernia without obstruction or gangrene: Secondary | ICD-10-CM | POA: Diagnosis not present

## 2011-10-31 DIAGNOSIS — R1310 Dysphagia, unspecified: Secondary | ICD-10-CM | POA: Diagnosis not present

## 2011-11-03 ENCOUNTER — Encounter: Payer: Self-pay | Admitting: *Deleted

## 2011-11-27 ENCOUNTER — Encounter: Payer: Self-pay | Admitting: Cardiology

## 2011-11-27 ENCOUNTER — Ambulatory Visit (INDEPENDENT_AMBULATORY_CARE_PROVIDER_SITE_OTHER): Payer: Medicare Other

## 2011-11-27 VITALS — BP 103/62 | HR 58 | Ht 60.0 in | Wt 185.0 lb

## 2011-11-27 DIAGNOSIS — I10 Essential (primary) hypertension: Secondary | ICD-10-CM

## 2011-11-27 NOTE — Progress Notes (Signed)
**Note De-Identified  Obfuscation** S: [name]. Arrives in office for a BP check with nurse. B: At last OV with Dr. Dietrich Pates on 3-28 [name]. was advised to increase HCTZ to 25 mg daily and Coreg to 25 mg twice daily due to sub optimal BP. A: [name]. has no complaint at this time. Her BP this morning is 103/62 and at last OV her BP was 175/71. She did bring in her BP diary, the highest reading was on 3-31 at 150/60 and the lowest reading was on 4-17 at 101/55. All other readings are slightly higher/lower than today's BP but, for the most part, are near normal.  R: [name]. Advised to continue current medical treatment and that we will contact her with Dr. Marvel Plan recommendations./LV

## 2011-11-30 NOTE — Progress Notes (Signed)
Patient ID: [name], female   DOB: 1928/07/24, 78 y.o.   MRN: [id number]  Excellent response to adjustment of antihypertensive medication. Continue current therapy. BMet in 2 weeks.

## 2011-12-01 ENCOUNTER — Other Ambulatory Visit: Payer: Self-pay | Admitting: *Deleted

## 2011-12-01 ENCOUNTER — Encounter: Payer: Self-pay | Admitting: *Deleted

## 2011-12-01 DIAGNOSIS — I10 Essential (primary) hypertension: Secondary | ICD-10-CM

## 2011-12-01 NOTE — Progress Notes (Signed)
BMET ordered and letter sent

## 2011-12-05 DIAGNOSIS — Z961 Presence of intraocular lens: Secondary | ICD-10-CM | POA: Diagnosis not present

## 2011-12-05 DIAGNOSIS — E119 Type 2 diabetes mellitus without complications: Secondary | ICD-10-CM | POA: Diagnosis not present

## 2011-12-07 ENCOUNTER — Other Ambulatory Visit: Payer: Self-pay | Admitting: Cardiology

## 2011-12-07 DIAGNOSIS — D649 Anemia, unspecified: Secondary | ICD-10-CM | POA: Diagnosis not present

## 2011-12-08 ENCOUNTER — Encounter: Payer: Self-pay | Admitting: Cardiology

## 2011-12-08 LAB — CBC
HCT: 34.7 % — ABNORMAL LOW (ref 36.0–46.0)
Hemoglobin: 10.7 g/dL — ABNORMAL LOW (ref 12.0–15.0)
RBC: 4.02 MIL/uL (ref 3.87–5.11)
RDW: 14.8 % (ref 11.5–15.5)
WBC: 7 10*3/uL (ref 4.0–10.5)

## 2011-12-08 LAB — COMPREHENSIVE METABOLIC PANEL
AST: 17 U/L (ref 0–37)
Albumin: 3.8 g/dL (ref 3.5–5.2)
BUN: 22 mg/dL (ref 6–23)
CO2: 27 mEq/L (ref 19–32)
Calcium: 9.7 mg/dL (ref 8.4–10.5)
Chloride: 102 mEq/L (ref 96–112)
Glucose, Bld: 153 mg/dL — ABNORMAL HIGH (ref 70–99)
Potassium: 4.6 mEq/L (ref 3.5–5.3)

## 2011-12-09 ENCOUNTER — Encounter: Payer: Self-pay | Admitting: Cardiology

## 2011-12-13 DIAGNOSIS — E1169 Type 2 diabetes mellitus with other specified complication: Secondary | ICD-10-CM | POA: Diagnosis not present

## 2011-12-13 DIAGNOSIS — E119 Type 2 diabetes mellitus without complications: Secondary | ICD-10-CM | POA: Diagnosis not present

## 2011-12-13 DIAGNOSIS — Z9861 Coronary angioplasty status: Secondary | ICD-10-CM | POA: Diagnosis not present

## 2011-12-13 DIAGNOSIS — R51 Headache: Secondary | ICD-10-CM | POA: Diagnosis not present

## 2011-12-13 DIAGNOSIS — I6350 Cerebral infarction due to unspecified occlusion or stenosis of unspecified cerebral artery: Secondary | ICD-10-CM | POA: Diagnosis not present

## 2011-12-13 DIAGNOSIS — I10 Essential (primary) hypertension: Secondary | ICD-10-CM | POA: Diagnosis not present

## 2011-12-13 DIAGNOSIS — E785 Hyperlipidemia, unspecified: Secondary | ICD-10-CM | POA: Diagnosis not present

## 2011-12-13 DIAGNOSIS — K5909 Other constipation: Secondary | ICD-10-CM | POA: Diagnosis not present

## 2011-12-14 ENCOUNTER — Other Ambulatory Visit: Payer: Self-pay | Admitting: *Deleted

## 2011-12-14 DIAGNOSIS — I10 Essential (primary) hypertension: Secondary | ICD-10-CM

## 2011-12-27 ENCOUNTER — Encounter: Payer: Self-pay | Admitting: *Deleted

## 2012-01-11 DIAGNOSIS — M069 Rheumatoid arthritis, unspecified: Secondary | ICD-10-CM | POA: Diagnosis not present

## 2012-01-17 ENCOUNTER — Other Ambulatory Visit: Payer: Self-pay | Admitting: *Deleted

## 2012-01-17 MED ORDER — CARVEDILOL 25 MG PO TABS: 25.0000 mg | ORAL_TABLET | Freq: Two times a day (BID) | ORAL | Status: DC

## 2012-01-17 MED ORDER — HYDROCHLOROTHIAZIDE 25 MG PO TABS: 25.0000 mg | ORAL_TABLET | Freq: Every day | ORAL | Status: DC

## 2012-01-18 DIAGNOSIS — L851 Acquired keratosis [keratoderma] palmaris et plantaris: Secondary | ICD-10-CM | POA: Diagnosis not present

## 2012-01-18 DIAGNOSIS — E1149 Type 2 diabetes mellitus with other diabetic neurological complication: Secondary | ICD-10-CM | POA: Diagnosis not present

## 2012-01-18 LAB — HM DIABETES FOOT EXAM

## 2012-01-31 ENCOUNTER — Other Ambulatory Visit: Payer: Self-pay | Admitting: Cardiology

## 2012-01-31 DIAGNOSIS — I10 Essential (primary) hypertension: Secondary | ICD-10-CM | POA: Diagnosis not present

## 2012-02-01 LAB — BASIC METABOLIC PANEL
Chloride: 101 mEq/L (ref 96–112)
Creat: 0.94 mg/dL (ref 0.50–1.10)

## 2012-02-02 ENCOUNTER — Encounter: Payer: Self-pay | Admitting: Cardiology

## 2012-02-12 ENCOUNTER — Encounter: Payer: Self-pay | Admitting: *Deleted

## 2012-03-07 DIAGNOSIS — Z9861 Coronary angioplasty status: Secondary | ICD-10-CM | POA: Diagnosis not present

## 2012-03-07 DIAGNOSIS — R5381 Other malaise: Secondary | ICD-10-CM | POA: Diagnosis not present

## 2012-03-07 DIAGNOSIS — I10 Essential (primary) hypertension: Secondary | ICD-10-CM | POA: Diagnosis not present

## 2012-03-07 DIAGNOSIS — E1169 Type 2 diabetes mellitus with other specified complication: Secondary | ICD-10-CM | POA: Diagnosis not present

## 2012-03-07 DIAGNOSIS — M069 Rheumatoid arthritis, unspecified: Secondary | ICD-10-CM | POA: Diagnosis not present

## 2012-03-28 ENCOUNTER — Encounter: Payer: Self-pay | Admitting: Adult Health

## 2012-03-28 ENCOUNTER — Ambulatory Visit (INDEPENDENT_AMBULATORY_CARE_PROVIDER_SITE_OTHER): Payer: Medicare Other | Admitting: Adult Health

## 2012-03-28 VITALS — BP 106/52 | HR 70 | Ht <= 58 in | Wt 182.0 lb

## 2012-03-28 DIAGNOSIS — I10 Essential (primary) hypertension: Secondary | ICD-10-CM

## 2012-03-28 DIAGNOSIS — I679 Cerebrovascular disease, unspecified: Secondary | ICD-10-CM | POA: Diagnosis not present

## 2012-03-28 DIAGNOSIS — I350 Nonrheumatic aortic (valve) stenosis: Secondary | ICD-10-CM

## 2012-03-28 DIAGNOSIS — I359 Nonrheumatic aortic valve disorder, unspecified: Secondary | ICD-10-CM

## 2012-03-28 MED ORDER — AMLODIPINE BESYLATE 5 MG PO TABS: 5.0000 mg | ORAL_TABLET | Freq: Every day | ORAL | Status: DC

## 2012-03-28 NOTE — Assessment & Plan Note (Signed)
Pressure is soft 98/68, with repeat blood pressure standing with very little change. Unable to do orthostatics and she is unable to get on the table to lie down. Review of her medications shows that she is on 4 different antihypertensives. We will discontinue HCTZ, and decreased Norvasc from 10 mg to 5 mg daily. I will repeat her echocardiogram to evaluate her aortic valve as this can be causing issues with shortness of breath and lightheadedness. Most recent echocardiogram was completed in 2011 which did reveal mildly to moderately calcified annulus moderately thickened calcified leaflets cusps separation was mildly reduced. The valve area at that time was 1.59 cm by VTI 1.67 cm square by Vmax. She will return to the office in a couple weeks for blood pressure check and to discuss the results of the echocardiogram.

## 2012-03-28 NOTE — Patient Instructions (Addendum)
Your physician has requested that you have an echocardiogram next week. Echocardiography is a painless test that uses sound waves to create images of your heart. It provides your doctor with information about the size and shape of your heart and how well your heart's chambers and valves are working. This procedure takes approximately one hour. There are no restrictions for this procedure.  Your physician recommends that you schedule a follow-up appointment in: 2 weeks with Joni Reining, NP.  STOP HCTZ!!  Decrease Amlodipine to 5mg  daily.
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
## 2012-03-28 NOTE — Assessment & Plan Note (Signed)
Most recent carotid ultrasound was completed on 06/14/2007 which revealed port sugars carotid systems with minimal plaque formation. Elevated peak systolic velocity in ICA/CCA ratio seen at the right ICA the visualized plaque only. Mild in degree. The velocities obtained corresponds with 50-69% diameter stenosis of the right ICA, although this may be overestimated due to tortuosity of the carotid vessels. We will not repeat the Doppler study at this time. Doubt this is etiology of her dizziness. If it continues to be an issue with changes in antihypertensives, will pursue further evaluation with a repeat Doppler study.

## 2012-04-05 ENCOUNTER — Ambulatory Visit (HOSPITAL_COMMUNITY)
Admission: RE | Admit: 2012-04-05 | Discharge: 2012-04-05 | Disposition: A | Discharge status: Home / Self Care | Payer: Medicare Other | Source: Ambulatory Visit | Attending: Adult Health | Admitting: Adult Health

## 2012-04-05 DIAGNOSIS — I10 Essential (primary) hypertension: Secondary | ICD-10-CM | POA: Diagnosis not present

## 2012-04-05 DIAGNOSIS — I359 Nonrheumatic aortic valve disorder, unspecified: Secondary | ICD-10-CM

## 2012-04-05 DIAGNOSIS — E119 Type 2 diabetes mellitus without complications: Secondary | ICD-10-CM | POA: Insufficient documentation

## 2012-04-05 DIAGNOSIS — I350 Nonrheumatic aortic (valve) stenosis: Secondary | ICD-10-CM

## 2012-04-05 NOTE — Progress Notes (Signed)
  Echocardiogram 2D Echocardiogram has been performed.  Nestor Ramp M RCS 04/05/2012, 3:03 PM

## 2012-04-08 ENCOUNTER — Ambulatory Visit (INDEPENDENT_AMBULATORY_CARE_PROVIDER_SITE_OTHER): Payer: Medicare Other | Admitting: Adult Health

## 2012-04-08 ENCOUNTER — Encounter: Payer: Self-pay | Admitting: Adult Health

## 2012-04-08 VITALS — BP 136/62 | HR 68 | Ht <= 58 in | Wt 195.0 lb

## 2012-04-08 DIAGNOSIS — I359 Nonrheumatic aortic valve disorder, unspecified: Secondary | ICD-10-CM

## 2012-04-08 DIAGNOSIS — I350 Nonrheumatic aortic (valve) stenosis: Secondary | ICD-10-CM | POA: Insufficient documentation

## 2012-04-08 DIAGNOSIS — I10 Essential (primary) hypertension: Secondary | ICD-10-CM

## 2012-04-08 NOTE — Assessment & Plan Note (Signed)
Echocardiogram completed on 04/05/2012 demonstrated normal LV systolic function with a range of 60-65%. Aortic valve was mildly to moderately calcified with mildly thickened moderately calcified leaflets. Cusp separation was moderately reduced. There was mild to moderate stenosis with trivial regurg her about value was 1.33 cm2  VTI and 1.21 cm 2 by Vmax. Her echo reader aortic stenosis has progressed. Although she is asymptomatic for chest discomfort and shortness of breath. We will keep a close eye on this. I explained the test results to her and told her that we will need to have a followup echo in one year. If she becomes short of breath, having chest pain, or syncopal episodes, we will need to see her sooner

## 2012-04-08 NOTE — Assessment & Plan Note (Signed)
Blood pressure remains well-controlled at 136/62 despite changes in medication. She is without symptoms currently for dizziness or presyncope. We will continue her on the lower dose of Norvasc and without any diuretic at this time. We can certainly give her back HCTZ to use when necessary for fluid retention should this occur with the use of calcium channel blocker. At this time I find no evidence of edema.

## 2012-04-08 NOTE — Patient Instructions (Addendum)
Your physician wants you to follow-up in: 6 months with Kathryn Lawrence, NP. You will receive a reminder letter in the mail two months in advance. If you don't receive a letter, please call our office to schedule the follow-up appointment.  
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
## 2012-05-13 DIAGNOSIS — Z23 Encounter for immunization: Secondary | ICD-10-CM | POA: Diagnosis not present

## 2012-05-20 DIAGNOSIS — R1310 Dysphagia, unspecified: Secondary | ICD-10-CM | POA: Diagnosis not present

## 2012-05-20 DIAGNOSIS — J3800 Paralysis of vocal cords and larynx, unspecified: Secondary | ICD-10-CM | POA: Diagnosis not present

## 2012-05-21 ENCOUNTER — Other Ambulatory Visit: Payer: Self-pay | Admitting: Cardiology

## 2012-05-21 ENCOUNTER — Other Ambulatory Visit: Payer: Self-pay | Admitting: *Deleted

## 2012-05-21 MED ORDER — AMLODIPINE BESYLATE 5 MG PO TABS: 5.0000 mg | ORAL_TABLET | Freq: Every day | ORAL | Status: DC

## 2012-05-31 ENCOUNTER — Ambulatory Visit (INDEPENDENT_AMBULATORY_CARE_PROVIDER_SITE_OTHER): Payer: Medicare Other | Admitting: Family Medicine

## 2012-05-31 ENCOUNTER — Encounter: Payer: Self-pay | Admitting: Family Medicine

## 2012-05-31 VITALS — BP 124/70 | HR 80 | Temp 98.7°F | Ht 58.5 in | Wt 183.0 lb

## 2012-05-31 DIAGNOSIS — I350 Nonrheumatic aortic (valve) stenosis: Secondary | ICD-10-CM

## 2012-05-31 DIAGNOSIS — C8589 Other specified types of non-Hodgkin lymphoma, extranodal and solid organ sites: Secondary | ICD-10-CM

## 2012-05-31 DIAGNOSIS — I359 Nonrheumatic aortic valve disorder, unspecified: Secondary | ICD-10-CM

## 2012-05-31 DIAGNOSIS — S88119A Complete traumatic amputation at level between knee and ankle, unspecified lower leg, initial encounter: Secondary | ICD-10-CM

## 2012-05-31 DIAGNOSIS — I10 Essential (primary) hypertension: Secondary | ICD-10-CM

## 2012-05-31 DIAGNOSIS — I2510 Atherosclerotic heart disease of native coronary artery without angina pectoris: Secondary | ICD-10-CM

## 2012-05-31 DIAGNOSIS — E119 Type 2 diabetes mellitus without complications: Secondary | ICD-10-CM

## 2012-05-31 DIAGNOSIS — I739 Peripheral vascular disease, unspecified: Secondary | ICD-10-CM

## 2012-05-31 DIAGNOSIS — E785 Hyperlipidemia, unspecified: Secondary | ICD-10-CM | POA: Diagnosis not present

## 2012-05-31 DIAGNOSIS — D649 Anemia, unspecified: Secondary | ICD-10-CM

## 2012-05-31 DIAGNOSIS — Z89511 Acquired absence of right leg below knee: Secondary | ICD-10-CM

## 2012-05-31 DIAGNOSIS — I7090 Unspecified atherosclerosis: Secondary | ICD-10-CM | POA: Diagnosis not present

## 2012-05-31 DIAGNOSIS — C8590 Non-Hodgkin lymphoma, unspecified, unspecified site: Secondary | ICD-10-CM

## 2012-05-31 NOTE — Patient Instructions (Addendum)
-  please schedule a 30 minute appointment to discuss advanced directives  -please follow up for other issues in 3-4 months  -please sign up for mychart if you have not already
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
## 2012-06-06 DIAGNOSIS — E1149 Type 2 diabetes mellitus with other diabetic neurological complication: Secondary | ICD-10-CM | POA: Diagnosis not present

## 2012-06-06 DIAGNOSIS — L851 Acquired keratosis [keratoderma] palmaris et plantaris: Secondary | ICD-10-CM | POA: Diagnosis not present

## 2012-06-11 ENCOUNTER — Ambulatory Visit: Payer: Medicare Other | Admitting: Family Medicine

## 2012-08-08 DIAGNOSIS — M069 Rheumatoid arthritis, unspecified: Secondary | ICD-10-CM | POA: Diagnosis not present

## 2012-08-14 DIAGNOSIS — J3800 Paralysis of vocal cords and larynx, unspecified: Secondary | ICD-10-CM | POA: Diagnosis not present

## 2012-08-14 DIAGNOSIS — R0989 Other specified symptoms and signs involving the circulatory and respiratory systems: Secondary | ICD-10-CM | POA: Diagnosis not present

## 2012-08-14 DIAGNOSIS — R0609 Other forms of dyspnea: Secondary | ICD-10-CM | POA: Diagnosis not present

## 2012-08-14 DIAGNOSIS — G479 Sleep disorder, unspecified: Secondary | ICD-10-CM | POA: Diagnosis not present

## 2012-08-19 ENCOUNTER — Ambulatory Visit: Payer: Medicare Other | Attending: Family Medicine | Admitting: Sleep Medicine

## 2012-08-19 DIAGNOSIS — G4733 Obstructive sleep apnea (adult) (pediatric): Secondary | ICD-10-CM | POA: Insufficient documentation

## 2012-08-19 DIAGNOSIS — Z6838 Body mass index (BMI) 38.0-38.9, adult: Secondary | ICD-10-CM | POA: Insufficient documentation

## 2012-08-19 DIAGNOSIS — G4730 Sleep apnea, unspecified: Secondary | ICD-10-CM

## 2012-08-20 DIAGNOSIS — G4710 Hypersomnia, unspecified: Secondary | ICD-10-CM | POA: Diagnosis not present

## 2012-08-20 DIAGNOSIS — G4730 Sleep apnea, unspecified: Secondary | ICD-10-CM | POA: Diagnosis not present

## 2012-08-28 ENCOUNTER — Other Ambulatory Visit: Payer: Self-pay | Admitting: Family Medicine

## 2012-08-28 DIAGNOSIS — E119 Type 2 diabetes mellitus without complications: Secondary | ICD-10-CM

## 2012-08-28 MED ORDER — GLUCOSE BLOOD VI STRP: ORAL_STRIP | Status: DC

## 2012-08-28 NOTE — Telephone Encounter (Signed)
Pt needs strips for [facility]. [facility] [place], [place]  Pt has never gotten them from here before. Pt was getting from [facility], but they said pt needs a script for strips. Pt has one day of strips left.

## 2012-08-28 NOTE — Telephone Encounter (Signed)
[facility] and was told that only [facility] had the [facility] stripts.  Called and spoke with pt and pt is aware that strips will be sent to [facility] in [place] .  Strips sent to pharmacy.

## 2012-09-02 NOTE — Procedures (Signed)
[facility] Ronnae Kaser A. [name], MD     [email or web address]        NAMEMARNEE, [name]          ACCOUNT NO.:  [id number]  MEDICAL RECORD NO.:  [id number]          PATIENT TYPE:  OUT  LOCATION:  SLEEP LAB                     FACILITY:  [facility]  PHYSICIAN:  Aeisha Minarik A. [name], M.D. DATE OF BIRTH:  1928/04/22  DATE OF STUDY:  08/19/2012                           NOCTURNAL POLYSOMNOGRAM  REFERRING PHYSICIAN:  [name]  INDICATION:  An 79 year old, who presents with hypersomnia, fatigue, and snoring.   MEDICATIONS:  Aspirin, losartan, prednisone, pravastatin, ranitidine, Tylenol, amlodipine, Carvedilol, clopidogrel, vitamin, and Janumet.  EPWORTH SLEEPINESS SCALE:  17.  BMI:  38.  ARCHITECTURAL SUMMARY:  The total recording time is 409 minutes.  Sleep efficiency 41%.  Sleep latency 46 minutes.  REM latency 157 minutes. Stage N1 of 70%, N2 of 42%, N3 of 32%, and REM sleep 10%.  RESPIRATORY SUMMARY:  Baseline oxygen saturation is 97.  Lowest saturation 81 during REM sleep.  Diagnostic AHI is 10 and RDI also 10.  LIMB MOVEMENT SUMMARY:  PLM index 14.  ELECTROCARDIOGRAM SUMMARY:  Average heart rate is 67.  IMPRESSION: 1. Mild obstructive sleep apnea syndrome, not requiring positive     pressure treatment. 2. Mild periodic limb movement disorder sleep. 3. Abnormal sleep architecture with markedly reduced sleep efficiency.     No clear polysomnographic cause is detected.  This could be a Sleep     Lab effect.     [name] A. [name], M.D.    KAD/MEDQ  D:  09/02/2012 09:35:34  T:  09/02/2012 09:49:05  Job:  [id number]

## 2012-09-05 ENCOUNTER — Encounter: Payer: Self-pay | Admitting: Family Medicine

## 2012-09-05 ENCOUNTER — Ambulatory Visit (INDEPENDENT_AMBULATORY_CARE_PROVIDER_SITE_OTHER): Payer: Medicare Other | Admitting: Family Medicine

## 2012-09-05 VITALS — BP 132/70 | HR 79 | Temp 98.6°F | Wt 190.0 lb

## 2012-09-05 DIAGNOSIS — E785 Hyperlipidemia, unspecified: Secondary | ICD-10-CM

## 2012-09-05 DIAGNOSIS — D649 Anemia, unspecified: Secondary | ICD-10-CM

## 2012-09-05 DIAGNOSIS — I679 Cerebrovascular disease, unspecified: Secondary | ICD-10-CM

## 2012-09-05 DIAGNOSIS — E119 Type 2 diabetes mellitus without complications: Secondary | ICD-10-CM | POA: Diagnosis not present

## 2012-09-05 DIAGNOSIS — I10 Essential (primary) hypertension: Secondary | ICD-10-CM

## 2012-09-05 LAB — LIPID PANEL
HDL: 75.3 mg/dL (ref >39.00)
Total CHOL/HDL Ratio: 2
Triglycerides: 99 mg/dL (ref 0.0–149.0)

## 2012-09-05 LAB — BASIC METABOLIC PANEL
CO2: 30 mEq/L (ref 19–32)
Calcium: 9.5 mg/dL (ref 8.4–10.5)
GFR: 82.32 mL/min (ref >60.00)
Sodium: 139 mEq/L (ref 135–145)
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
## 2012-09-06 ENCOUNTER — Telehealth: Payer: Self-pay | Admitting: Family Medicine

## 2012-09-06 ENCOUNTER — Telehealth: Payer: Self-pay

## 2012-09-06 NOTE — Telephone Encounter (Signed)
Please let her know: Labs look pretty good, mild elevated diabetes lab - but not change in medications at this time. Encourage to eat a healthy diet and try to decrease breads, sweets, white carbs.

## 2012-09-06 NOTE — Telephone Encounter (Signed)
Yes, that is fine. 

## 2012-09-06 NOTE — Telephone Encounter (Signed)
Dorathy Kinsman from [employer] called and requested office notes from pt's visit on 09/05/12 and demographics.  Ms. Katrinka Blazing states this is needed for pt's new mattress for hospital bed.  This needs to be faxed to [phone].    Pls advise if ok to send.

## 2012-09-10 DIAGNOSIS — L851 Acquired keratosis [keratoderma] palmaris et plantaris: Secondary | ICD-10-CM | POA: Diagnosis not present

## 2012-09-10 DIAGNOSIS — E1149 Type 2 diabetes mellitus with other diabetic neurological complication: Secondary | ICD-10-CM | POA: Diagnosis not present

## 2012-09-10 NOTE — Telephone Encounter (Signed)
Sent on 09/08/12.

## 2012-09-10 NOTE — Telephone Encounter (Signed)
Left a message for return call.  

## 2012-09-16 NOTE — Telephone Encounter (Signed)
Called and spoke with pt and pt is aware.  

## 2012-09-26 DIAGNOSIS — Z1231 Encounter for screening mammogram for malignant neoplasm of breast: Secondary | ICD-10-CM | POA: Diagnosis not present

## 2012-10-02 ENCOUNTER — Encounter: Payer: Self-pay | Admitting: Adult Health

## 2012-10-02 ENCOUNTER — Ambulatory Visit (INDEPENDENT_AMBULATORY_CARE_PROVIDER_SITE_OTHER): Payer: Medicare Other | Admitting: Adult Health

## 2012-10-02 VITALS — BP 142/70 | HR 66 | Ht 60.0 in | Wt 203.0 lb

## 2012-10-02 DIAGNOSIS — I7090 Unspecified atherosclerosis: Secondary | ICD-10-CM

## 2012-10-02 DIAGNOSIS — I2510 Atherosclerotic heart disease of native coronary artery without angina pectoris: Secondary | ICD-10-CM

## 2012-10-02 DIAGNOSIS — I10 Essential (primary) hypertension: Secondary | ICD-10-CM | POA: Diagnosis not present

## 2012-10-02 DIAGNOSIS — I350 Nonrheumatic aortic (valve) stenosis: Secondary | ICD-10-CM

## 2012-10-02 DIAGNOSIS — I359 Nonrheumatic aortic valve disorder, unspecified: Secondary | ICD-10-CM

## 2012-10-02 DIAGNOSIS — E669 Obesity, unspecified: Secondary | ICD-10-CM

## 2012-10-02 NOTE — Assessment & Plan Note (Signed)
She continues to have systolic murmur. Blood pressure control is essential. Will increase amlodipine to 10 mg daily should this remain elevated. Not repeat echo until next office visit in 6 months.

## 2012-10-02 NOTE — Patient Instructions (Addendum)
Your physician recommends that you schedule a follow-up appointment in: 6 months  Your physician has recommended you make the following change in your medication:  1 - Take HCTZ as needed only

## 2012-10-02 NOTE — Assessment & Plan Note (Signed)
She is without complaints of chest pain, dyspnea on exertion or pressure. We will not plan any cardiac testing at this time as she is asymptomatic. Will continue risk management. She will continue on aspirin 81 mg daily, Plavix, and statin. We will see her in 6 months unless he becomes symptomatic.

## 2012-10-02 NOTE — Assessment & Plan Note (Signed)
She is advised to increase her activity, she does have a below-the-knee amputation on the right. She is limited in her ambulation however I have advised her to do as much as she can and to watch her caloric intake.
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
## 2012-10-02 NOTE — Assessment & Plan Note (Signed)
Blood pressure slightly elevated on this visit. She has gained approximately 10 pounds since being seen one month ago. She is not complaining of any dyspnea on exertion or fluid retention. She has not been very active. We will not make any medication adjustments at this time as this is a one-time noted minor elevation. However looking at trends since November 2013 her blood pressures been running 124/70 up to 142/70 on this visit. We took her off her HCTZ for couple months ago she was unable to tolerate it secondary to dizziness. He may need to consider starting her on an additional medication if she is unable to keep her pressure down on current medications. I advised her to take HCTZ when necessary for any evidence of fluid retention. This can be done up to twice a week only. We will see her again in 6 months unless she becomes symptomatic.

## 2012-10-02 NOTE — Progress Notes (Deleted)
Name: [name]    DOB: 1927-11-14  Age: 79 y.o.  MR#: [id number]       PCP:  [name]., DO      Insurance: Payor: MEDICARE  Plan: MEDICARE PART A AND B  Product Type: *No Product type*    CC:    Chief Complaint  Patient presents with  . Coronary Artery Disease  . Hypertension  . Aortic Stenosis    VS Filed Vitals:   10/02/12 1351  BP: 142/70  Pulse: 66  Height: 5' (1.524 m)  Weight: 203 lb (92.08 kg)    Weights Current Weight  10/02/12 203 lb (92.08 kg)  09/05/12 190 lb (86.183 kg)  05/31/12 183 lb (83.008 kg)    Blood Pressure  BP Readings from Last 3 Encounters:  10/02/12 142/70  09/05/12 132/70  05/31/12 124/70     Admit date:  (Not on file) Last encounter with RMR:  04/08/2012   Allergy [place]cia  Current Outpatient Prescriptions  Medication Sig Dispense Refill  . acetaminophen (TYLENOL) 325 MG tablet Take 650 mg by mouth as needed.        Marland Kitchen amLODipine (NORVASC) 5 MG tablet Take 1 tablet (5 mg total) by mouth daily.  90 tablet  3  . aspirin 81 MG tablet Take 81 mg by mouth daily.      . carvedilol (COREG) 25 MG tablet Take 12.5 mg by mouth daily.      . clopidogrel (PLAVIX) 75 MG tablet Take 75 mg by mouth daily.        Marland Kitchen docusate sodium (COLACE) 100 MG capsule Take 100 mg by mouth 2 (two) times daily.        . fluconazole (DIFLUCAN) 100 MG tablet Take 100 mg by mouth as needed.       Marland Kitchen glucose blood test strip Use as instructed  100 each  12  . losartan (COZAAR) 100 MG tablet Take 100 mg by mouth daily.        Marland Kitchen MENTHOL-METHYL SALICYLATE EX Apply topically 3 (three) times daily as needed.      . Multiple Vitamin (MULTIVITAMIN) tablet Take 1 tablet by mouth daily.        . pravastatin (PRAVACHOL) 40 MG tablet Take 40 mg by mouth daily.        . predniSONE (DELTASONE) 10 MG tablet Take 5 mg by mouth daily.       . sitaGLIPtan-metformin (JANUMET) 50-500 MG per tablet Take 1 tablet by mouth daily.        No current facility-administered medications  for this visit.    Discontinued Meds:    Medications Discontinued During This Encounter  Medication Reason  . carbamazepine (TEGRETOL) 100 MG chewable tablet Error    Patient Active Problem List  Diagnosis  . DIABETES MELLITUS, TYPE II  . Hyperlipidemia  . OVERACTIVE BLADDER  . OBESITY  . Hypertension  . Arteriosclerotic cardiovascular disease (ASCVD)  . Cerebrovascular disease  . Peripheral vascular disease  . Degenerative joint disease  . Lymphoma  . Restless leg syndrome  . Anemia  . Syncope  . Aortic valve stenosis  . Hx of right BKA    LABS    Component Value Date/Time   NA 139 09/05/2012 1059   NA 139 01/31/2012 1305   NA 143 12/07/2011 1145   K 4.6 09/05/2012 1059   K 4.8 01/31/2012 1305   K 4.6 12/07/2011 1145   CL 103 09/05/2012 1059   CL 101 01/31/2012 1305  CL 102 12/07/2011 1145   CO2 30 09/05/2012 1059   CO2 31 01/31/2012 1305   CO2 27 12/07/2011 1145   GLUCOSE 162* 09/05/2012 1059   GLUCOSE 156* 01/31/2012 1305   GLUCOSE 153* 12/07/2011 1145   BUN 20 09/05/2012 1059   BUN 29* 01/31/2012 1305   BUN 22 12/07/2011 1145   CREATININE 0.9 09/05/2012 1059   CREATININE 0.94 01/31/2012 1305   CREATININE 0.80 12/07/2011 1145   CREATININE 0.77 10/26/2011 1336   CREATININE 0.62 01/29/2011 2301   CREATININE 0.77 01/16/2011 0345   CALCIUM 9.5 09/05/2012 1059   CALCIUM 9.9 01/31/2012 1305   CALCIUM 9.7 12/07/2011 1145   GFRNONAA >60 01/29/2011 2301   GFRNONAA >60 01/16/2011 0345   GFRNONAA >60 01/15/2011 1336   GFRAA >60 01/29/2011 2301   GFRAA >60 01/16/2011 0345   GFRAA >60 01/15/2011 1336   CMP     Component Value Date/Time   NA 139 09/05/2012 1059   K 4.6 09/05/2012 1059   CL 103 09/05/2012 1059   CO2 30 09/05/2012 1059   GLUCOSE 162* 09/05/2012 1059   BUN 20 09/05/2012 1059   CREATININE 0.9 09/05/2012 1059   CREATININE 0.94 01/31/2012 1305   CALCIUM 9.5 09/05/2012 1059   PROT 6.5 12/07/2011 1145   ALBUMIN 3.8 12/07/2011 1145   AST 17 12/07/2011 1145   ALT 8 12/07/2011 1145   ALKPHOS 65 12/07/2011 1145   BILITOT 0.2*  12/07/2011 1145   GFRNONAA >60 01/29/2011 2301   GFRAA >60 01/29/2011 2301       Component Value Date/Time   WBC 7.0 12/07/2011 1145   WBC 7.0 10/26/2011 1336   WBC 7.5 01/29/2011 2301   HGB 10.7* 12/07/2011 1145   HGB 10.8* 10/26/2011 1336   HGB 10.2* 01/29/2011 2301   HCT 34.7* 12/07/2011 1145   HCT 35.7* 10/26/2011 1336   HCT 31.3* 01/29/2011 2301   MCV 86.3 12/07/2011 1145   MCV 85.8 10/26/2011 1336   MCV 82.2 01/29/2011 2301    Lipid Panel     Component Value Date/Time   CHOL 137 09/05/2012 1059   TRIG 99.0 09/05/2012 1059   HDL 75.30 09/05/2012 1059   CHOLHDL 2 09/05/2012 1059   VLDL 19.8 09/05/2012 1059   LDLCALC 42 09/05/2012 1059    ABG    Component Value Date/Time   HCO3 31.4* 06/03/2007 1747   TCO2 30 10/14/2010 1622     Lab Results  Component Value Date   TSH 1.277 01/16/2011   BNP (last 3 results) No results found for this basename: PROBNP,  in the last 8760 hours Cardiac Panel (last 3 results) No results found for this basename: CKTOTAL, CKMB, TROPONINI, RELINDX,  in the last 72 hours  Iron/TIBC/Ferritin    Component Value Date/Time   IRON 18* 12/01/2010 1230   TIBC 226* 12/01/2010 1230   FERRITIN 138 12/01/2010 1230     EKG Orders placed in visit on 10/02/12  . EKG 12-LEAD     Prior Assessment and Plan Problem List as of 10/02/2012     ICD-9-CM     Cardiology Problems   Hyperlipidemia   Last Assessment & Plan   06/30/2011 Office Visit Written 06/30/2011  8:29 PM by Kathlen Brunswick, MD     Excellent control of hyperlipidemia when last assessed more than one year ago.  Repeat laboratory studies will be obtained.    Hypertension   Last Assessment & Plan   04/08/2012 Office Visit Written 04/08/2012  5:15 PM by  Jodelle Gross, NP     Blood pressure remains well-controlled at 136/62 despite changes in medication. She is without symptoms currently for dizziness or presyncope. We will continue her on the lower dose of Norvasc and without any diuretic at this time. We can certainly  give her back HCTZ to use when necessary for fluid retention should this occur with the use of calcium channel blocker. At this time I find no evidence of edema.    Arteriosclerotic cardiovascular disease (ASCVD)   Last Assessment & Plan   10/26/2011 Office Visit Edited 10/27/2011  3:25 PM by Kathlen Brunswick, MD     Continues asymptomatic with respect to coronary disease.  We will persist in our attempt to optimally control risk factors.  Aortic stenosis remains mild by physical examination.    Cerebrovascular disease   Last Assessment & Plan   03/28/2012 Office Visit Written 03/28/2012  5:15 PM by Jodelle Gross, NP     Most recent carotid ultrasound was completed on 06/14/2007 which revealed port sugars carotid systems with minimal plaque formation. Elevated peak systolic velocity in ICA/CCA ratio seen at the right ICA the visualized plaque only. Mild in degree. The velocities obtained corresponds with 50-69% diameter stenosis of the right ICA, although this may be overestimated due to tortuosity of the carotid vessels. We will not repeat the Doppler study at this time. Doubt this is etiology of her dizziness. If it continues to be an issue with changes in antihypertensives, will pursue further evaluation with a repeat Doppler study.     Peripheral vascular disease   Last Assessment & Plan   06/30/2011 Office Visit Written 06/30/2011  8:34 PM by Kathlen Brunswick, MD     The patient remains asymptomatic with respect to peripheral vascular disease and requires no further treatment or testing at the present time.    Syncope   Last Assessment & Plan   10/26/2011 Office Visit Written 10/26/2011  1:33 PM by Kathlen Brunswick, MD     Patient reports difficulty maintaining balance, but does well with a walker.  Previous "syncopal spells" may have represented falls.  Additional testing will be deferred until a repeat episode occurs.    Aortic valve stenosis   Last Assessment & Plan   04/08/2012  Office Visit Written 04/08/2012  5:18 PM by Jodelle Gross, NP     Echocardiogram completed on 04/05/2012 demonstrated normal LV systolic function with a range of 60-65%. Aortic valve was mildly to moderately calcified with mildly thickened moderately calcified leaflets. Cusp separation was moderately reduced. There was mild to moderate stenosis with trivial regurg her about value was 1.33 cm2  VTI and 1.21 cm 2 by Vmax. Her echo reader aortic stenosis has progressed. Although she is asymptomatic for chest discomfort and shortness of breath. We will keep a close eye on this. I explained the test results to her and told her that we will need to have a followup echo in one year. If she becomes short of breath, having chest pain, or syncopal episodes, we will need to see her sooner      Other   DIABETES MELLITUS, TYPE II   OVERACTIVE BLADDER   OBESITY   Degenerative joint disease   Lymphoma   Restless leg syndrome   Anemia   Last Assessment & Plan   10/26/2011 Office Visit Edited 10/27/2011  3:24 PM by Kathlen Brunswick, MD     Patient has still not apparently been evaluated for anemia.  She reports a negative screening colonoscopy 4 years ago.  Iron studies are suggestive of iron deficiency, but ferritin was normal.  Stool for Hemoccult testing will be obtained.  Dr. Chestine Spore may wish to refer to Hematology or GI for further evaluation.    Hx of right BKA       Imaging: No results found.

## 2012-10-18 ENCOUNTER — Other Ambulatory Visit: Payer: Self-pay | Admitting: Family Medicine

## 2012-10-22 ENCOUNTER — Telehealth: Payer: Self-pay | Admitting: Family Medicine

## 2012-10-22 MED ORDER — LOSARTAN POTASSIUM 100 MG PO TABS: 100.0000 mg | ORAL_TABLET | Freq: Every day | ORAL | Status: DC

## 2012-10-22 NOTE — Telephone Encounter (Signed)
Rx sent to pharmacy   

## 2012-10-22 NOTE — Telephone Encounter (Signed)
Patient's daughter called stating that she need a refill of her losartan,potassium 100mg  1poqd sent to [facility] in [place].

## 2012-11-14 ENCOUNTER — Telehealth: Payer: Self-pay | Admitting: Family Medicine

## 2012-11-14 ENCOUNTER — Ambulatory Visit (INDEPENDENT_AMBULATORY_CARE_PROVIDER_SITE_OTHER): Payer: Medicare Other | Admitting: Family Medicine

## 2012-11-14 ENCOUNTER — Encounter: Payer: Self-pay | Admitting: Family Medicine

## 2012-11-14 VITALS — BP 120/76 | HR 88 | Temp 98.6°F | Wt 202.0 lb

## 2012-11-14 DIAGNOSIS — E785 Hyperlipidemia, unspecified: Secondary | ICD-10-CM

## 2012-11-14 DIAGNOSIS — D509 Iron deficiency anemia, unspecified: Secondary | ICD-10-CM

## 2012-11-14 DIAGNOSIS — I10 Essential (primary) hypertension: Secondary | ICD-10-CM

## 2012-11-14 DIAGNOSIS — J309 Allergic rhinitis, unspecified: Secondary | ICD-10-CM

## 2012-11-14 DIAGNOSIS — R5381 Other malaise: Secondary | ICD-10-CM | POA: Diagnosis not present

## 2012-11-14 DIAGNOSIS — E119 Type 2 diabetes mellitus without complications: Secondary | ICD-10-CM

## 2012-11-14 DIAGNOSIS — E559 Vitamin D deficiency, unspecified: Secondary | ICD-10-CM

## 2012-11-14 DIAGNOSIS — E538 Deficiency of other specified B group vitamins: Secondary | ICD-10-CM

## 2012-11-14 DIAGNOSIS — I679 Cerebrovascular disease, unspecified: Secondary | ICD-10-CM

## 2012-11-14 LAB — BASIC METABOLIC PANEL
CO2: 30 mEq/L (ref 19–32)
Calcium: 9.6 mg/dL (ref 8.4–10.5)
GFR: 72.37 mL/min (ref >60.00)
Glucose, Bld: 137 mg/dL — ABNORMAL HIGH (ref 70–99)
Potassium: 4.5 mEq/L (ref 3.5–5.1)
Sodium: 142 mEq/L (ref 135–145)

## 2012-11-14 LAB — CBC WITH DIFFERENTIAL/PLATELET
Basophils Absolute: 0 10*3/uL (ref 0.0–0.1)
Eosinophils Absolute: 0.1 10*3/uL (ref 0.0–0.7)
HCT: 34.5 % — ABNORMAL LOW (ref 36.0–46.0)
Hemoglobin: 11 g/dL — ABNORMAL LOW (ref 12.0–15.0)
Lymphs Abs: 1 10*3/uL (ref 0.7–4.0)
MCHC: 31.9 g/dL (ref 30.0–36.0)
MCV: 80.7 fl (ref 78.0–100.0)
Monocytes Absolute: 0.5 10*3/uL (ref 0.1–1.0)
Neutro Abs: 4.6 10*3/uL (ref 1.4–7.7)
RDW: 14.8 % — ABNORMAL HIGH (ref 11.5–14.6)

## 2012-11-14 MED ORDER — FLUTICASONE PROPIONATE 50 MCG/ACT NA SUSP: 2.0000 | Freq: Every day | NASAL | Status: DC

## 2012-11-14 NOTE — Telephone Encounter (Signed)
Called and left a detailed message for pt at designated home number.   

## 2012-11-14 NOTE — Telephone Encounter (Signed)
Labs look good. Anemia is improved.
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
## 2012-11-21 DIAGNOSIS — L851 Acquired keratosis [keratoderma] palmaris et plantaris: Secondary | ICD-10-CM | POA: Diagnosis not present

## 2012-11-21 DIAGNOSIS — E1149 Type 2 diabetes mellitus with other diabetic neurological complication: Secondary | ICD-10-CM | POA: Diagnosis not present

## 2012-12-16 ENCOUNTER — Telehealth: Payer: Self-pay

## 2012-12-16 NOTE — Telephone Encounter (Signed)
See what she is taking and refill for 1 year. Thanks.

## 2012-12-16 NOTE — Telephone Encounter (Signed)
Rx request for Janument xr 50-1000 mg- take 1 tablet by mouth every day.  Pt last seen 11/14/12.  Pls advise.

## 2012-12-17 NOTE — Telephone Encounter (Signed)
Called and left a detailed message for pt to call to verify rx.

## 2012-12-18 MED ORDER — SITAGLIPTIN PHOS-METFORMIN HCL 50-1000 MG PO TABS: 1.0000 | ORAL_TABLET | Freq: Every day | ORAL | Status: DC

## 2012-12-18 NOTE — Telephone Encounter (Signed)
Pt's daughter called and verified rx as janumet xr 50-1000.   Rx sent to pharmacy.

## 2012-12-18 NOTE — Addendum Note (Signed)
Addended by: Azucena Freed on: 12/18/2012 08:49 AM   Modules accepted: Orders

## 2012-12-27 ENCOUNTER — Encounter: Payer: Self-pay | Admitting: Family Medicine

## 2012-12-27 ENCOUNTER — Ambulatory Visit (INDEPENDENT_AMBULATORY_CARE_PROVIDER_SITE_OTHER): Payer: Medicare Other | Admitting: Family Medicine

## 2012-12-27 VITALS — BP 130/76 | Temp 98.4°F | Wt 204.0 lb

## 2012-12-27 DIAGNOSIS — R300 Dysuria: Secondary | ICD-10-CM

## 2012-12-27 LAB — POCT URINALYSIS DIPSTICK
Glucose, UA: NEGATIVE
Nitrite, UA: NEGATIVE
Protein, UA: NEGATIVE
Spec Grav, UA: 1.025
Urobilinogen, UA: 0.2

## 2012-12-27 MED ORDER — CIPROFLOXACIN HCL 250 MG PO TABS: 250.0000 mg | ORAL_TABLET | Freq: Two times a day (BID) | ORAL | Status: DC

## 2012-12-27 NOTE — Patient Instructions (Addendum)
-  drink plenty of fluids - cranberry and blueberry juice can be helpful  -can use Azo for symptoms as instructed on package  -As we discussed, we have prescribed a new medication for you at this appointment. We discussed the common and serious potential adverse effects of this medication and you can review these and more with the pharmacist when you pick up your medication.  Please follow the instructions for use carefully and notify us immediately if you have any problems taking this medication.  -follow up if worsening or not improving
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
## 2012-12-29 LAB — URINE CULTURE

## 2013-01-02 ENCOUNTER — Encounter: Payer: Self-pay | Admitting: Family Medicine

## 2013-01-02 ENCOUNTER — Ambulatory Visit (INDEPENDENT_AMBULATORY_CARE_PROVIDER_SITE_OTHER): Payer: Medicare Other | Admitting: Family Medicine

## 2013-01-02 VITALS — BP 138/70 | Temp 98.0°F | Wt 204.0 lb

## 2013-01-02 DIAGNOSIS — I10 Essential (primary) hypertension: Secondary | ICD-10-CM | POA: Diagnosis not present

## 2013-01-02 DIAGNOSIS — E785 Hyperlipidemia, unspecified: Secondary | ICD-10-CM | POA: Diagnosis not present

## 2013-01-02 DIAGNOSIS — E119 Type 2 diabetes mellitus without complications: Secondary | ICD-10-CM | POA: Diagnosis not present

## 2013-01-02 DIAGNOSIS — I7090 Unspecified atherosclerosis: Secondary | ICD-10-CM

## 2013-01-02 DIAGNOSIS — T148XXA Other injury of unspecified body region, initial encounter: Secondary | ICD-10-CM

## 2013-01-02 DIAGNOSIS — S88119A Complete traumatic amputation at level between knee and ankle, unspecified lower leg, initial encounter: Secondary | ICD-10-CM

## 2013-01-02 DIAGNOSIS — I2510 Atherosclerotic heart disease of native coronary artery without angina pectoris: Secondary | ICD-10-CM

## 2013-01-02 DIAGNOSIS — Z89511 Acquired absence of right leg below knee: Secondary | ICD-10-CM

## 2013-01-02 DIAGNOSIS — IMO0002 Reserved for concepts with insufficient information to code with codable children: Secondary | ICD-10-CM

## 2013-01-02 LAB — MICROALBUMIN / CREATININE URINE RATIO: Microalb, Ur: 1.6 mg/dL (ref 0.0–1.9)

## 2013-01-02 LAB — BASIC METABOLIC PANEL
BUN: 22 mg/dL (ref 6–23)
CO2: 29 mEq/L (ref 19–32)
Chloride: 104 mEq/L (ref 96–112)
Glucose, Bld: 151 mg/dL — ABNORMAL HIGH (ref 70–99)
Potassium: 3.7 mEq/L (ref 3.5–5.1)
Sodium: 140 mEq/L (ref 135–145)

## 2013-01-02 MED ORDER — SITAGLIPTIN PHOS-METFORMIN HCL 50-1000 MG PO TABS: ORAL_TABLET | ORAL | Status: DC

## 2013-01-02 NOTE — Progress Notes (Signed)
Quick Note:  Called and spoke with pt's daughter and she is aware. New rx sent to pharmacy. ______

## 2013-01-02 NOTE — Addendum Note (Signed)
Addended by: Azucena Freed on: 01/02/2013 03:32 PM   Modules accepted: Orders
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
## 2013-01-03 ENCOUNTER — Ambulatory Visit: Payer: Medicare Other | Admitting: Family Medicine

## 2013-01-06 DIAGNOSIS — J3801 Paralysis of vocal cords and larynx, unilateral: Secondary | ICD-10-CM | POA: Diagnosis not present

## 2013-01-07 DIAGNOSIS — Z961 Presence of intraocular lens: Secondary | ICD-10-CM | POA: Diagnosis not present

## 2013-01-07 DIAGNOSIS — E119 Type 2 diabetes mellitus without complications: Secondary | ICD-10-CM | POA: Diagnosis not present

## 2013-01-16 ENCOUNTER — Other Ambulatory Visit: Payer: Self-pay | Admitting: Cardiology

## 2013-01-28 ENCOUNTER — Telehealth: Payer: Self-pay | Admitting: Family Medicine

## 2013-01-28 NOTE — Telephone Encounter (Signed)
Pt needs a new rx for right leg prothesis sent to  [facility] [phone] attn Roland Earl

## 2013-01-28 NOTE — Telephone Encounter (Signed)
Pls advise.  

## 2013-01-28 NOTE — Telephone Encounter (Signed)
Filled out form for this last visit. Please call and see want needs to be on Rx and then write rx for [place] to fax. Thanks.

## 2013-01-30 NOTE — Telephone Encounter (Signed)
Form faxed on 01/28/13.

## 2013-02-11 DIAGNOSIS — L03039 Cellulitis of unspecified toe: Secondary | ICD-10-CM | POA: Diagnosis not present

## 2013-02-11 DIAGNOSIS — L02619 Cutaneous abscess of unspecified foot: Secondary | ICD-10-CM | POA: Diagnosis not present

## 2013-02-11 MED ORDER — NONFORMULARY OR COMPOUNDED ITEM: Status: DC

## 2013-02-11 NOTE — Addendum Note (Signed)
Addended by: Azucena Freed on: 02/11/2013 09:59 AM   Modules accepted: Orders

## 2013-02-11 NOTE — Telephone Encounter (Signed)
Precription for replacement socket faxed to [phone] to [facility].

## 2013-02-13 ENCOUNTER — Other Ambulatory Visit: Payer: Self-pay | Admitting: Family Medicine

## 2013-02-25 DIAGNOSIS — M064 Inflammatory polyarthropathy: Secondary | ICD-10-CM | POA: Diagnosis not present

## 2013-03-11 ENCOUNTER — Ambulatory Visit (INDEPENDENT_AMBULATORY_CARE_PROVIDER_SITE_OTHER): Payer: Medicare Other | Admitting: Family Medicine

## 2013-03-11 ENCOUNTER — Encounter: Payer: Self-pay | Admitting: Family Medicine

## 2013-03-11 VITALS — BP 120/60 | Temp 97.6°F | Wt 195.0 lb

## 2013-03-11 DIAGNOSIS — G500 Trigeminal neuralgia: Secondary | ICD-10-CM

## 2013-03-11 NOTE — Patient Instructions (Addendum)
-  increase carbamazepine to 1.5 tablets (150mg ) twice daily and keep appointment with your ENT doctor  -can also take tylenol 500-1000mg  up to 2-3 times per day if needed  -follow up with me in September as scheduled
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
## 2013-03-12 DIAGNOSIS — R5381 Other malaise: Secondary | ICD-10-CM | POA: Diagnosis not present

## 2013-03-12 DIAGNOSIS — J029 Acute pharyngitis, unspecified: Secondary | ICD-10-CM | POA: Diagnosis not present

## 2013-03-12 DIAGNOSIS — I2510 Atherosclerotic heart disease of native coronary artery without angina pectoris: Secondary | ICD-10-CM | POA: Diagnosis not present

## 2013-03-12 DIAGNOSIS — R791 Abnormal coagulation profile: Secondary | ICD-10-CM | POA: Diagnosis not present

## 2013-03-12 DIAGNOSIS — R51 Headache: Secondary | ICD-10-CM | POA: Diagnosis not present

## 2013-03-12 DIAGNOSIS — B370 Candidal stomatitis: Secondary | ICD-10-CM | POA: Diagnosis not present

## 2013-03-12 DIAGNOSIS — Z8673 Personal history of transient ischemic attack (TIA), and cerebral infarction without residual deficits: Secondary | ICD-10-CM | POA: Diagnosis not present

## 2013-03-12 DIAGNOSIS — H9209 Otalgia, unspecified ear: Secondary | ICD-10-CM | POA: Diagnosis not present

## 2013-03-12 DIAGNOSIS — R5383 Other fatigue: Secondary | ICD-10-CM | POA: Diagnosis not present

## 2013-03-12 DIAGNOSIS — I10 Essential (primary) hypertension: Secondary | ICD-10-CM | POA: Diagnosis not present

## 2013-03-12 DIAGNOSIS — R4789 Other speech disturbances: Secondary | ICD-10-CM | POA: Diagnosis not present

## 2013-03-12 DIAGNOSIS — E119 Type 2 diabetes mellitus without complications: Secondary | ICD-10-CM | POA: Diagnosis not present

## 2013-03-17 DIAGNOSIS — B370 Candidal stomatitis: Secondary | ICD-10-CM | POA: Diagnosis not present

## 2013-03-17 DIAGNOSIS — J3801 Paralysis of vocal cords and larynx, unilateral: Secondary | ICD-10-CM | POA: Diagnosis not present

## 2013-03-24 ENCOUNTER — Ambulatory Visit (INDEPENDENT_AMBULATORY_CARE_PROVIDER_SITE_OTHER): Payer: Medicare Other | Admitting: Adult Health

## 2013-03-24 ENCOUNTER — Encounter: Payer: Self-pay | Admitting: Adult Health

## 2013-03-24 VITALS — BP 124/60 | HR 64 | Ht 60.0 in | Wt 197.0 lb

## 2013-03-24 DIAGNOSIS — I359 Nonrheumatic aortic valve disorder, unspecified: Secondary | ICD-10-CM

## 2013-03-24 DIAGNOSIS — I7090 Unspecified atherosclerosis: Secondary | ICD-10-CM | POA: Diagnosis not present

## 2013-03-24 DIAGNOSIS — I2510 Atherosclerotic heart disease of native coronary artery without angina pectoris: Secondary | ICD-10-CM

## 2013-03-24 DIAGNOSIS — I350 Nonrheumatic aortic (valve) stenosis: Secondary | ICD-10-CM

## 2013-03-24 DIAGNOSIS — I10 Essential (primary) hypertension: Secondary | ICD-10-CM | POA: Diagnosis not present

## 2013-03-24 NOTE — Assessment & Plan Note (Signed)
Echocardiogram one year ago demonstrated: Mildly to moderately calcified annulus. Mildly thickened, moderately calcified leaflets. Cusp separation was moderately reduced. There was mild to moderate stenosis. Trivial regurgitation. Valve area: 1.33cm^2(VTI). Valve area: 1.21cm^2 (Vmax).  Uncertain if symptoms of fatigue, DOE are related to worsening AoV. I will repeat echo. She will be seen by Dr. Wyline Mood on follow up for discussion of test results, and consideration for TAVI if she meets criteria.
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
## 2013-03-24 NOTE — Assessment & Plan Note (Signed)
Doubt fatigue is related to CAD at this time. She is deconditioned and sedentary. Continue with risk management.

## 2013-03-24 NOTE — Assessment & Plan Note (Signed)
Excellent control of BP at this time. Will continue current medications. Labs have been drawn by Dr.Kim PCP, one month ago,. Will request records.

## 2013-03-24 NOTE — Patient Instructions (Addendum)
Your physician recommends that you schedule a follow-up appointment in: 1 month  Your physician has requested that you have an echocardiogram. Echocardiography is a painless test that uses sound waves to create images of your heart. It provides your doctor with information about the size and shape of your heart and how well your heart's chambers and valves are working. This procedure takes approximately one hour. There are no restrictions for this procedure.    

## 2013-03-24 NOTE — Progress Notes (Deleted)
Name: [name]    DOB: Sep 18, 1927  Age: 79 y.o.  MR#: [id number]       PCP:  [name]., DO      Insurance: Payor: MEDICARE / Plan: MEDICARE PART A AND B / Product Type: *No Product type* /   CC:   No chief complaint on file.   VS Filed Vitals:   03/24/13 1306  BP: 124/60  Pulse: 64  Height: 5' (1.524 m)  Weight: 197 lb (89.359 kg)  SpO2: 98%    Weights Current Weight  03/24/13 197 lb (89.359 kg)  03/11/13 195 lb (88.451 kg)  01/02/13 204 lb (92.534 kg)    Blood Pressure  BP Readings from Last 3 Encounters:  03/24/13 124/60  03/11/13 120/60  01/02/13 138/70     Admit date:  (Not on file) Last encounter with RMR:  10/02/2012   Allergy Orencia  Current Outpatient Prescriptions  Medication Sig Dispense Refill  . acetaminophen (TYLENOL) 325 MG tablet Take 650 mg by mouth as needed.        Marland Kitchen amLODipine (NORVASC) 5 MG tablet Take 1 tablet (5 mg total) by mouth daily.  90 tablet  3  . aspirin 81 MG tablet Take 81 mg by mouth daily.      . carvedilol (COREG) 12.5 MG tablet Take 12.5 mg by mouth daily.      . clopidogrel (PLAVIX) 75 MG tablet Take 75 mg by mouth daily.        Marland Kitchen docusate sodium (COLACE) 100 MG capsule Take 100 mg by mouth 2 (two) times daily.        . fluticasone (FLONASE) 50 MCG/ACT nasal spray Place 2 sprays into the nose daily.  16 g  6  . glucose blood test strip Use as instructed  100 each  12  . losartan (COZAAR) 100 MG tablet Take 1 tablet (100 mg total) by mouth daily.  30 tablet  5  . MENTHOL-METHYL SALICYLATE EX Apply topically 3 (three) times daily as needed.      . Multiple Vitamin (MULTIVITAMIN) tablet Take 1 tablet by mouth daily.        . NONFORMULARY OR COMPOUNDED ITEM Patient needs a replacement socket.   Dx.: Right BKA  1 each  0  . nystatin (MYCOSTATIN) 100000 UNIT/ML suspension TAKE 5 ML 4 TIMES A DAY FOR 10 DAYS  120 mL  2  . pravastatin (PRAVACHOL) 40 MG tablet TAKE 1 TABLET BY MOUTH DAILY  90 tablet  3  . predniSONE  (DELTASONE) 10 MG tablet Take 5 mg by mouth daily.       . ranitidine (ZANTAC) 300 MG tablet Take 300 mg by mouth at bedtime.      . sitaGLIPtan-metformin (JANUMET) 50-1000 MG per tablet Take 1 tablet in the morning and 1 tablet in the evening.  180 tablet  1   No current facility-administered medications for this visit.    Discontinued Meds:    Medications Discontinued During This Encounter  Medication Reason  . carvedilol (COREG) 25 MG tablet Error    Patient Active Problem List   Diagnosis Date Noted  . Hx of right BKA 05/31/2012  . Aortic valve stenosis 04/08/2012  . Hypertension   . Arteriosclerotic cardiovascular disease (ASCVD)   . Cerebrovascular disease   . Peripheral vascular disease   . Degenerative joint disease   . Lymphoma   . Restless leg syndrome   . Anemia   . Syncope   . OBESITY 08/11/2010  .  DIABETES MELLITUS, TYPE II 06/06/2007  . Hyperlipidemia 06/06/2007  . OVERACTIVE BLADDER 06/06/2007    LABS    Component Value Date/Time   NA 140 01/02/2013 1113   NA 142 11/14/2012 1050   NA 139 09/05/2012 1059   K 3.7 01/02/2013 1113   K 4.5 11/14/2012 1050   K 4.6 09/05/2012 1059   CL 104 01/02/2013 1113   CL 102 11/14/2012 1050   CL 103 09/05/2012 1059   CO2 29 01/02/2013 1113   CO2 30 11/14/2012 1050   CO2 30 09/05/2012 1059   GLUCOSE 151* 01/02/2013 1113   GLUCOSE 137* 11/14/2012 1050   GLUCOSE 162* 09/05/2012 1059   BUN 22 01/02/2013 1113   BUN 17 11/14/2012 1050   BUN 20 09/05/2012 1059   CREATININE 0.9 01/02/2013 1113   CREATININE 1.0 11/14/2012 1050   CREATININE 0.9 09/05/2012 1059   CREATININE 0.94 01/31/2012 1305   CREATININE 0.80 12/07/2011 1145   CREATININE 0.77 10/26/2011 1336   CALCIUM 9.3 01/02/2013 1113   CALCIUM 9.6 11/14/2012 1050   CALCIUM 9.5 09/05/2012 1059   GFRNONAA >60 01/29/2011 2301   GFRNONAA >60 01/16/2011 0345   GFRNONAA >60 01/15/2011 1336   GFRAA >60 01/29/2011 2301   GFRAA >60 01/16/2011 0345   GFRAA >60 01/15/2011 1336   CMP     Component Value Date/Time    NA 140 01/02/2013 1113   K 3.7 01/02/2013 1113   CL 104 01/02/2013 1113   CO2 29 01/02/2013 1113   GLUCOSE 151* 01/02/2013 1113   BUN 22 01/02/2013 1113   CREATININE 0.9 01/02/2013 1113   CREATININE 0.94 01/31/2012 1305   CALCIUM 9.3 01/02/2013 1113   PROT 6.5 12/07/2011 1145   ALBUMIN 3.8 12/07/2011 1145   AST 17 12/07/2011 1145   ALT 8 12/07/2011 1145   ALKPHOS 65 12/07/2011 1145   BILITOT 0.2* 12/07/2011 1145   GFRNONAA >60 01/29/2011 2301   GFRAA >60 01/29/2011 2301       Component Value Date/Time   WBC 6.3 11/14/2012 1050   WBC 7.0 12/07/2011 1145   WBC 7.0 10/26/2011 1336   HGB 11.0* 11/14/2012 1050   HGB 10.7* 12/07/2011 1145   HGB 10.8* 10/26/2011 1336   HCT 34.5* 11/14/2012 1050   HCT 34.7* 12/07/2011 1145   HCT 35.7* 10/26/2011 1336   MCV 80.7 11/14/2012 1050   MCV 86.3 12/07/2011 1145   MCV 85.8 10/26/2011 1336    Lipid Panel     Component Value Date/Time   CHOL 137 09/05/2012 1059   TRIG 99.0 09/05/2012 1059   HDL 75.30 09/05/2012 1059   CHOLHDL 2 09/05/2012 1059   VLDL 19.8 09/05/2012 1059   LDLCALC 42 09/05/2012 1059    ABG    Component Value Date/Time   HCO3 31.4* 06/03/2007 1747   TCO2 30 10/14/2010 1622     Lab Results  Component Value Date   TSH 1.277 01/16/2011   BNP (last 3 results) No results found for this basename: PROBNP,  in the last 8760 hours Cardiac Panel (last 3 results) No results found for this basename: CKTOTAL, CKMB, TROPONINI, RELINDX,  in the last 72 hours  Iron/TIBC/Ferritin    Component Value Date/Time   IRON 18* 12/01/2010 1230   TIBC 226* 12/01/2010 1230   FERRITIN 138 12/01/2010 1230     EKG Orders placed in visit on 10/02/12  . EKG 12-LEAD     Prior Assessment and Plan Problem List as of 03/24/2013     Cardiovascular  and Mediastinum   Hypertension   Last Assessment & Plan   10/02/2012 Office Visit Written 10/02/2012  2:33 PM by Jodelle Gross, NP     Blood pressure slightly elevated on this visit. She has gained approximately 10 pounds since being seen one month ago.  She is not complaining of any dyspnea on exertion or fluid retention. She has not been very active. We will not make any medication adjustments at this time as this is a one-time noted minor elevation. However looking at trends since November 2013 her blood pressures been running 124/70 up to 142/70 on this visit. We took her off her HCTZ for couple months ago she was unable to tolerate it secondary to dizziness. He may need to consider starting her on an additional medication if she is unable to keep her pressure down on current medications. I advised her to take HCTZ when necessary for any evidence of fluid retention. This can be done up to twice a week only. We will see her again in 6 months unless she becomes symptomatic.    Arteriosclerotic cardiovascular disease (ASCVD)   Last Assessment & Plan   10/02/2012 Office Visit Written 10/02/2012  2:34 PM by Jodelle Gross, NP     She is without complaints of chest pain, dyspnea on exertion or pressure. We will not plan any cardiac testing at this time as she is asymptomatic. Will continue risk management. She will continue on aspirin 81 mg daily, Plavix, and statin. We will see her in 6 months unless he becomes symptomatic.    Cerebrovascular disease   Last Assessment & Plan   03/28/2012 Office Visit Written 03/28/2012  5:15 PM by Jodelle Gross, NP     Most recent carotid ultrasound was completed on 06/14/2007 which revealed port sugars carotid systems with minimal plaque formation. Elevated peak systolic velocity in ICA/CCA ratio seen at the right ICA the visualized plaque only. Mild in degree. The velocities obtained corresponds with 50-69% diameter stenosis of the right ICA, although this may be overestimated due to tortuosity of the carotid vessels. We will not repeat the Doppler study at this time. Doubt this is etiology of her dizziness. If it continues to be an issue with changes in antihypertensives, will pursue further evaluation with a repeat  Doppler study.     Peripheral vascular disease   Last Assessment & Plan   06/30/2011 Office Visit Written 06/30/2011  8:34 PM by Kathlen Brunswick, MD     The patient remains asymptomatic with respect to peripheral vascular disease and requires no further treatment or testing at the present time.    Syncope   Last Assessment & Plan   10/26/2011 Office Visit Written 10/26/2011  1:33 PM by Kathlen Brunswick, MD     Patient reports difficulty maintaining balance, but does well with a walker.  Previous "syncopal spells" may have represented falls.  Additional testing will be deferred until a repeat episode occurs.    Aortic valve stenosis   Last Assessment & Plan   10/02/2012 Office Visit Written 10/02/2012  2:35 PM by Jodelle Gross, NP     She continues to have systolic murmur. Blood pressure control is essential. Will increase amlodipine to 10 mg daily should this remain elevated. Not repeat echo until next office visit in 6 months.      Endocrine   DIABETES MELLITUS, TYPE II     Musculoskeletal and Integument   Degenerative joint disease     Genitourinary  OVERACTIVE BLADDER     Other   Hyperlipidemia   Last Assessment & Plan   06/30/2011 Office Visit Written 06/30/2011  8:29 PM by Kathlen Brunswick, MD     Excellent control of hyperlipidemia when last assessed more than one year ago.  Repeat laboratory studies will be obtained.    OBESITY   Last Assessment & Plan   10/02/2012 Office Visit Written 10/02/2012  2:36 PM by Jodelle Gross, NP     She is advised to increase her activity, she does have a below-the-knee amputation on the right. She is limited in her ambulation however I have advised her to do as much as she can and to watch her caloric intake.    Lymphoma   Restless leg syndrome   Anemia   Last Assessment & Plan   10/26/2011 Office Visit Edited 10/27/2011  3:24 PM by Kathlen Brunswick, MD     Patient has still not apparently been evaluated for anemia.  She reports a  negative screening colonoscopy 4 years ago.  Iron studies are suggestive of iron deficiency, but ferritin was normal.  Stool for Hemoccult testing will be obtained.  Dr. Chestine Spore may wish to refer to Hematology or GI for further evaluation.    Hx of right BKA       Imaging: No results found.

## 2013-03-25 ENCOUNTER — Other Ambulatory Visit: Payer: Self-pay | Admitting: Family Medicine

## 2013-03-25 NOTE — Telephone Encounter (Signed)
Should see ENT if worried about thrush. Can refill once.

## 2013-03-27 NOTE — Telephone Encounter (Signed)
Spoke with pt's daughter and she states pt just saw Dr. Delford Field and he told pt to continue nystatin.  Pt's daughter states that the prescription from the ER was misplaced.  Pt's daughter aware that 1 refill was sent to pharmacy.

## 2013-03-28 ENCOUNTER — Ambulatory Visit (HOSPITAL_COMMUNITY)
Admission: RE | Admit: 2013-03-28 | Discharge: 2013-03-28 | Disposition: A | Discharge status: Home / Self Care | Payer: Medicare Other | Source: Ambulatory Visit | Attending: Adult Health | Admitting: Adult Health

## 2013-03-28 ENCOUNTER — Ambulatory Visit: Payer: Medicare Other | Admitting: Adult Health

## 2013-03-28 DIAGNOSIS — I10 Essential (primary) hypertension: Secondary | ICD-10-CM | POA: Diagnosis not present

## 2013-03-28 DIAGNOSIS — I2510 Atherosclerotic heart disease of native coronary artery without angina pectoris: Secondary | ICD-10-CM

## 2013-03-28 DIAGNOSIS — E119 Type 2 diabetes mellitus without complications: Secondary | ICD-10-CM | POA: Insufficient documentation

## 2013-03-28 DIAGNOSIS — I350 Nonrheumatic aortic (valve) stenosis: Secondary | ICD-10-CM

## 2013-03-28 DIAGNOSIS — I369 Nonrheumatic tricuspid valve disorder, unspecified: Secondary | ICD-10-CM

## 2013-03-28 DIAGNOSIS — I359 Nonrheumatic aortic valve disorder, unspecified: Secondary | ICD-10-CM | POA: Diagnosis not present

## 2013-03-28 LAB — HM DIABETES EYE EXAM

## 2013-03-28 NOTE — Progress Notes (Signed)
*  PRELIMINARY RESULTS* Echocardiogram 2D Echocardiogram has been performed.  [name] 03/28/2013, 12:17 PM

## 2013-04-08 ENCOUNTER — Encounter: Payer: Self-pay | Admitting: Family Medicine

## 2013-04-08 ENCOUNTER — Ambulatory Visit (INDEPENDENT_AMBULATORY_CARE_PROVIDER_SITE_OTHER): Payer: Medicare Other | Admitting: Family Medicine

## 2013-04-08 VITALS — BP 142/72 | Temp 98.6°F | Wt 199.0 lb

## 2013-04-08 DIAGNOSIS — I798 Other disorders of arteries, arterioles and capillaries in diseases classified elsewhere: Secondary | ICD-10-CM

## 2013-04-08 DIAGNOSIS — I7090 Unspecified atherosclerosis: Secondary | ICD-10-CM

## 2013-04-08 DIAGNOSIS — I10 Essential (primary) hypertension: Secondary | ICD-10-CM | POA: Diagnosis not present

## 2013-04-08 DIAGNOSIS — E669 Obesity, unspecified: Secondary | ICD-10-CM | POA: Insufficient documentation

## 2013-04-08 DIAGNOSIS — E1351 Other specified diabetes mellitus with diabetic peripheral angiopathy without gangrene: Secondary | ICD-10-CM

## 2013-04-08 DIAGNOSIS — E785 Hyperlipidemia, unspecified: Secondary | ICD-10-CM

## 2013-04-08 DIAGNOSIS — Z23 Encounter for immunization: Secondary | ICD-10-CM

## 2013-04-08 DIAGNOSIS — Z0000 Encounter for general adult medical examination without abnormal findings: Secondary | ICD-10-CM

## 2013-04-08 DIAGNOSIS — E119 Type 2 diabetes mellitus without complications: Secondary | ICD-10-CM

## 2013-04-08 DIAGNOSIS — I2510 Atherosclerotic heart disease of native coronary artery without angina pectoris: Secondary | ICD-10-CM | POA: Diagnosis not present

## 2013-04-08 LAB — LIPID PANEL
Cholesterol: 162 mg/dL (ref 0–200)
LDL Cholesterol: 63 mg/dL (ref 0–99)
Total CHOL/HDL Ratio: 2
VLDL: 27.2 mg/dL (ref 0.0–40.0)

## 2013-04-08 LAB — BASIC METABOLIC PANEL
BUN: 18 mg/dL (ref 6–23)
GFR: 88.15 mL/min (ref >60.00)
Potassium: 4.4 mEq/L (ref 3.5–5.1)

## 2013-04-08 NOTE — Addendum Note (Signed)
Addended by: Azucena Freed on: 04/08/2013 10:25 AM   Modules accepted: Orders

## 2013-04-08 NOTE — Progress Notes (Signed)
Medicare Annual Preventive Care Visit  (initial annual wellness or annual wellness exam)  Acute or chronic issues addressed today:  DM/HLD/CAD/HTN: Lab Results  Component Value Date   HGBA1C 7.4* 01/02/2013  -advised to increase janumet at that time: 2 tabs in am and 1 at night -last eye exam 03/28/13 with Dr. Nelle Don -Home BS: usually fasting in low 100s, no hypoglycemia -lipids at goal for a long time -followed by cards whom she saw recently - notes reviewed -meds asa, norvasc, coreg, plavix, losartan, pravastatin, janumet  Trigeminal neuralgia: -followed by ent at [facility] -on carbamazapine for this  1.) Patient-completed health risk assessment  - completed and reviewed, see scanned documentation  2.) Review of Medical History: -PMH, PSH, Family History and current specialty and care providers reviewed and updated and listed below  - see chart and below  3.) Review of functional ability and level of safety:  Any difficulty hearing? NO  History of falling? NO  Any trouble with IADLs - using a phone, using transportation, grocery shopping, preparing meals, doing housework, doing laundry, taking medications and managing money? NO  Advance Directives? NO  See summary of recommendations in Patient Instructions below.  4.) Physical Exam Filed Vitals:   04/08/13 0919  BP: 142/72  Temp: 98.6 F (37 C)   Estimated body mass index is 38.86 kg/(m^2) as calculated from the following:   Height as of 03/24/13: 5' (1.524 m).   Weight as of this encounter: 199 lb (90.266 kg).  Mini Cog: 1. Patient instructed to listen carefully and repeat the following: Apple Watch    Penny  2. Clock drawing test was administered: NORMAL       3. Recall of three words: 3/3   Patient Score: NEG    See patient instructions for recommendations.  4)The following written screening schedule of preventive measures were reviewed with assessment and plan made per below, orders and patient  instructions:           Alcohol screening:N/A     Obesity Screening and counseling: done     STI screening: N/A     Tobacco Screening:N/A       Pneumococcal Vaccine ASSESSMENT/PLAN: completed      Screening mammograph (yearly if >40) ASSESSMENT/PLAN: completed      Screening Pap smear/pelvic exam (q2 years) ASSESSMENT/PLAN: aged out      Colorectal cancer screening (FOBT yearly or flex sig q4y or colonoscopy q10y or barium enema q4y) ASSESSMENT/PLAN: completed      Diabetes outpatient self-management training services ASSESSMENT/PLAN: completed      Screening for glaucoma(q1y if high risk - diabetes, FH, AA and > 50 or hispanic and > 65) ASSESSMENT/PLAN: done      Medical nutritional therapy for individuals with diabetes or renal disease ASSESSMENT/PLAN: offered, counseled      Cardiovascular screening blood tests (lipids q5y) ASSESSMENT/PLAN: completed, getting labs today      Diabetes screening tests ASSESSMENT/PLAN: monitoring diabetes   7.) Summary: -risk factors and conditions per above assessment were discussed and treatment, recommendations and referrals were offered per documentation above and orders and patient instructions.

## 2013-04-08 NOTE — Patient Instructions (Addendum)
-  We have ordered labs or studies at this visit - your diabetes lab. It can take up to 1-2 weeks for results and processing. We will contact you with instructions IF your results are abnormal. Normal results will be released to your [facility]. If you have not heard from [place] or can not find your results in [facility] in 2 weeks please contact our office.  -we gave you your flu and tdap vaccines today  Please see a lawyer and/or go to this website to help you with advanced directives and designating a health care power of attorney so that your wishes will be followed should you become too ill to make your own medical decisions.  [email or web address]          Alcohol screening:N/A     Obesity Screening and counseling: done     STI screening: N/A     Tobacco Screening:N/A       Pneumococcal Vaccine ASSESSMENT/PLAN: completed      Screening mammograph (yearly if >40) ASSESSMENT/PLAN: completed      Screening Pap smear/pelvic exam (q2 years) ASSESSMENT/PLAN: aged out      Colorectal cancer screening (FOBT yearly or flex sig q4y or colonoscopy q10y or barium enema q4y) ASSESSMENT/PLAN: completed      Diabetes outpatient self-management training services ASSESSMENT/PLAN: completed      Screening for glaucoma(q1y if high risk - diabetes, FH, AA and > 50 or hispanic and > 65) ASSESSMENT/PLAN: done      Medical nutritional therapy for individuals with diabetes or renal disease ASSESSMENT/PLAN: offered, counseled      Cardiovascular screening blood tests (lipids q5y) ASSESSMENT/PLAN: completed, getting labs today      Diabetes screening tests

## 2013-04-09 NOTE — Progress Notes (Signed)
Quick Note:  Called and spoke with pt's daughter. She is aware of lab results. ______

## 2013-04-12 ENCOUNTER — Other Ambulatory Visit: Payer: Self-pay | Admitting: Family Medicine

## 2013-04-24 DIAGNOSIS — L851 Acquired keratosis [keratoderma] palmaris et plantaris: Secondary | ICD-10-CM | POA: Diagnosis not present

## 2013-04-24 DIAGNOSIS — B351 Tinea unguium: Secondary | ICD-10-CM | POA: Diagnosis not present

## 2013-04-24 DIAGNOSIS — E1159 Type 2 diabetes mellitus with other circulatory complications: Secondary | ICD-10-CM | POA: Diagnosis not present

## 2013-04-28 ENCOUNTER — Other Ambulatory Visit: Payer: Self-pay | Admitting: Family Medicine

## 2013-04-28 ENCOUNTER — Ambulatory Visit: Payer: Medicare Other | Admitting: Cardiology

## 2013-04-28 NOTE — Telephone Encounter (Signed)
I thinks she is seeing ENT for this? Also, I thought the dose was 150mg  bid? Can refill for 1 month but advise to follow up with eNT at [facility].

## 2013-04-29 NOTE — Telephone Encounter (Signed)
Left a message for return call.  

## 2013-05-01 NOTE — Telephone Encounter (Signed)
Spoke with pt's daughter and she states that pt has never gotten this medication prescribed by ENT but pt will go to see them next week and she will call and follow up with me to let me know if they are going to prescribe it. Rx sent in for 1 month.

## 2013-05-07 ENCOUNTER — Ambulatory Visit: Payer: Medicare Other | Admitting: Cardiology

## 2013-05-07 DIAGNOSIS — J3800 Paralysis of vocal cords and larynx, unspecified: Secondary | ICD-10-CM | POA: Diagnosis not present

## 2013-05-07 DIAGNOSIS — J384 Edema of larynx: Secondary | ICD-10-CM | POA: Diagnosis not present

## 2013-05-09 ENCOUNTER — Encounter: Payer: Self-pay | Admitting: *Deleted

## 2013-05-09 ENCOUNTER — Ambulatory Visit (INDEPENDENT_AMBULATORY_CARE_PROVIDER_SITE_OTHER): Payer: Medicare Other | Admitting: Cardiology

## 2013-05-09 ENCOUNTER — Encounter: Payer: Self-pay | Admitting: Cardiology

## 2013-05-09 VITALS — BP 126/56 | HR 65 | Ht 60.0 in | Wt 168.1 lb

## 2013-05-09 DIAGNOSIS — R0600 Dyspnea, unspecified: Secondary | ICD-10-CM

## 2013-05-09 DIAGNOSIS — R0609 Other forms of dyspnea: Secondary | ICD-10-CM | POA: Diagnosis not present

## 2013-05-09 DIAGNOSIS — R079 Chest pain, unspecified: Secondary | ICD-10-CM
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
## 2013-05-09 NOTE — Patient Instructions (Signed)
Your physician recommends that you schedule a follow-up appointment in: 3 weeks with Dr Wyline Mood  Your physician has requested that you have a lexiscan myoview. For further information please visit [email or web address] Please follow instruction sheet, as given.

## 2013-05-15 ENCOUNTER — Encounter: Payer: Self-pay | Admitting: Family Medicine

## 2013-05-15 NOTE — Patient Instructions (Signed)
Recevieved office notes from [facility] from 05/07/13.  Transnasal Flexible Laryngoscopy with Videostroboscopy Y/N n) performed.  Sent to Dr. Selena Batten to sign and then send to scan box.

## 2013-05-21 ENCOUNTER — Encounter (HOSPITAL_COMMUNITY)
Admission: RE | Admit: 2013-05-21 | Discharge: 2013-05-21 | Disposition: A | Discharge status: Home / Self Care | Payer: Medicare Other | Source: Ambulatory Visit | Attending: Cardiology | Admitting: Cardiology

## 2013-05-21 ENCOUNTER — Encounter (HOSPITAL_COMMUNITY): Payer: Self-pay

## 2013-05-21 DIAGNOSIS — I10 Essential (primary) hypertension: Secondary | ICD-10-CM | POA: Diagnosis not present

## 2013-05-21 DIAGNOSIS — R079 Chest pain, unspecified: Secondary | ICD-10-CM

## 2013-05-21 DIAGNOSIS — I359 Nonrheumatic aortic valve disorder, unspecified: Secondary | ICD-10-CM | POA: Insufficient documentation

## 2013-05-21 DIAGNOSIS — I2510 Atherosclerotic heart disease of native coronary artery without angina pectoris: Secondary | ICD-10-CM | POA: Insufficient documentation

## 2013-05-21 HISTORY — DX: Systemic involvement of connective tissue, unspecified: M35.9

## 2013-05-21 HISTORY — DX: Heart failure, unspecified: I50.9

## 2013-05-21 MED ORDER — SODIUM CHLORIDE 0.9 % IJ SOLN
INTRAMUSCULAR | Status: AC
  Administered 2013-05-21: 10 mL via INTRAVENOUS
  Filled 2013-05-21: qty 10

## 2013-05-21 MED ORDER — REGADENOSON 0.4 MG/5ML IV SOLN
INTRAVENOUS | Status: AC
  Administered 2013-05-21: 0.4 mg via INTRAVENOUS
  Filled 2013-05-21: qty 5

## 2013-05-21 MED ORDER — TECHNETIUM TC 99M SESTAMIBI - CARDIOLITE
30.0000 | Freq: Once | INTRAVENOUS | Status: AC | PRN
  Administered 2013-05-21: 11:00:00 30 via INTRAVENOUS

## 2013-05-21 MED ORDER — TECHNETIUM TC 99M SESTAMIBI - CARDIOLITE
10.0000 | Freq: Once | INTRAVENOUS | Status: AC | PRN
  Administered 2013-05-21: 09:00:00 10 via INTRAVENOUS

## 2013-05-21 NOTE — Progress Notes (Signed)
Stress Lab Nurses Notes - [facility]  SAMAURI KELLENBERGER 05/21/2013 Reason for doing test: Chest Pain and Dyspnea Type of test: Marlane Hatcher Nurse performing test: Parke Poisson, RN Nuclear Medicine Tech: Lyndel Pleasure Echo Tech: Not Applicable MD performing test: Inis Sizer NP Family MD: Kriste Basque Test explained and consent signed: yes IV started: 22g jelco, Saline lock flushed, No redness or edema and Saline lock started in radiology Symptoms: Chest pressure Treatment/Intervention: None Reason test stopped: protocol completed After recovery IV was: Discontinued via X-ray tech and No redness or edema Patient to return to Nuc. Med at : 11:15 Patient discharged: Home Patient's Condition upon discharge was: stable Comments: During test BP 140/58 & HR 90.  Recovery BP 158/67 & HR 76.  Symptoms resolved in recovery. Erskine Speed T

## 2013-05-26 ENCOUNTER — Other Ambulatory Visit: Payer: Self-pay | Admitting: Cardiology

## 2013-05-26 ENCOUNTER — Telehealth: Payer: Self-pay | Admitting: Family Medicine

## 2013-05-26 NOTE — Telephone Encounter (Signed)
Pt request refill of carbamazepine (TEGRETOL) 100 MG chewable tablet 1/2 tab 3 x a day Daughter states Dr Delford Field will not be filing this rx, that DR Selena Batten should fil Pharm: [facility] [address]

## 2013-05-27 MED ORDER — CARBAMAZEPINE 100 MG PO CHEW: CHEWABLE_TABLET | ORAL | Status: DC

## 2013-05-27 NOTE — Telephone Encounter (Signed)
Ok to refill current dose if symptoms stable. Should see prescribing specialist if symptoms escalate.

## 2013-05-27 NOTE — Addendum Note (Signed)
Addended by: Azucena Freed on: 05/27/2013 03:52 PM   Modules accepted: Orders

## 2013-05-27 NOTE — Telephone Encounter (Signed)
Called and spoke with pt and pt is aware of Dr. Kim's recommendations.  

## 2013-05-27 NOTE — Telephone Encounter (Signed)
Rx sent to pharmacy   

## 2013-06-03 ENCOUNTER — Ambulatory Visit (INDEPENDENT_AMBULATORY_CARE_PROVIDER_SITE_OTHER): Payer: Medicare Other | Admitting: Cardiology

## 2013-06-03 ENCOUNTER — Encounter: Payer: Self-pay | Admitting: Cardiology

## 2013-06-03 VITALS — BP 130/62 | HR 70 | Ht 60.0 in | Wt 182.0 lb

## 2013-06-03 DIAGNOSIS — I2510 Atherosclerotic heart disease of native coronary artery without angina pectoris: Secondary | ICD-10-CM | POA: Diagnosis not present

## 2013-06-03 DIAGNOSIS — R0609 Other forms of dyspnea: Secondary | ICD-10-CM | POA: Diagnosis not present

## 2013-06-03 DIAGNOSIS — I10 Essential (primary) hypertension: Secondary | ICD-10-CM

## 2013-06-03 DIAGNOSIS — R0600 Dyspnea, unspecified: Secondary | ICD-10-CM

## 2013-06-03 NOTE — Patient Instructions (Addendum)
Your physician recommends that you schedule a follow-up appointment in: 6 months  Your physician recommends that you continue on your current medications as directed. Please refer to the Current Medication list given to you today.  
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
## 2013-06-09 DIAGNOSIS — G500 Trigeminal neuralgia: Secondary | ICD-10-CM | POA: Diagnosis not present

## 2013-06-09 DIAGNOSIS — R51 Headache: Secondary | ICD-10-CM | POA: Diagnosis not present

## 2013-06-18 ENCOUNTER — Emergency Department (HOSPITAL_COMMUNITY)
Admission: EM | Admit: 2013-06-18 | Discharge: 2013-06-18 | Disposition: A | Discharge status: Home / Self Care | Payer: Medicare Other | Attending: Emergency Medicine | Admitting: Emergency Medicine

## 2013-06-18 ENCOUNTER — Emergency Department (HOSPITAL_COMMUNITY): Payer: Medicare Other

## 2013-06-18 ENCOUNTER — Encounter (HOSPITAL_COMMUNITY): Payer: Self-pay | Admitting: Emergency Medicine

## 2013-06-18 DIAGNOSIS — Z79899 Other long term (current) drug therapy: Secondary | ICD-10-CM | POA: Diagnosis not present

## 2013-06-18 DIAGNOSIS — Z862 Personal history of diseases of the blood and blood-forming organs and certain disorders involving the immune mechanism: Secondary | ICD-10-CM | POA: Insufficient documentation

## 2013-06-18 DIAGNOSIS — M1990 Unspecified osteoarthritis, unspecified site: Secondary | ICD-10-CM | POA: Diagnosis not present

## 2013-06-18 DIAGNOSIS — R059 Cough, unspecified: Secondary | ICD-10-CM | POA: Diagnosis not present

## 2013-06-18 DIAGNOSIS — Z7982 Long term (current) use of aspirin: Secondary | ICD-10-CM | POA: Diagnosis not present

## 2013-06-18 DIAGNOSIS — Z8669 Personal history of other diseases of the nervous system and sense organs: Secondary | ICD-10-CM | POA: Insufficient documentation

## 2013-06-18 DIAGNOSIS — E785 Hyperlipidemia, unspecified: Secondary | ICD-10-CM | POA: Insufficient documentation

## 2013-06-18 DIAGNOSIS — R55 Syncope and collapse: Secondary | ICD-10-CM | POA: Insufficient documentation

## 2013-06-18 DIAGNOSIS — E669 Obesity, unspecified: Secondary | ICD-10-CM | POA: Diagnosis not present

## 2013-06-18 DIAGNOSIS — E119 Type 2 diabetes mellitus without complications: Secondary | ICD-10-CM | POA: Insufficient documentation

## 2013-06-18 DIAGNOSIS — I509 Heart failure, unspecified: Secondary | ICD-10-CM | POA: Diagnosis not present

## 2013-06-18 DIAGNOSIS — J392 Other diseases of pharynx: Secondary | ICD-10-CM | POA: Insufficient documentation

## 2013-06-18 DIAGNOSIS — Z87898 Personal history of other specified conditions: Secondary | ICD-10-CM | POA: Insufficient documentation

## 2013-06-18 DIAGNOSIS — I10 Essential (primary) hypertension: Secondary | ICD-10-CM | POA: Diagnosis not present

## 2013-06-18 DIAGNOSIS — Z87448 Personal history of other diseases of urinary system: Secondary | ICD-10-CM | POA: Insufficient documentation

## 2013-06-18 DIAGNOSIS — Z7902 Long term (current) use of antithrombotics/antiplatelets: Secondary | ICD-10-CM | POA: Insufficient documentation

## 2013-06-18 DIAGNOSIS — R4182 Altered mental status, unspecified: Secondary | ICD-10-CM | POA: Diagnosis not present

## 2013-06-18 DIAGNOSIS — Z8619 Personal history of other infectious and parasitic diseases: Secondary | ICD-10-CM | POA: Insufficient documentation

## 2013-06-18 DIAGNOSIS — IMO0002 Reserved for concepts with insufficient information to code with codable children: Secondary | ICD-10-CM | POA: Diagnosis not present

## 2013-06-18 DIAGNOSIS — R05 Cough: Secondary | ICD-10-CM | POA: Insufficient documentation

## 2013-06-18 DIAGNOSIS — I69998 Other sequelae following unspecified cerebrovascular disease: Secondary | ICD-10-CM | POA: Diagnosis not present

## 2013-06-18 DIAGNOSIS — R350 Frequency of micturition: Secondary | ICD-10-CM | POA: Insufficient documentation

## 2013-06-18 DIAGNOSIS — G9389 Other specified disorders of brain: Secondary | ICD-10-CM | POA: Diagnosis not present

## 2013-06-18 DIAGNOSIS — Z87828 Personal history of other (healed) physical injury and trauma: Secondary | ICD-10-CM | POA: Diagnosis not present

## 2013-06-18 LAB — BASIC METABOLIC PANEL
CO2: 29 mEq/L (ref 19–32)
Calcium: 10.2 mg/dL (ref 8.4–10.5)
Creatinine, Ser: 0.72 mg/dL (ref 0.50–1.10)
GFR calc non Af Amer: 78 mL/min — ABNORMAL LOW (ref >90)
Sodium: 140 mEq/L (ref 135–145)

## 2013-06-18 LAB — CBC WITH DIFFERENTIAL/PLATELET
Basophils Absolute: 0 10*3/uL (ref 0.0–0.1)
Basophils Relative: 0 % (ref 0–1)
Eosinophils Absolute: 0.1 10*3/uL (ref 0.0–0.7)
Eosinophils Relative: 1 % (ref 0–5)
HCT: 36.3 % (ref 36.0–46.0)
Lymphocytes Relative: 12 % (ref 12–46)
MCH: 27 pg (ref 26.0–34.0)
MCHC: 31.1 g/dL (ref 30.0–36.0)
MCV: 86.8 fL (ref 78.0–100.0)
Monocytes Absolute: 0.3 10*3/uL (ref 0.1–1.0)
Platelets: 195 10*3/uL (ref 150–400)
RDW: 14.1 % (ref 11.5–15.5)
WBC: 7.1 10*3/uL (ref 4.0–10.5)

## 2013-06-18 LAB — URINALYSIS, ROUTINE W REFLEX MICROSCOPIC
Glucose, UA: NEGATIVE mg/dL
Ketones, ur: NEGATIVE mg/dL
Leukocytes, UA: NEGATIVE
Protein, ur: NEGATIVE mg/dL
Urobilinogen, UA: 0.2 mg/dL (ref 0.0–1.0)
pH: 5.5 (ref 5.0–8.0)

## 2013-06-18 LAB — TROPONIN I: Troponin I: <0.3 ng/mL (ref <0.30)

## 2013-06-18 LAB — D-DIMER, QUANTITATIVE: D-Dimer, Quant: 0.45 ug/mL-FEU (ref 0.00–0.48)

## 2013-06-18 MED ORDER — LEVETIRACETAM 250 MG PO TABS: 250.0000 mg | ORAL_TABLET | Freq: Every day | ORAL | Status: DC

## 2013-06-18 NOTE — ED Notes (Signed)
Ambulated pt in hallway; pt was able to walk with a walker;no complaints

## 2013-06-18 NOTE — ED Notes (Signed)
Patient states she was eating with her husband at a restaurant when she felt "sick" and passed out. Alert/oriented x 4. No distress. Speech clear.

## 2013-06-18 NOTE — ED Notes (Signed)
Pt states she was eating lunch and had a syncopal episode. Denies any symptoms prior to syncope.
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
## 2013-06-19 ENCOUNTER — Telehealth: Payer: Self-pay | Admitting: Family Medicine

## 2013-06-19 LAB — URINE CULTURE

## 2013-06-19 NOTE — Telephone Encounter (Signed)
Given this is a chronic issue we probably can weight until after neurology sees her - but if concerned they can reschedule the appt for sooner.

## 2013-06-19 NOTE — Telephone Encounter (Signed)
Patient Information:  Caller Name: Cornelia  Phone: [phone]  Patient: [name], [name]  Gender: Female  DOB: [birth year]  Age: 79 Years  PCP: Selena Batten (TEXT 1st, after 20 mins can call), [facility])  Office Follow Up:  Does the office need to follow up with this patient?: Yes  Instructions For The Office: Has appointment with Dr. Selena Batten on 07/08/13, which is over 2 weeks away.  She has appointment with  Neurology on 06/25/13 related to her recent fainting episode.  .Guideline is to have her seen within 2 weeks due to chronic weakness/ fatigue.  Please advise caller.   Symptoms  Reason For Call & Symptoms: Daughter calling about Murlene.  Relates she was seen in ED for fainting episode on 06/18/13; diagnosed with possible seizure.  She was instructed to follow up with Neurology on 06/25/13.  She wants PCP to know about this.  She also relates concerns about her mother's poor appetite.  (for several weeks).  Caller estimates weight loss from 200 to < 180 in 3-4 months.  FSBG 110 this am.  Emergent symptoms ruled out.  See Within 2 Weeks in Office per Weakness ad Fatigue protocol due to Weakness is a chroinc symptom.    Has appointment for 07/08/13 with PCP.  Reviewed Health History In EMR: Yes  Reviewed Medications In EMR: Yes  Reviewed Allergies In EMR: Yes  Reviewed Surgeries / Procedures: Yes  Date of Onset of Symptoms: Unknown  Treatments Tried: Encouraged intake, offered variety of foods.  Treatments Tried Worked: No  Guideline(s) Used:  No Protocol Available - Sick Adult  Weakness (Generalized) and Fatigue  Disposition Per Guideline:   See Within 2 Weeks in Office  Reason For Disposition Reached:   Weakness is a chronic symptom (recurrent or ongoing AND lasting > 4 weeks)  Advice Given:  Reassurance  Not drinking enough fluids and being a little dehydrated is a common cause of mild weakness.  Here is some care advice that should help.  Fluids  : Drink  several glasses of fruit juice, other clear fluids, or water. This will improve hydration and blood glucose.  Rest  : Lie down with feet elevated for 1 hour. This will improve blood flow and increase blood flow to the brain.  Call Back If:   Passes out (faints)  You become worse.  Patient Will Follow Care Advice:  YES

## 2013-06-20 NOTE — Telephone Encounter (Signed)
Called and spoke with pt and pt is aware.  Pt states she is feeling better today. Pt aware to call back if sooner appt is needed.

## 2013-06-25 DIAGNOSIS — G459 Transient cerebral ischemic attack, unspecified: Secondary | ICD-10-CM | POA: Diagnosis not present

## 2013-06-25 DIAGNOSIS — R55 Syncope and collapse: Secondary | ICD-10-CM | POA: Diagnosis not present

## 2013-06-26 ENCOUNTER — Observation Stay (HOSPITAL_COMMUNITY)
Admission: EM | Admit: 2013-06-26 | Discharge: 2013-06-27 | Disposition: A | Discharge status: Home / Self Care | Payer: Medicare Other | Attending: Internal Medicine | Admitting: Internal Medicine

## 2013-06-26 ENCOUNTER — Encounter (HOSPITAL_COMMUNITY): Payer: Self-pay | Admitting: Emergency Medicine

## 2013-06-26 ENCOUNTER — Emergency Department (HOSPITAL_COMMUNITY): Payer: Medicare Other

## 2013-06-26 DIAGNOSIS — D649 Anemia, unspecified: Secondary | ICD-10-CM | POA: Diagnosis not present

## 2013-06-26 DIAGNOSIS — I10 Essential (primary) hypertension: Secondary | ICD-10-CM | POA: Diagnosis not present

## 2013-06-26 DIAGNOSIS — Z955 Presence of coronary angioplasty implant and graft: Secondary | ICD-10-CM

## 2013-06-26 DIAGNOSIS — R0602 Shortness of breath: Secondary | ICD-10-CM | POA: Diagnosis not present

## 2013-06-26 DIAGNOSIS — E669 Obesity, unspecified: Secondary | ICD-10-CM | POA: Diagnosis not present

## 2013-06-26 DIAGNOSIS — R404 Transient alteration of awareness: Secondary | ICD-10-CM | POA: Diagnosis not present

## 2013-06-26 DIAGNOSIS — E119 Type 2 diabetes mellitus without complications: Secondary | ICD-10-CM | POA: Diagnosis not present

## 2013-06-26 DIAGNOSIS — E785 Hyperlipidemia, unspecified: Secondary | ICD-10-CM

## 2013-06-26 DIAGNOSIS — I2510 Atherosclerotic heart disease of native coronary artery without angina pectoris: Secondary | ICD-10-CM

## 2013-06-26 DIAGNOSIS — I350 Nonrheumatic aortic (valve) stenosis: Secondary | ICD-10-CM

## 2013-06-26 DIAGNOSIS — I679 Cerebrovascular disease, unspecified: Secondary | ICD-10-CM

## 2013-06-26 DIAGNOSIS — R55 Syncope and collapse: Principal | ICD-10-CM

## 2013-06-26 DIAGNOSIS — I359 Nonrheumatic aortic valve disorder, unspecified: Secondary | ICD-10-CM | POA: Diagnosis not present

## 2013-06-26 DIAGNOSIS — M1990 Unspecified osteoarthritis, unspecified site: Secondary | ICD-10-CM | POA: Diagnosis present

## 2013-06-26 DIAGNOSIS — I739 Peripheral vascular disease, unspecified: Secondary | ICD-10-CM

## 2013-06-26 DIAGNOSIS — D638 Anemia in other chronic diseases classified elsewhere: Secondary | ICD-10-CM | POA: Diagnosis present

## 2013-06-26 DIAGNOSIS — G500 Trigeminal neuralgia: Secondary | ICD-10-CM

## 2013-06-26 LAB — COMPREHENSIVE METABOLIC PANEL
ALT: 12 U/L (ref 0–35)
Calcium: 9.9 mg/dL (ref 8.4–10.5)
Creatinine, Ser: 0.83 mg/dL (ref 0.50–1.10)
GFR calc Af Amer: 74 mL/min — ABNORMAL LOW (ref >90)
Glucose, Bld: 178 mg/dL — ABNORMAL HIGH (ref 70–99)
Sodium: 138 mEq/L (ref 135–145)
Total Protein: 7.1 g/dL (ref 6.0–8.3)

## 2013-06-26 LAB — CBC WITH DIFFERENTIAL/PLATELET
Basophils Absolute: 0 10*3/uL (ref 0.0–0.1)
Basophils Relative: 0 % (ref 0–1)
HCT: 34 % — ABNORMAL LOW (ref 36.0–46.0)
Hemoglobin: 10.9 g/dL — ABNORMAL LOW (ref 12.0–15.0)
Lymphocytes Relative: 6 % — ABNORMAL LOW (ref 12–46)
MCHC: 32.1 g/dL (ref 30.0–36.0)
MCV: 86.3 fL (ref 78.0–100.0)
Neutro Abs: 10.1 10*3/uL — ABNORMAL HIGH (ref 1.7–7.7)
Neutrophils Relative %: 89 % — ABNORMAL HIGH (ref 43–77)
RDW: 14 % (ref 11.5–15.5)
WBC: 11.4 10*3/uL — ABNORMAL HIGH (ref 4.0–10.5)

## 2013-06-26 MED ORDER — METFORMIN HCL 500 MG PO TABS
1000.0000 mg | ORAL_TABLET | Freq: Two times a day (BID) | ORAL | Status: DC
  Administered 2013-06-27: 1000 mg via ORAL
  Filled 2013-06-26: qty 2

## 2013-06-26 MED ORDER — CLOPIDOGREL BISULFATE 75 MG PO TABS
75.0000 mg | ORAL_TABLET | Freq: Every day | ORAL | Status: DC
  Administered 2013-06-27: 75 mg via ORAL
  Filled 2013-06-26: qty 1

## 2013-06-26 MED ORDER — CARBAMAZEPINE ER 100 MG PO TB12
100.0000 mg | ORAL_TABLET | Freq: Two times a day (BID) | ORAL | Status: DC
  Administered 2013-06-27 (×2): 100 mg via ORAL
  Filled 2013-06-26 (×8): qty 1

## 2013-06-26 MED ORDER — ONDANSETRON HCL 4 MG/2ML IJ SOLN: 4.0000 mg | Freq: Four times a day (QID) | INTRAMUSCULAR | Status: DC | PRN

## 2013-06-26 MED ORDER — FAMOTIDINE 20 MG PO TABS
20.0000 mg | ORAL_TABLET | Freq: Two times a day (BID) | ORAL | Status: DC
  Administered 2013-06-27 (×2): 20 mg via ORAL
  Filled 2013-06-26 (×2): qty 1

## 2013-06-26 MED ORDER — DOCUSATE SODIUM 100 MG PO CAPS
100.0000 mg | ORAL_CAPSULE | Freq: Two times a day (BID) | ORAL | Status: DC
  Administered 2013-06-27 (×2): 100 mg via ORAL
  Filled 2013-06-26 (×2): qty 1

## 2013-06-26 MED ORDER — AMLODIPINE BESYLATE 5 MG PO TABS
10.0000 mg | ORAL_TABLET | Freq: Every day | ORAL | Status: DC
  Administered 2013-06-27: 10 mg via ORAL
  Filled 2013-06-26: qty 2

## 2013-06-26 MED ORDER — FLEET ENEMA 7-19 GM/118ML RE ENEM: 1.0000 | ENEMA | Freq: Once | RECTAL | Status: AC | PRN

## 2013-06-26 MED ORDER — NYSTATIN 100000 UNIT/ML MT SUSP
5.0000 mL | Freq: Four times a day (QID) | OROMUCOSAL | Status: DC
  Administered 2013-06-27 (×2): 500000 [IU] via ORAL
  Filled 2013-06-26 (×2): qty 5

## 2013-06-26 MED ORDER — SIMVASTATIN 20 MG PO TABS: 20.0000 mg | ORAL_TABLET | Freq: Every day | ORAL | Status: DC

## 2013-06-26 MED ORDER — ACETAMINOPHEN 325 MG PO TABS: 650.0000 mg | ORAL_TABLET | Freq: Four times a day (QID) | ORAL | Status: DC | PRN

## 2013-06-26 MED ORDER — CARVEDILOL 12.5 MG PO TABS
25.0000 mg | ORAL_TABLET | Freq: Every day | ORAL | Status: DC
  Administered 2013-06-27: 25 mg via ORAL
  Filled 2013-06-26 (×4): qty 2

## 2013-06-26 MED ORDER — LOSARTAN POTASSIUM 50 MG PO TABS
100.0000 mg | ORAL_TABLET | Freq: Every day | ORAL | Status: DC
  Administered 2013-06-27: 100 mg via ORAL
  Filled 2013-06-26: qty 2

## 2013-06-26 MED ORDER — PREDNISONE 10 MG PO TABS
5.0000 mg | ORAL_TABLET | Freq: Every day | ORAL | Status: DC
  Administered 2013-06-27: 5 mg via ORAL
  Filled 2013-06-26: qty 1

## 2013-06-26 MED ORDER — ENOXAPARIN SODIUM 30 MG/0.3ML ~~LOC~~ SOLN
30.0000 mg | SUBCUTANEOUS | Status: DC
  Administered 2013-06-27: 30 mg via SUBCUTANEOUS
  Filled 2013-06-26: qty 0.3

## 2013-06-26 MED ORDER — LINAGLIPTIN 5 MG PO TABS
2.5000 mg | ORAL_TABLET | Freq: Two times a day (BID) | ORAL | Status: DC
  Administered 2013-06-27: 2.5 mg via ORAL
  Filled 2013-06-26: qty 1

## 2013-06-26 MED ORDER — ASPIRIN EC 81 MG PO TBEC
81.0000 mg | DELAYED_RELEASE_TABLET | Freq: Every day | ORAL | Status: DC
  Administered 2013-06-27: 81 mg via ORAL
  Filled 2013-06-26: qty 1

## 2013-06-26 MED ORDER — SITAGLIPTIN PHOS-METFORMIN HCL 50-1000 MG PO TABS: 1.0000 | ORAL_TABLET | Freq: Two times a day (BID) | ORAL | Status: DC

## 2013-06-26 MED ORDER — SODIUM CHLORIDE 0.9 % IJ SOLN
3.0000 mL | Freq: Two times a day (BID) | INTRAMUSCULAR | Status: DC
  Administered 2013-06-27: 3 mL via INTRAVENOUS

## 2013-06-26 MED ORDER — POTASSIUM CHLORIDE IN NACL 20-0.9 MEQ/L-% IV SOLN
INTRAVENOUS | Status: DC
  Administered 2013-06-27: 02:00:00 via INTRAVENOUS

## 2013-06-26 MED ORDER — INSULIN ASPART 100 UNIT/ML ~~LOC~~ SOLN: 0.0000 [IU] | Freq: Three times a day (TID) | SUBCUTANEOUS | Status: DC

## 2013-06-26 MED ORDER — INSULIN ASPART 100 UNIT/ML ~~LOC~~ SOLN: 0.0000 [IU] | Freq: Every day | SUBCUTANEOUS | Status: DC
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
## 2013-06-26 NOTE — ED Notes (Signed)
Seen here last week for syncopal episode.  Had another syncopal episode today.  cbg 135.  Vss.  Has an mri scheduled for dec 26th.
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
## 2013-06-26 NOTE — ED Notes (Signed)
Pt also states she has been sob today.   Denies any pain

## 2013-06-26 NOTE — ED Notes (Signed)
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  Pt states she is light headed with sitting and states this is how it normally starts.  Did not do standing orthostatics.

## 2013-06-27 DIAGNOSIS — E785 Hyperlipidemia, unspecified: Secondary | ICD-10-CM

## 2013-06-27 DIAGNOSIS — Z9861 Coronary angioplasty status: Secondary | ICD-10-CM

## 2013-06-27 DIAGNOSIS — D649 Anemia, unspecified: Secondary | ICD-10-CM | POA: Diagnosis not present

## 2013-06-27 DIAGNOSIS — I7090 Unspecified atherosclerosis: Secondary | ICD-10-CM

## 2013-06-27 DIAGNOSIS — I2510 Atherosclerotic heart disease of native coronary artery without angina pectoris: Secondary | ICD-10-CM | POA: Diagnosis not present

## 2013-06-27 DIAGNOSIS — I10 Essential (primary) hypertension: Secondary | ICD-10-CM

## 2013-06-27 DIAGNOSIS — I359 Nonrheumatic aortic valve disorder, unspecified: Secondary | ICD-10-CM | POA: Diagnosis not present

## 2013-06-27 DIAGNOSIS — I679 Cerebrovascular disease, unspecified: Secondary | ICD-10-CM

## 2013-06-27 DIAGNOSIS — R55 Syncope and collapse: Secondary | ICD-10-CM | POA: Diagnosis not present

## 2013-06-27 DIAGNOSIS — I739 Peripheral vascular disease, unspecified: Secondary | ICD-10-CM

## 2013-06-27 DIAGNOSIS — G500 Trigeminal neuralgia: Secondary | ICD-10-CM

## 2013-06-27 LAB — GLUCOSE, CAPILLARY
Glucose-Capillary: 99 mg/dL (ref 70–99)
Glucose-Capillary: 120 mg/dL — ABNORMAL HIGH (ref 70–99)

## 2013-06-27 LAB — CBC
HCT: 30.5 % — ABNORMAL LOW (ref 36.0–46.0)
MCHC: 32.1 g/dL (ref 30.0–36.0)
Platelets: 194 10*3/uL (ref 150–400)
RDW: 14.2 % (ref 11.5–15.5)
WBC: 6.4 10*3/uL (ref 4.0–10.5)

## 2013-06-27 LAB — BASIC METABOLIC PANEL
BUN: 16 mg/dL (ref 6–23)
CO2: 29 mEq/L (ref 19–32)
Calcium: 9.4 mg/dL (ref 8.4–10.5)
Chloride: 105 mEq/L (ref 96–112)
Creatinine, Ser: 0.76 mg/dL (ref 0.50–1.10)
GFR calc Af Amer: 89 mL/min — ABNORMAL LOW (ref >90)
GFR calc non Af Amer: 76 mL/min — ABNORMAL LOW (ref >90)

## 2013-06-27 LAB — HEMOGLOBIN A1C
Hgb A1c MFr Bld: 6.4 % — ABNORMAL HIGH (ref <5.7)
Mean Plasma Glucose: 137 mg/dL — ABNORMAL HIGH (ref <117)

## 2013-06-27 LAB — TSH: TSH: 1.773 u[IU]/mL (ref 0.350–4.500)

## 2013-06-27 MED ORDER — CARBAMAZEPINE ER 100 MG PO TB12
ORAL_TABLET | ORAL | Status: AC
  Filled 2013-06-27: qty 1

## 2013-06-27 MED ORDER — ENOXAPARIN SODIUM 40 MG/0.4ML ~~LOC~~ SOLN: 40.0000 mg | Freq: Every day | SUBCUTANEOUS | Status: DC

## 2013-06-27 NOTE — Progress Notes (Signed)
Patient given discharge instructions with family at bedside. Reviewed syncope prevention measures such as gradually moving to a sitting then standing position before ambulation. Also reviewed in depth heart failure preventitive measures. Patient stated that she does not weigh herself daily. Encouraged daily weights.  Explained the "zones", low sodium diet, restricting excessive fluid intake, continuing medications as ordered, and keeping follow-up appointments. Patient and family acknowledged understanding. Patient left in stable condition via wheelchair.
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
## 2013-06-27 NOTE — Progress Notes (Signed)
Nutrition Brief Note  Patient identified on the Malnutrition Screening Tool (MST) Report   Patient Active Problem List   Diagnosis Date Noted  . Syncopal episodes 06/26/2013  . Obesity (BMI 30-39.9) 04/08/2013  . Hx of right BKA 05/31/2012  . Aortic valve stenosis 04/08/2012  . Hypertension   . Arteriosclerotic cardiovascular disease (ASCVD)   . Cerebrovascular disease   . Peripheral vascular disease   . Degenerative joint disease   . Lymphoma   . Restless leg syndrome   . Anemia   . Syncope   . OBESITY 08/11/2010  . DIABETES MELLITUS, TYPE II 06/06/2007  . Hyperlipidemia 06/06/2007  . OVERACTIVE BLADDER 06/06/2007    Wt Readings from Last 15 Encounters:  06/27/13 172 lb 9.9 oz (78.3 kg)  06/18/13 160 lb (72.576 kg)  06/03/13 182 lb (82.555 kg)  05/09/13 168 lb 2 oz (76.261 kg)  04/08/13 199 lb (90.266 kg)  03/24/13 197 lb (89.359 kg)  03/11/13 195 lb (88.451 kg)  01/02/13 204 lb (92.534 kg)  12/27/12 204 lb (92.534 kg)  11/14/12 202 lb (91.627 kg)  10/02/12 203 lb (92.08 kg)  09/05/12 190 lb (86.183 kg)  05/31/12 183 lb (83.008 kg)  04/08/12 195 lb (88.451 kg)  03/28/12 182 lb (82.555 kg)    Body mass index is 33.71 kg/(m^2). Patient meets criteria for obesity class I based on current BMI.   Current diet order is CHO Modified, patient is consuming approximately 75% of meals at this time. Labs and medications reviewed.   No nutrition interventions warranted at this time. If nutrition issues arise, please consult RD.   Royann Shivers MS,RD,CSG,LDN Office: [phone] Pager: [phone]
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
## 2013-07-01 ENCOUNTER — Other Ambulatory Visit: Payer: Self-pay | Admitting: Cardiology

## 2013-07-01 ENCOUNTER — Telehealth: Payer: Self-pay | Admitting: *Deleted

## 2013-07-01 DIAGNOSIS — R55 Syncope and collapse: Secondary | ICD-10-CM

## 2013-07-01 DIAGNOSIS — I350 Nonrheumatic aortic (valve) stenosis: Secondary | ICD-10-CM

## 2013-07-01 NOTE — Telephone Encounter (Signed)
This nurse contacted pt daughter Pedro Earls to confirm correct address/phone number for proper shipping to pt home, address confirmed, pt daughter will allow one week post 30 day monitor prior to contact from our office with results./next steps, cornelia advised her sister Elenora Fender is also allowed to confirm results, order placed in chart, monitor order placed in [employer] to ship out 07-02-13, pt aware placement instructions/shipping label enclosed in package set to arrive 07-03-13.

## 2013-07-01 NOTE — Telephone Encounter (Signed)
PT NEEDS 30 DAY EVENT MONITOR MAILED TO [facility] PER HOSPITAL CONSULTS FROM DR Purvis Sheffield

## 2013-07-04 ENCOUNTER — Ambulatory Visit (INDEPENDENT_AMBULATORY_CARE_PROVIDER_SITE_OTHER): Payer: Medicare Other | Admitting: Family Medicine

## 2013-07-04 VITALS — BP 100/68 | Temp 98.6°F | Wt 178.0 lb

## 2013-07-04 DIAGNOSIS — E119 Type 2 diabetes mellitus without complications: Secondary | ICD-10-CM | POA: Diagnosis not present

## 2013-07-04 DIAGNOSIS — R634 Abnormal weight loss: Secondary | ICD-10-CM

## 2013-07-04 DIAGNOSIS — E785 Hyperlipidemia, unspecified: Secondary | ICD-10-CM

## 2013-07-04 DIAGNOSIS — I10 Essential (primary) hypertension: Secondary | ICD-10-CM

## 2013-07-04 DIAGNOSIS — I2510 Atherosclerotic heart disease of native coronary artery without angina pectoris: Secondary | ICD-10-CM | POA: Diagnosis not present

## 2013-07-04 DIAGNOSIS — R630 Anorexia: Secondary | ICD-10-CM

## 2013-07-04 DIAGNOSIS — R55 Syncope and collapse: Secondary | ICD-10-CM

## 2013-07-04 DIAGNOSIS — I7090 Unspecified atherosclerosis: Secondary | ICD-10-CM | POA: Diagnosis not present

## 2013-07-04 DIAGNOSIS — C8589 Other specified types of non-Hodgkin lymphoma, extranodal and solid organ sites: Secondary | ICD-10-CM

## 2013-07-04 DIAGNOSIS — C8590 Non-Hodgkin lymphoma, unspecified, unspecified site: Secondary | ICD-10-CM

## 2013-07-04 DIAGNOSIS — I679 Cerebrovascular disease, unspecified: Secondary | ICD-10-CM

## 2013-07-04 NOTE — Patient Instructions (Signed)
-  talk to your cardiologist and neurologist about whether it might be a good idea to decrease some of your blood pressure medications if blood pressure continues to run on lower end  -take ensure or boost with each meal and let me know if you wish to pursue further workup for your weight loss and weakness  -keep follow up with you neurologist and cardiologist regarding your syncope (passing out)

## 2013-07-04 NOTE — Telephone Encounter (Signed)
Called pt to see if they had put on monitor. Pt states they are wearing the monitor.

## 2013-07-04 NOTE — Progress Notes (Signed)
Pre visit review using our clinic review tool, if applicable. No additional management support is needed unless otherwise documented below in the visit note. 
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
## 2013-07-08 ENCOUNTER — Ambulatory Visit: Payer: Medicare Other | Admitting: Family Medicine

## 2013-07-08 DIAGNOSIS — B351 Tinea unguium: Secondary | ICD-10-CM | POA: Diagnosis not present

## 2013-07-08 DIAGNOSIS — E1159 Type 2 diabetes mellitus with other circulatory complications: Secondary | ICD-10-CM | POA: Diagnosis not present

## 2013-07-08 DIAGNOSIS — L851 Acquired keratosis [keratoderma] palmaris et plantaris: Secondary | ICD-10-CM | POA: Diagnosis not present

## 2013-07-09 DIAGNOSIS — R55 Syncope and collapse: Secondary | ICD-10-CM | POA: Diagnosis not present

## 2013-07-09 DIAGNOSIS — G500 Trigeminal neuralgia: Secondary | ICD-10-CM | POA: Diagnosis not present

## 2013-07-09 DIAGNOSIS — I658 Occlusion and stenosis of other precerebral arteries: Secondary | ICD-10-CM | POA: Diagnosis not present

## 2013-07-14 ENCOUNTER — Encounter: Payer: Self-pay | Admitting: Family Medicine

## 2013-07-14 NOTE — Progress Notes (Signed)
Received OV note from Neurology appt on 07/06/13 for syncope eval. Thought to be cardiac and pt undergoing workup with cardiology. Plan per Dr. Jilda Panda: to get MR angio neck and brain and EEG, continue carbamazepine. Follow up 3 months.

## 2013-07-25 DIAGNOSIS — R55 Syncope and collapse: Secondary | ICD-10-CM | POA: Diagnosis not present

## 2013-08-05 ENCOUNTER — Other Ambulatory Visit: Payer: Self-pay | Admitting: *Deleted

## 2013-08-05 ENCOUNTER — Telehealth: Payer: Self-pay | Admitting: Family Medicine

## 2013-08-05 DIAGNOSIS — I350 Nonrheumatic aortic (valve) stenosis: Secondary | ICD-10-CM

## 2013-08-05 DIAGNOSIS — R55 Syncope and collapse: Secondary | ICD-10-CM

## 2013-08-05 NOTE — Telephone Encounter (Signed)
Faxed

## 2013-08-05 NOTE — Telephone Encounter (Signed)
Prescription written and ready to be signed and faxed.

## 2013-08-05 NOTE — Telephone Encounter (Signed)
Gregary Signs from [facility] is calling to request prosthetic liners on behalf of patient.  Please fax written order to [phone]. Please advise.

## 2013-08-06 DIAGNOSIS — R51 Headache: Secondary | ICD-10-CM | POA: Diagnosis not present

## 2013-08-06 DIAGNOSIS — I6529 Occlusion and stenosis of unspecified carotid artery: Secondary | ICD-10-CM | POA: Diagnosis not present

## 2013-08-06 DIAGNOSIS — R55 Syncope and collapse: Secondary | ICD-10-CM | POA: Diagnosis not present

## 2013-08-06 DIAGNOSIS — G9389 Other specified disorders of brain: Secondary | ICD-10-CM | POA: Diagnosis not present

## 2013-08-12 ENCOUNTER — Encounter: Payer: Self-pay | Admitting: Family Medicine

## 2013-08-12 NOTE — Progress Notes (Signed)
Received office notes from [facility] from visit on 08/07/2013.  Pt was seen for left facial pain attributed to trigeminal neuralgia on 06/09/13 and told to increase carbamazepine to 200 mg bid.  Per patient she has been seen pain free since then.   Pt to check with PCP on Vitamin D and B12 levels as well as BP, cholesterol, and blood sugar.  Pt to sign release and get the report of 30 days of cardiac monitor and echocardiogram.    Pt to continue ASA and Plavix and Pravachol.

## 2013-08-21 ENCOUNTER — Other Ambulatory Visit: Payer: Self-pay | Admitting: Family Medicine

## 2013-09-01 DIAGNOSIS — M064 Inflammatory polyarthropathy: Secondary | ICD-10-CM | POA: Diagnosis not present

## 2013-09-09 ENCOUNTER — Encounter: Payer: Self-pay | Admitting: Cardiology

## 2013-09-09 ENCOUNTER — Ambulatory Visit (INDEPENDENT_AMBULATORY_CARE_PROVIDER_SITE_OTHER): Payer: Medicare Other | Admitting: Cardiology

## 2013-09-09 VITALS — BP 120/40 | HR 70 | Ht 60.0 in | Wt 173.8 lb

## 2013-09-09 DIAGNOSIS — I359 Nonrheumatic aortic valve disorder, unspecified: Secondary | ICD-10-CM | POA: Diagnosis not present

## 2013-09-09 DIAGNOSIS — I350 Nonrheumatic aortic (valve) stenosis: Secondary | ICD-10-CM

## 2013-09-09 DIAGNOSIS — I10 Essential (primary) hypertension: Secondary | ICD-10-CM

## 2013-09-09 DIAGNOSIS — R55 Syncope and collapse: Secondary | ICD-10-CM

## 2013-09-09 DIAGNOSIS — I2510 Atherosclerotic heart disease of native coronary artery without angina pectoris: Secondary | ICD-10-CM | POA: Diagnosis not present
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
## 2013-09-09 NOTE — Patient Instructions (Signed)
Your physician recommends that you schedule a follow-up appointment in: [place] has recommended you make the following change in your medication:   1) DECREASE COREG 12.5MG  TWICE DAILY

## 2013-09-19 ENCOUNTER — Telehealth: Payer: Self-pay | Admitting: *Deleted

## 2013-09-19 MED ORDER — CLOPIDOGREL BISULFATE 75 MG PO TABS: 75.0000 mg | ORAL_TABLET | Freq: Every day | ORAL | Status: DC

## 2013-09-19 NOTE — Telephone Encounter (Signed)
Medication sent via escribe.  

## 2013-09-19 NOTE — Telephone Encounter (Signed)
Pt needs plavix called in today she has been out for a couple days, pharmacy loaned her two. [facility]

## 2013-10-09 DIAGNOSIS — B351 Tinea unguium: Secondary | ICD-10-CM | POA: Diagnosis not present

## 2013-10-09 DIAGNOSIS — E1159 Type 2 diabetes mellitus with other circulatory complications: Secondary | ICD-10-CM | POA: Diagnosis not present

## 2013-10-09 DIAGNOSIS — L851 Acquired keratosis [keratoderma] palmaris et plantaris: Secondary | ICD-10-CM | POA: Diagnosis not present

## 2013-10-15 ENCOUNTER — Telehealth: Payer: Self-pay | Admitting: Family Medicine

## 2013-10-15 NOTE — Telephone Encounter (Signed)
Needs order for a new replacement socket for pt's orthotics, decay is present in current orthotic.    Fax order to [phone].

## 2013-10-16 NOTE — Telephone Encounter (Signed)
Please have company fax [place] the specific order to sign so that correct product is ordered. Thanks.

## 2013-10-16 NOTE — Telephone Encounter (Signed)
Per Gregary Signs fax over order for right BK socket replacement. Faxed to [facility]. Conformation received.

## 2013-10-17 ENCOUNTER — Emergency Department (HOSPITAL_COMMUNITY): Payer: Medicare Other

## 2013-10-17 ENCOUNTER — Emergency Department (HOSPITAL_COMMUNITY)
Admission: EM | Admit: 2013-10-17 | Discharge: 2013-10-17 | Disposition: A | Discharge status: Home / Self Care | Payer: Medicare Other | Attending: Emergency Medicine | Admitting: Emergency Medicine

## 2013-10-17 ENCOUNTER — Encounter (HOSPITAL_COMMUNITY): Payer: Self-pay | Admitting: Emergency Medicine

## 2013-10-17 DIAGNOSIS — R55 Syncope and collapse: Secondary | ICD-10-CM | POA: Insufficient documentation

## 2013-10-17 DIAGNOSIS — Z7982 Long term (current) use of aspirin: Secondary | ICD-10-CM | POA: Insufficient documentation

## 2013-10-17 DIAGNOSIS — Z79899 Other long term (current) drug therapy: Secondary | ICD-10-CM | POA: Insufficient documentation

## 2013-10-17 DIAGNOSIS — M19049 Primary osteoarthritis, unspecified hand: Secondary | ICD-10-CM | POA: Diagnosis not present

## 2013-10-17 DIAGNOSIS — Z7902 Long term (current) use of antithrombotics/antiplatelets: Secondary | ICD-10-CM | POA: Insufficient documentation

## 2013-10-17 DIAGNOSIS — Z8619 Personal history of other infectious and parasitic diseases: Secondary | ICD-10-CM | POA: Insufficient documentation

## 2013-10-17 DIAGNOSIS — J069 Acute upper respiratory infection, unspecified: Secondary | ICD-10-CM | POA: Diagnosis not present

## 2013-10-17 DIAGNOSIS — R5383 Other fatigue: Secondary | ICD-10-CM | POA: Diagnosis not present

## 2013-10-17 DIAGNOSIS — R404 Transient alteration of awareness: Secondary | ICD-10-CM | POA: Diagnosis not present

## 2013-10-17 DIAGNOSIS — IMO0002 Reserved for concepts with insufficient information to code with codable children: Secondary | ICD-10-CM | POA: Insufficient documentation

## 2013-10-17 DIAGNOSIS — I10 Essential (primary) hypertension: Secondary | ICD-10-CM | POA: Insufficient documentation

## 2013-10-17 DIAGNOSIS — R5381 Other malaise: Secondary | ICD-10-CM | POA: Diagnosis not present

## 2013-10-17 DIAGNOSIS — E785 Hyperlipidemia, unspecified: Secondary | ICD-10-CM | POA: Insufficient documentation

## 2013-10-17 DIAGNOSIS — Z8669 Personal history of other diseases of the nervous system and sense organs: Secondary | ICD-10-CM | POA: Insufficient documentation

## 2013-10-17 DIAGNOSIS — Z87820 Personal history of traumatic brain injury: Secondary | ICD-10-CM | POA: Diagnosis not present

## 2013-10-17 DIAGNOSIS — R062 Wheezing: Secondary | ICD-10-CM | POA: Diagnosis not present

## 2013-10-17 DIAGNOSIS — Z8673 Personal history of transient ischemic attack (TIA), and cerebral infarction without residual deficits: Secondary | ICD-10-CM | POA: Diagnosis not present

## 2013-10-17 DIAGNOSIS — E119 Type 2 diabetes mellitus without complications: Secondary | ICD-10-CM | POA: Insufficient documentation

## 2013-10-17 LAB — URINALYSIS, ROUTINE W REFLEX MICROSCOPIC
Bilirubin Urine: NEGATIVE
Glucose, UA: NEGATIVE mg/dL
HGB URINE DIPSTICK: NEGATIVE
LEUKOCYTES UA: NEGATIVE
Nitrite: NEGATIVE
PROTEIN: NEGATIVE mg/dL
Specific Gravity, Urine: 1.025 (ref 1.005–1.030)
Urobilinogen, UA: 0.2 mg/dL (ref 0.0–1.0)
pH: 6 (ref 5.0–8.0)

## 2013-10-17 LAB — COMPREHENSIVE METABOLIC PANEL
ALT: 11 U/L (ref 0–35)
AST: 19 U/L (ref 0–37)
Albumin: 3.4 g/dL — ABNORMAL LOW (ref 3.5–5.2)
Alkaline Phosphatase: 79 U/L (ref 39–117)
BILIRUBIN TOTAL: 0.2 mg/dL — AB (ref 0.3–1.2)
BUN: 15 mg/dL (ref 6–23)
CO2: 30 meq/L (ref 19–32)
CREATININE: 0.71 mg/dL (ref 0.50–1.10)
Calcium: 9.4 mg/dL (ref 8.4–10.5)
Chloride: 100 mEq/L (ref 96–112)
GFR calc Af Amer: 90 mL/min — ABNORMAL LOW (ref >90)
GFR, EST NON AFRICAN AMERICAN: 78 mL/min — AB (ref >90)
GLUCOSE: 136 mg/dL — AB (ref 70–99)
Potassium: 4.4 mEq/L (ref 3.7–5.3)
Sodium: 140 mEq/L (ref 137–147)
Total Protein: 6.7 g/dL (ref 6.0–8.3)

## 2013-10-17 LAB — CBC WITH DIFFERENTIAL/PLATELET
BASOS ABS: 0 10*3/uL (ref 0.0–0.1)
Basophils Relative: 0 % (ref 0–1)
Eosinophils Absolute: 0.1 10*3/uL (ref 0.0–0.7)
Eosinophils Relative: 1 % (ref 0–5)
HEMATOCRIT: 32.1 % — AB (ref 36.0–46.0)
HEMOGLOBIN: 10.5 g/dL — AB (ref 12.0–15.0)
LYMPHS ABS: 0.5 10*3/uL — AB (ref 0.7–4.0)
Lymphocytes Relative: 13 % (ref 12–46)
MCH: 28.1 pg (ref 26.0–34.0)
MCHC: 32.7 g/dL (ref 30.0–36.0)
MCV: 85.8 fL (ref 78.0–100.0)
MONOS PCT: 14 % — AB (ref 3–12)
Monocytes Absolute: 0.6 10*3/uL (ref 0.1–1.0)
NEUTROS ABS: 3.1 10*3/uL (ref 1.7–7.7)
Neutrophils Relative %: 72 % (ref 43–77)
Platelets: 159 10*3/uL (ref 150–400)
RBC: 3.74 MIL/uL — AB (ref 3.87–5.11)
RDW: 13.2 % (ref 11.5–15.5)
WBC: 4.3 10*3/uL (ref 4.0–10.5)

## 2013-10-17 LAB — TROPONIN I: Troponin I: <0.3 ng/mL (ref <0.30)

## 2013-10-17 MED ORDER — SODIUM CHLORIDE 0.9 % IV BOLUS (SEPSIS)
500.0000 mL | Freq: Once | INTRAVENOUS | Status: AC
  Administered 2013-10-17: 500 mL via INTRAVENOUS

## 2013-10-17 MED ORDER — LEVOFLOXACIN 500 MG PO TABS: 750.0000 mg | ORAL_TABLET | Freq: Every day | ORAL | Status: DC

## 2013-10-17 MED ORDER — LEVOFLOXACIN 500 MG PO TABS
500.0000 mg | ORAL_TABLET | Freq: Once | ORAL | Status: AC
  Administered 2013-10-17: 500 mg via ORAL
  Filled 2013-10-17: qty 1

## 2013-10-17 NOTE — Discharge Instructions (Signed)
Follow up with your md next week. °
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
## 2013-10-17 NOTE — ED Notes (Signed)
Patient denies nausea and vomiting, states she has had some diarrhea.

## 2013-10-17 NOTE — ED Notes (Signed)
Had been having cold symptoms and feeling weak. Sleeping and hard to arouse at home. EMS noticed some medication that is for night time sitting at the kitchen table.

## 2013-10-18 NOTE — Progress Notes (Signed)
Incoming call from [facility]( pharmacist [place] requesting clarification on an order for Levaquin written on 10/17/13.CM completed a correction sheet and will liaise with provider in pod E.

## 2013-10-18 NOTE — Progress Notes (Signed)
Spoke with Irena Cords PA. Order for Levofloxacin 750 MG once daily for 7 days ( called in by Irena Cords) No further CM needs identified.

## 2013-10-20 ENCOUNTER — Telehealth: Payer: Self-pay | Admitting: Cardiology

## 2013-10-20 NOTE — Telephone Encounter (Signed)
Looks like from ER notes she was discharged with an antibiotic levaquin for a possible infection. I would give that a few days to see if she begins to feel better, have them call us back later in the week or early next week if needed. From the tests they did in the hospital does not appear to be a cardiology related problem.  Carlyle Dolly MD

## 2013-10-20 NOTE — Telephone Encounter (Signed)
Please offer them an add on appointment in [place] if they are willing, I think it would be best to go over everything face to face. If not, then let me know and we can try over the phone.   Carlyle Dolly MD

## 2013-10-20 NOTE — Telephone Encounter (Signed)
We can do 1120 on Wednesday   Alexandr Oehler

## 2013-10-20 NOTE — Telephone Encounter (Signed)
Pt made aware to call office back in a few days to let us know how she is feeling.

## 2013-10-20 NOTE — Telephone Encounter (Signed)
Did you want that appt for this week in [place]? You are full all week. Please let me know

## 2013-10-20 NOTE — Telephone Encounter (Signed)
Her daughter called back. She is thinking it is her mothers heart. She stated that she had a cardiac monitor put on and it did not show anything. She also states that her mother passes out often and she can only take a few steps when getting SOB. She wants to know " if there is anything she can put on her heart to find out what she needs." She is determined to have an answer and wants you to call her.

## 2013-10-20 NOTE — Telephone Encounter (Signed)
Pt's daughter made aware of appt.

## 2013-10-21 ENCOUNTER — Encounter (HOSPITAL_COMMUNITY): Payer: Self-pay | Admitting: Emergency Medicine

## 2013-10-21 ENCOUNTER — Inpatient Hospital Stay (HOSPITAL_COMMUNITY)
Admission: EM | Admit: 2013-10-21 | Discharge: 2013-10-23 | DRG: 262 | Disposition: A | Discharge status: Home / Self Care | Payer: Medicare Other | Attending: Internal Medicine | Admitting: Internal Medicine

## 2013-10-21 DIAGNOSIS — I350 Nonrheumatic aortic (valve) stenosis: Secondary | ICD-10-CM

## 2013-10-21 DIAGNOSIS — E785 Hyperlipidemia, unspecified: Secondary | ICD-10-CM | POA: Diagnosis not present

## 2013-10-21 DIAGNOSIS — E669 Obesity, unspecified: Secondary | ICD-10-CM | POA: Diagnosis not present

## 2013-10-21 DIAGNOSIS — Z89511 Acquired absence of right leg below knee: Secondary | ICD-10-CM

## 2013-10-21 DIAGNOSIS — I2510 Atherosclerotic heart disease of native coronary artery without angina pectoris: Secondary | ICD-10-CM | POA: Diagnosis present

## 2013-10-21 DIAGNOSIS — R5381 Other malaise: Secondary | ICD-10-CM | POA: Diagnosis not present

## 2013-10-21 DIAGNOSIS — I739 Peripheral vascular disease, unspecified: Secondary | ICD-10-CM

## 2013-10-21 DIAGNOSIS — D649 Anemia, unspecified: Secondary | ICD-10-CM | POA: Diagnosis not present

## 2013-10-21 DIAGNOSIS — I679 Cerebrovascular disease, unspecified: Secondary | ICD-10-CM

## 2013-10-21 DIAGNOSIS — Z8673 Personal history of transient ischemic attack (TIA), and cerebral infarction without residual deficits: Secondary | ICD-10-CM | POA: Diagnosis not present

## 2013-10-21 DIAGNOSIS — I10 Essential (primary) hypertension: Secondary | ICD-10-CM | POA: Diagnosis not present

## 2013-10-21 DIAGNOSIS — R55 Syncope and collapse: Secondary | ICD-10-CM | POA: Diagnosis not present

## 2013-10-21 DIAGNOSIS — Z7982 Long term (current) use of aspirin: Secondary | ICD-10-CM

## 2013-10-21 DIAGNOSIS — I7090 Unspecified atherosclerosis: Secondary | ICD-10-CM

## 2013-10-21 DIAGNOSIS — E119 Type 2 diabetes mellitus without complications: Secondary | ICD-10-CM | POA: Diagnosis present

## 2013-10-21 DIAGNOSIS — M1990 Unspecified osteoarthritis, unspecified site: Secondary | ICD-10-CM

## 2013-10-21 DIAGNOSIS — Z9861 Coronary angioplasty status: Secondary | ICD-10-CM

## 2013-10-21 DIAGNOSIS — N318 Other neuromuscular dysfunction of bladder: Secondary | ICD-10-CM

## 2013-10-21 DIAGNOSIS — I509 Heart failure, unspecified: Secondary | ICD-10-CM | POA: Diagnosis present

## 2013-10-21 DIAGNOSIS — I359 Nonrheumatic aortic valve disorder, unspecified: Secondary | ICD-10-CM | POA: Diagnosis present

## 2013-10-21 DIAGNOSIS — R5383 Other fatigue: Secondary | ICD-10-CM | POA: Diagnosis not present

## 2013-10-21 DIAGNOSIS — Z79899 Other long term (current) drug therapy: Secondary | ICD-10-CM

## 2013-10-21 DIAGNOSIS — G2581 Restless legs syndrome: Secondary | ICD-10-CM

## 2013-10-21 DIAGNOSIS — C8590 Non-Hodgkin lymphoma, unspecified, unspecified site: Secondary | ICD-10-CM

## 2013-10-21 DIAGNOSIS — R404 Transient alteration of awareness: Secondary | ICD-10-CM | POA: Diagnosis not present

## 2013-10-21 DIAGNOSIS — D638 Anemia in other chronic diseases classified elsewhere: Secondary | ICD-10-CM | POA: Diagnosis present

## 2013-10-21 LAB — BASIC METABOLIC PANEL
BUN: 19 mg/dL (ref 6–23)
CO2: 27 mEq/L (ref 19–32)
Calcium: 9.1 mg/dL (ref 8.4–10.5)
Chloride: 100 mEq/L (ref 96–112)
Creatinine, Ser: 0.9 mg/dL (ref 0.50–1.10)
GFR calc Af Amer: 67 mL/min — ABNORMAL LOW (ref >90)
GFR calc non Af Amer: 58 mL/min — ABNORMAL LOW (ref >90)
Glucose, Bld: 148 mg/dL — ABNORMAL HIGH (ref 70–99)
Potassium: 4.2 mEq/L (ref 3.7–5.3)
Sodium: 141 mEq/L (ref 137–147)

## 2013-10-21 LAB — TROPONIN I: Troponin I: <0.3 ng/mL (ref <0.30)

## 2013-10-21 LAB — URINALYSIS, ROUTINE W REFLEX MICROSCOPIC
Bilirubin Urine: NEGATIVE
Glucose, UA: NEGATIVE mg/dL
Hgb urine dipstick: NEGATIVE
Ketones, ur: NEGATIVE mg/dL
Leukocytes, UA: NEGATIVE
Nitrite: NEGATIVE
Protein, ur: NEGATIVE mg/dL
Specific Gravity, Urine: >1.03 — ABNORMAL HIGH (ref 1.005–1.030)
Urobilinogen, UA: 0.2 mg/dL (ref 0.0–1.0)
pH: 5.5 (ref 5.0–8.0)

## 2013-10-21 LAB — CBC WITH DIFFERENTIAL/PLATELET
Basophils Absolute: 0 10*3/uL (ref 0.0–0.1)
Basophils Relative: 0 % (ref 0–1)
Eosinophils Absolute: 0 10*3/uL (ref 0.0–0.7)
Eosinophils Relative: 1 % (ref 0–5)
HCT: 34 % — ABNORMAL LOW (ref 36.0–46.0)
Hemoglobin: 10.9 g/dL — ABNORMAL LOW (ref 12.0–15.0)
Lymphocytes Relative: 14 % (ref 12–46)
Lymphs Abs: 0.7 10*3/uL (ref 0.7–4.0)
MCH: 27.3 pg (ref 26.0–34.0)
MCHC: 32.1 g/dL (ref 30.0–36.0)
MCV: 85.2 fL (ref 78.0–100.0)
Monocytes Absolute: 0.4 10*3/uL (ref 0.1–1.0)
Monocytes Relative: 9 % (ref 3–12)
Neutro Abs: 3.5 10*3/uL (ref 1.7–7.7)
Neutrophils Relative %: 76 % (ref 43–77)
Platelets: 141 10*3/uL — ABNORMAL LOW (ref 150–400)
RBC: 3.99 MIL/uL (ref 3.87–5.11)
RDW: 13.1 % (ref 11.5–15.5)
WBC: 4.6 10*3/uL (ref 4.0–10.5)

## 2013-10-21 MED ORDER — HYDROMORPHONE HCL PF 1 MG/ML IJ SOLN: 0.5000 mg | INTRAMUSCULAR | Status: DC | PRN

## 2013-10-21 MED ORDER — LINAGLIPTIN 5 MG PO TABS
5.0000 mg | ORAL_TABLET | Freq: Every day | ORAL | Status: DC
  Administered 2013-10-22 – 2013-10-23 (×2): 5 mg via ORAL
  Filled 2013-10-21 (×3): qty 1

## 2013-10-21 MED ORDER — SODIUM CHLORIDE 0.9 % IJ SOLN
3.0000 mL | Freq: Two times a day (BID) | INTRAMUSCULAR | Status: DC
  Administered 2013-10-22: 3 mL via INTRAVENOUS

## 2013-10-21 MED ORDER — SODIUM CHLORIDE 0.9 % IJ SOLN
3.0000 mL | Freq: Two times a day (BID) | INTRAMUSCULAR | Status: DC
  Administered 2013-10-22 (×3): 3 mL via INTRAVENOUS

## 2013-10-21 MED ORDER — INSULIN ASPART 100 UNIT/ML ~~LOC~~ SOLN: 0.0000 [IU] | Freq: Every day | SUBCUTANEOUS | Status: DC

## 2013-10-21 MED ORDER — ONE-DAILY MULTI VITAMINS PO TABS
1.0000 | ORAL_TABLET | Freq: Every day | ORAL | Status: DC
  Filled 2013-10-21: qty 1

## 2013-10-21 MED ORDER — PREDNISONE 5 MG PO TABS
5.0000 mg | ORAL_TABLET | Freq: Every day | ORAL | Status: DC
  Administered 2013-10-22 – 2013-10-23 (×2): 5 mg via ORAL
  Filled 2013-10-21 (×2): qty 1

## 2013-10-21 MED ORDER — SITAGLIPTIN PHOS-METFORMIN HCL 50-1000 MG PO TABS: 1.0000 | ORAL_TABLET | Freq: Two times a day (BID) | ORAL | Status: DC

## 2013-10-21 MED ORDER — ACETAMINOPHEN 650 MG RE SUPP: 650.0000 mg | Freq: Four times a day (QID) | RECTAL | Status: DC | PRN

## 2013-10-21 MED ORDER — INSULIN ASPART 100 UNIT/ML ~~LOC~~ SOLN
0.0000 [IU] | Freq: Three times a day (TID) | SUBCUTANEOUS | Status: DC
  Administered 2013-10-22: 1 [IU] via SUBCUTANEOUS

## 2013-10-21 MED ORDER — SODIUM CHLORIDE 0.9 % IV SOLN: 250.0000 mL | INTRAVENOUS | Status: DC | PRN

## 2013-10-21 MED ORDER — LOSARTAN POTASSIUM 50 MG PO TABS
100.0000 mg | ORAL_TABLET | Freq: Every day | ORAL | Status: DC
  Administered 2013-10-22 – 2013-10-23 (×2): 100 mg via ORAL
  Filled 2013-10-21 (×2): qty 2

## 2013-10-21 MED ORDER — ONDANSETRON HCL 4 MG PO TABS: 4.0000 mg | ORAL_TABLET | Freq: Four times a day (QID) | ORAL | Status: DC | PRN

## 2013-10-21 MED ORDER — ACETAMINOPHEN 325 MG PO TABS: 650.0000 mg | ORAL_TABLET | Freq: Four times a day (QID) | ORAL | Status: DC | PRN

## 2013-10-21 MED ORDER — CARVEDILOL 12.5 MG PO TABS
12.5000 mg | ORAL_TABLET | Freq: Every day | ORAL | Status: DC
  Administered 2013-10-22 – 2013-10-23 (×2): 12.5 mg via ORAL
  Filled 2013-10-21 (×3): qty 1

## 2013-10-21 MED ORDER — ALUM & MAG HYDROXIDE-SIMETH 200-200-20 MG/5ML PO SUSP: 30.0000 mL | Freq: Four times a day (QID) | ORAL | Status: DC | PRN

## 2013-10-21 MED ORDER — ADULT MULTIVITAMIN W/MINERALS CH
1.0000 | ORAL_TABLET | Freq: Every day | ORAL | Status: DC
  Administered 2013-10-22 – 2013-10-23 (×2): 1 via ORAL
  Filled 2013-10-21 (×2): qty 1

## 2013-10-21 MED ORDER — OXYCODONE HCL 5 MG PO TABS: 5.0000 mg | ORAL_TABLET | ORAL | Status: DC | PRN

## 2013-10-21 MED ORDER — ENOXAPARIN SODIUM 30 MG/0.3ML ~~LOC~~ SOLN
30.0000 mg | SUBCUTANEOUS | Status: DC
  Administered 2013-10-22: 30 mg via SUBCUTANEOUS
  Filled 2013-10-21: qty 0.3

## 2013-10-21 MED ORDER — ONDANSETRON HCL 4 MG/2ML IJ SOLN: 4.0000 mg | Freq: Four times a day (QID) | INTRAMUSCULAR | Status: DC | PRN

## 2013-10-21 MED ORDER — CARBAMAZEPINE ER 100 MG PO TB12
100.0000 mg | ORAL_TABLET | Freq: Two times a day (BID) | ORAL | Status: DC
  Administered 2013-10-22 (×2): 100 mg via ORAL
  Filled 2013-10-21 (×7): qty 1

## 2013-10-21 MED ORDER — LEVOFLOXACIN 750 MG PO TABS
750.0000 mg | ORAL_TABLET | Freq: Every day | ORAL | Status: DC
  Administered 2013-10-22 – 2013-10-23 (×2): 750 mg via ORAL
  Filled 2013-10-21 (×2): qty 1

## 2013-10-21 MED ORDER — NYSTATIN 100000 UNIT/ML MT SUSP
5.0000 mL | Freq: Four times a day (QID) | OROMUCOSAL | Status: DC
  Administered 2013-10-22 – 2013-10-23 (×6): 500000 [IU] via ORAL
  Filled 2013-10-21 (×9): qty 5

## 2013-10-21 MED ORDER — SIMVASTATIN 20 MG PO TABS
20.0000 mg | ORAL_TABLET | Freq: Every day | ORAL | Status: DC
  Administered 2013-10-22: 20 mg via ORAL
  Filled 2013-10-21 (×2): qty 1

## 2013-10-21 MED ORDER — METFORMIN HCL 500 MG PO TABS
1000.0000 mg | ORAL_TABLET | Freq: Two times a day (BID) | ORAL | Status: DC
  Administered 2013-10-22 – 2013-10-23 (×3): 1000 mg via ORAL
  Filled 2013-10-21 (×5): qty 2

## 2013-10-21 MED ORDER — CLOPIDOGREL BISULFATE 75 MG PO TABS
75.0000 mg | ORAL_TABLET | Freq: Every day | ORAL | Status: DC
  Administered 2013-10-22 – 2013-10-23 (×2): 75 mg via ORAL
  Filled 2013-10-21 (×2): qty 1

## 2013-10-21 MED ORDER — SODIUM CHLORIDE 0.9 % IJ SOLN: 3.0000 mL | INTRAMUSCULAR | Status: DC | PRN

## 2013-10-21 MED ORDER — ASPIRIN EC 81 MG PO TBEC
81.0000 mg | DELAYED_RELEASE_TABLET | Freq: Every day | ORAL | Status: DC
  Administered 2013-10-22 – 2013-10-23 (×2): 81 mg via ORAL
  Filled 2013-10-21 (×2): qty 1

## 2013-10-21 MED ORDER — FAMOTIDINE 20 MG PO TABS
20.0000 mg | ORAL_TABLET | Freq: Every day | ORAL | Status: DC
  Administered 2013-10-22 – 2013-10-23 (×2): 20 mg via ORAL
  Filled 2013-10-21 (×2): qty 1

## 2013-10-21 MED ORDER — AMLODIPINE BESYLATE 10 MG PO TABS
10.0000 mg | ORAL_TABLET | Freq: Every day | ORAL | Status: DC
  Administered 2013-10-22 – 2013-10-23 (×2): 10 mg via ORAL
  Filled 2013-10-21: qty 1
  Filled 2013-10-21: qty 2

## 2013-10-21 MED ORDER — HYDROCHLOROTHIAZIDE 25 MG PO TABS
25.0000 mg | ORAL_TABLET | ORAL | Status: DC
  Administered 2013-10-23: 25 mg via ORAL
  Filled 2013-10-21: qty 1

## 2013-10-21 MED ORDER — DOCUSATE SODIUM 100 MG PO CAPS
100.0000 mg | ORAL_CAPSULE | Freq: Two times a day (BID) | ORAL | Status: DC
  Administered 2013-10-22 – 2013-10-23 (×4): 100 mg via ORAL
  Filled 2013-10-21 (×5): qty 1

## 2013-10-21 NOTE — ED Notes (Signed)
Pt states she has been vomiting for three days and decreased appetite. States she was here on the 20 th for same thing
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
## 2013-10-21 NOTE — ED Notes (Signed)
OK TO BE TRANSPORTED UPSTAIRS W/O TELEMETRY. PT TO BE ON TELEMETRY WHILE UPSTAIRS. OK'D BY DR. Arnoldo Morale
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
## 2013-10-21 NOTE — ED Notes (Signed)
Patient's daughter states she is leaving but wants to leave a number for [place] to call with bed assignment when we receive it. Daughter's name is Deirdre, number is [phone].

## 2013-10-21 NOTE — ED Notes (Signed)
Patient states she has been vomiting x 3 days. States last emesis was yesterday approximately 1500. Patient denies pain at this time.

## 2013-10-22 ENCOUNTER — Ambulatory Visit: Payer: Medicare Other | Admitting: Cardiology

## 2013-10-22 DIAGNOSIS — I359 Nonrheumatic aortic valve disorder, unspecified: Secondary | ICD-10-CM | POA: Diagnosis not present

## 2013-10-22 DIAGNOSIS — D649 Anemia, unspecified: Secondary | ICD-10-CM | POA: Diagnosis not present

## 2013-10-22 DIAGNOSIS — I10 Essential (primary) hypertension: Secondary | ICD-10-CM | POA: Diagnosis not present

## 2013-10-22 DIAGNOSIS — S88119A Complete traumatic amputation at level between knee and ankle, unspecified lower leg, initial encounter: Secondary | ICD-10-CM

## 2013-10-22 DIAGNOSIS — R55 Syncope and collapse: Secondary | ICD-10-CM | POA: Diagnosis not present

## 2013-10-22 DIAGNOSIS — E785 Hyperlipidemia, unspecified: Secondary | ICD-10-CM | POA: Diagnosis not present

## 2013-10-22 DIAGNOSIS — I2510 Atherosclerotic heart disease of native coronary artery without angina pectoris: Secondary | ICD-10-CM | POA: Diagnosis not present

## 2013-10-22 DIAGNOSIS — E119 Type 2 diabetes mellitus without complications: Secondary | ICD-10-CM

## 2013-10-22 DIAGNOSIS — I739 Peripheral vascular disease, unspecified: Secondary | ICD-10-CM

## 2013-10-22 DIAGNOSIS — I679 Cerebrovascular disease, unspecified: Secondary | ICD-10-CM

## 2013-10-22 LAB — BASIC METABOLIC PANEL
BUN: 16 mg/dL (ref 6–23)
CO2: 28 mEq/L (ref 19–32)
CREATININE: 0.81 mg/dL (ref 0.50–1.10)
Calcium: 8.8 mg/dL (ref 8.4–10.5)
Chloride: 103 mEq/L (ref 96–112)
GFR calc Af Amer: 76 mL/min — ABNORMAL LOW (ref >90)
GFR, EST NON AFRICAN AMERICAN: 65 mL/min — AB (ref >90)
Glucose, Bld: 78 mg/dL (ref 70–99)
Potassium: 4 mEq/L (ref 3.7–5.3)
SODIUM: 143 meq/L (ref 137–147)

## 2013-10-22 LAB — CBC
HCT: 30.6 % — ABNORMAL LOW (ref 36.0–46.0)
Hemoglobin: 10.1 g/dL — ABNORMAL LOW (ref 12.0–15.0)
MCH: 28 pg (ref 26.0–34.0)
MCHC: 33 g/dL (ref 30.0–36.0)
MCV: 84.8 fL (ref 78.0–100.0)
Platelets: 148 10*3/uL — ABNORMAL LOW (ref 150–400)
RBC: 3.61 MIL/uL — AB (ref 3.87–5.11)
RDW: 13.1 % (ref 11.5–15.5)
WBC: 3.4 10*3/uL — ABNORMAL LOW (ref 4.0–10.5)

## 2013-10-22 LAB — GLUCOSE, CAPILLARY
GLUCOSE-CAPILLARY: 137 mg/dL — AB (ref 70–99)
Glucose-Capillary: 93 mg/dL (ref 70–99)
Glucose-Capillary: 111 mg/dL — ABNORMAL HIGH (ref 70–99)
Glucose-Capillary: 82 mg/dL (ref 70–99)

## 2013-10-22 LAB — HEMOGLOBIN A1C
HEMOGLOBIN A1C: 6.2 % — AB (ref <5.7)
Mean Plasma Glucose: 131 mg/dL — ABNORMAL HIGH (ref <117)

## 2013-10-22 LAB — TROPONIN I: Troponin I: <0.3 ng/mL (ref <0.30)

## 2013-10-22 MED ORDER — ENOXAPARIN SODIUM 40 MG/0.4ML ~~LOC~~ SOLN
40.0000 mg | SUBCUTANEOUS | Status: DC
  Administered 2013-10-23: 40 mg via SUBCUTANEOUS
  Filled 2013-10-22: qty 0.4

## 2013-10-22 NOTE — Progress Notes (Signed)
[facility] PROGRESS NOTE  [name] ION:[id number] DOB: 05-06-1928 DOA: 10/21/2013 PCP: Lucretia Kern., DO  Assessment/Plan: 1. Syncope x2 -suspect Arrhthymias given h/o AS-mod to severe and CAD -monitor on Tele -also check Orthostatics -Cards consult eval for Loop Recorder -had 30day monitor in Nov-no arrhythmias noted then  2. H/o CAD, h/o PCI to RCA in 1999 -continue ASA/Plavix/Coreg/statin -negative Stress test 10/14  3. HTN -stable, continue home meds, check Orthostatics  4. H/o CVA -continue ASA/plavix  DVT proph: lovenox  Code Status: Full COde Family Communication: d/w daughter at bedside Disposition Plan: home pending workup   Consultants:  [place]  HPI/Subjective: Feels ok, no chest pain, dyspnea, dizziness  Objective: Filed Vitals:   10/22/13 0543  BP: 135/67  Pulse: 72  Temp: 98.1 F (36.7 C)  Resp: 20    Intake/Output Summary (Last 24 hours) at 10/22/13 1045 Last data filed at 10/22/13 0400  Gross per 24 hour  Intake      0 ml  Output    526 ml  Net   -526 ml   Filed Weights   10/21/13 1458 10/21/13 2347 10/22/13 0543  Weight: 80.74 kg (178 lb) 74.481 kg (164 lb 3.2 oz) 74.526 kg (164 lb 4.8 oz)    Exam:   General:  AAOx3, no distress  Cardiovascular: S1S2/RRR, grade 4/6 ejection systolic murmur  Respiratory: CTAB  Abdomen: soft, Nt, BS present  Musculoskeletal: no edema , BKA  Data Reviewed: Basic Metabolic Panel:  Recent Labs Lab 10/17/13 1644 10/21/13 1545 10/22/13 0401  NA 140 141 143  K 4.4 4.2 4.0  CL 100 100 103  CO2 30 27 28   GLUCOSE 136* 148* 78  BUN 15 19 16   CREATININE 0.71 0.90 0.81  CALCIUM 9.4 9.1 8.8   Liver Function Tests:  Recent Labs Lab 10/17/13 1644  AST 19  ALT 11  ALKPHOS 79  BILITOT 0.2*  PROT 6.7  ALBUMIN 3.4*   No results found for this basename: LIPASE, AMYLASE,  in the last 168 hours No results found for this basename: AMMONIA,  in the last 168  hours CBC:  Recent Labs Lab 10/17/13 1644 10/21/13 1545 10/22/13 0401  WBC 4.3 4.6 3.4*  NEUTROABS 3.1 3.5  --   HGB 10.5* 10.9* 10.1*  HCT 32.1* 34.0* 30.6*  MCV 85.8 85.2 84.8  PLT 159 141* 148*   Cardiac Enzymes:  Recent Labs Lab 10/17/13 1644 10/21/13 1545 10/21/13 2241 10/22/13 0401  TROPONINI <0.30 <0.30 <0.30 <0.30   BNP (last 3 results)  Recent Labs  06/26/13 1606  PROBNP 109.4   CBG:  Recent Labs Lab 10/22/13 0816  GLUCAP 93    No results found for this or any previous visit (from the past 240 hour(s)).   Studies: No results found.  Scheduled Meds: . amLODipine  10 mg Oral Daily  . aspirin EC  81 mg Oral Daily  . carbamazepine  100 mg Oral BID  . carvedilol  12.5 mg Oral Q breakfast  . clopidogrel  75 mg Oral Daily  . docusate sodium  100 mg Oral BID  . [START ON 10/23/2013] enoxaparin (LOVENOX) injection  40 mg Subcutaneous Q24H  . famotidine  20 mg Oral Daily  . [START ON 10/23/2013] hydrochlorothiazide  25 mg Oral Once per day on Mon Thu  . insulin aspart  0-5 Units Subcutaneous QHS  . insulin aspart  0-9 Units Subcutaneous TID WC  . levofloxacin  750 mg Oral Daily  . linagliptin  5  mg Oral Q breakfast  . losartan  100 mg Oral Daily  . metFORMIN  1,000 mg Oral BID WC  . multivitamin with minerals  1 tablet Oral Daily  . nystatin  5 mL Oral QID  . predniSONE  5 mg Oral Daily  . simvastatin  20 mg Oral q1800  . sodium chloride  3 mL Intravenous Q12H  . sodium chloride  3 mL Intravenous Q12H   Continuous Infusions:  Antibiotics Given (last 72 hours)   Date/Time Action Medication Dose   10/22/13 0850 Given   levofloxacin (LEVAQUIN) tablet 750 mg 750 mg      Principal Problem:   Syncope Active Problems:   DIABETES MELLITUS, TYPE II   Hyperlipidemia   Hypertension   Arteriosclerotic cardiovascular disease (ASCVD)   Anemia    Time spent: 68min    Shaolin Armas  [facility] Pager [phone]. If 7PM-7AM, please  contact night-coverage at [email or web address], password [facility] 10/22/2013, 10:45 AM  LOS: 1 day

## 2013-10-22 NOTE — Progress Notes (Signed)
Pt arrived from [facility] without problems.  I have made NPO for probable loop recorder in Am.. EP to see tomorrow.

## 2013-10-22 NOTE — Progress Notes (Signed)
UR completed 

## 2013-10-22 NOTE — Plan of Care (Signed)
Pt being transferred to [facility] to have loop recorder placed.  Pt reports no pain at this time and vitals were stable and charted  just before leaving. Dr. Bronson Ing is sending and Dr. Claiborne Billings is receiving. Pt and family informed of transfer.  Pt will be on Tele during transfer and going to Rm 7 at [facility]. Report given to carelink and Zenia Resides, RN.  IV is staying in place for use at [facility].
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
## 2013-10-23 ENCOUNTER — Encounter (HOSPITAL_COMMUNITY)
Admission: EM | Disposition: A | Discharge status: Home / Self Care | Payer: Self-pay | Source: Home / Self Care | Attending: Internal Medicine

## 2013-10-23 ENCOUNTER — Encounter (HOSPITAL_COMMUNITY): Payer: Self-pay | Admitting: *Deleted

## 2013-10-23 DIAGNOSIS — I359 Nonrheumatic aortic valve disorder, unspecified: Secondary | ICD-10-CM | POA: Diagnosis not present

## 2013-10-23 DIAGNOSIS — R55 Syncope and collapse: Secondary | ICD-10-CM | POA: Diagnosis not present

## 2013-10-23 DIAGNOSIS — E669 Obesity, unspecified: Secondary | ICD-10-CM

## 2013-10-23 DIAGNOSIS — I10 Essential (primary) hypertension: Secondary | ICD-10-CM | POA: Diagnosis not present

## 2013-10-23 HISTORY — PX: LOOP RECORDER IMPLANT: SHX5477

## 2013-10-23 LAB — CBC
HCT: 32.2 % — ABNORMAL LOW (ref 36.0–46.0)
HEMOGLOBIN: 10.4 g/dL — AB (ref 12.0–15.0)
MCH: 27.1 pg (ref 26.0–34.0)
MCHC: 32.3 g/dL (ref 30.0–36.0)
MCV: 83.9 fL (ref 78.0–100.0)
Platelets: 158 10*3/uL (ref 150–400)
RBC: 3.84 MIL/uL — ABNORMAL LOW (ref 3.87–5.11)
RDW: 13.3 % (ref 11.5–15.5)
WBC: 5.7 10*3/uL (ref 4.0–10.5)

## 2013-10-23 LAB — BASIC METABOLIC PANEL
BUN: 15 mg/dL (ref 6–23)
CHLORIDE: 104 meq/L (ref 96–112)
CO2: 27 meq/L (ref 19–32)
CREATININE: 0.88 mg/dL (ref 0.50–1.10)
Calcium: 9.1 mg/dL (ref 8.4–10.5)
GFR calc non Af Amer: 59 mL/min — ABNORMAL LOW (ref >90)
GFR, EST AFRICAN AMERICAN: 69 mL/min — AB (ref >90)
Glucose, Bld: 88 mg/dL (ref 70–99)
POTASSIUM: 4.4 meq/L (ref 3.7–5.3)
SODIUM: 143 meq/L (ref 137–147)

## 2013-10-23 LAB — GLUCOSE, CAPILLARY
GLUCOSE-CAPILLARY: 92 mg/dL (ref 70–99)
Glucose-Capillary: 126 mg/dL — ABNORMAL HIGH (ref 70–99)
Glucose-Capillary: 120 mg/dL — ABNORMAL HIGH (ref 70–99)

## 2013-10-23 LAB — PROTIME-INR
INR: 1.14 (ref 0.00–1.49)
PROTHROMBIN TIME: 14.4 s (ref 11.6–15.2)

## 2013-10-23 SURGERY — LOOP RECORDER IMPLANT
Anesthesia: LOCAL

## 2013-10-23 MED ORDER — LIDOCAINE HCL (PF) 1 % IJ SOLN
INTRAMUSCULAR | Status: AC
  Filled 2013-10-23: qty 30

## 2013-10-23 NOTE — Progress Notes (Signed)
Nutrition Brief Note  Patient identified on the Malnutrition Screening Tool (MST) Report for recent weight lost without trying and eating poorly because of a decreased appetite.  Per readings below, patient has had an 12% weight loss since November 2014; desirable given obesity.   Wt Readings from Last 15 Encounters:  10/23/13 157 lb 13.6 oz (71.6 kg)  10/23/13 157 lb 13.6 oz (71.6 kg)  10/17/13 178 lb (80.74 kg)  09/09/13 173 lb 12 oz (78.812 kg)  07/04/13 178 lb (80.74 kg)  06/27/13 172 lb 9.9 oz (78.3 kg)  06/18/13 160 lb (72.576 kg)  06/03/13 182 lb (82.555 kg)  05/09/13 168 lb 2 oz (76.261 kg)  04/08/13 199 lb (90.266 kg)  03/24/13 197 lb (89.359 kg)  03/11/13 195 lb (88.451 kg)  01/02/13 204 lb (92.534 kg)  12/27/12 204 lb (92.534 kg)  11/14/12 202 lb (91.627 kg)    Body mass index is 32.3 kg/m2 (adjusted for BKA).  Patient meets criteria for Obesity Class I based on current BMI.   Current diet order is Carbohydrate Modified, patient's average consumption is 75% of meals at this time. Labs and medications reviewed.   No nutrition interventions warranted at this time. If nutrition issues arise, please consult RD.   Arthur Holms, RD, LDN Pager #: [phone] After-Hours Pager #: [phone]
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
## 2013-10-23 NOTE — Op Note (Signed)
SURGEON:  Thompson Grayer, MD     PREPROCEDURE DIAGNOSIS:  Recurrent unexplained syncope    POSTPROCEDURE DIAGNOSIS:  Recurrent unexplained syncope     PROCEDURES:   1. Implantable loop recorder implantation    INTRODUCTION:  [name] is a 80 y.o. female with a history of unexplained syncope who presents today for implantable loop implantation.  The patient has had a multiple recent episodes of syncope.  Despite an extensive workup by Dr Harl Bowie, no reversible causes have been identified.  she has worn telemetry during which she did not have arrhythmias or syncope.  There is significant concern for bradycardic arrhythmias as the cause for the patients stroke.  The patient therefore presents today for implantable loop implantation.     DESCRIPTION OF PROCEDURE:  Informed written consent was obtained, and the patient was brought to the electrophysiology lab in a fasting state.  The patient required no sedation for the procedure today.  Mapping over the patient's chest was performed by the EP lab staff to identify the area where electrograms were most prominent for ILR recording.  This area was found to be the left parasternal region over the 3rd-4th intercostal space. The patients left chest was therefore prepped and draped in the usual sterile fashion by the EP lab staff. The skin overlying the left parasternal region was infiltrated with lidocaine for local analgesia.  A 0.5-cm incision was made over the left parasternal region over the 3rd intercostal space.  A subcutaneous ILR pocket was fashioned using a combination of sharp and blunt dissection.  A Medtronic Reveal [place] model [id number] implantable loop recorder was then placed into the pocket  R waves were very prominent and measured 1.34mV.  Steri- Strips and a sterile dressing were then applied.  There were no early apparent complications.     CONCLUSIONS:   1. Successful implantation of a Medtronic Reveal LINQ implantable loop  recorder for recurrent unexplained syncope  2. No early apparent complications.

## 2013-10-23 NOTE — Discharge Instructions (Signed)
° °  Supplemental Discharge Instructions following  Loop Recorder Insertion  NO DRIVING for 6 months until given clearance by Dr. Harl Bowie. WOUND CARE   Keep the wound area clean and dry.  Do not get this area wet for one week. No showers for one week; you may shower on 10/30/2013.   The tape/steri-strips on your wound will fall off; do not pull them off.  No bandage is needed on the site.  DO NOT apply any creams, oils, or ointments to the wound area.   If you notice any drainage or discharge from the wound, any swelling or bruising at the site, or you develop a fever > 101? F after you are discharged home, call the office at once.

## 2013-10-23 NOTE — Progress Notes (Signed)
Echocardiogram 2D Echocardiogram has been performed.  [name] 10/23/2013, 1:02 PM

## 2013-10-23 NOTE — Discharge Summary (Signed)
ELECTROPHYSIOLOGY DISCHARGE SUMMARY   Patient ID: [name],  MRN: [id number], DOB/AGE: [birth year] 70 y.o.  Admit date: 10/21/2013 Discharge date: 10/23/2013  Primary Care Physician: Colin Benton, DO Primary Cardiologist: Carlyle Dolly, MD  Primary Discharge Diagnosis:  1. Recurrent, unexplained syncope s/p ILR insertion  Secondary Discharge Diagnoses:  1. Normal LVEF 2. Moderate aortic stenosis 3. CAD 4. HTN 5. DM 6. Prior CVA  Procedures This Admission:  1. Implantable loop recorder insertion 10/23/2013 Medtronic LINQ device inserted without difficulty. No immediate complication. Please see procedure report for full details.   History:  [name] is a 80 y.o. female with moderate AS, CAD, HTN, DM and prior CVA who presented to [facility] with recurrent syncope. Troponin negative. Head CT negative for any acute abnormality. History of negative Myoview stress test Oct 2014. She was transferred to [facility] for EP evaluation.   [name] is accompanied by her family who are at bedside and assist with history questions. They have witnessed her episodes. [name] describes "just sudden passing out" and states she has no warning or prodrome. She denies CP, SOB, palpitations, dizziness, diaphoresis, nausea or vomiting. She has had intermittent "spells" x 2 years and they seem to be occurring more frequently. She had 2 episodes in November 2014. She wore an event monitor in Dec 2014 but had no symptoms. No arrhythmias. She then had 2 episodes within 4 days which prompted her admission. She and her family report her episodes are all the the same. She is always in seated position. Her family tells me she is "talking one minute and out the next." They tell me she has never called for help or reported symptoms prior to an episode. She "just goes out." She has not injured herself and she denies falls. Her family reports she is unresponsive for 5-6 minutes. She is clammy  but not pale. She appears to be breathing normally. They have checked her BP with a few of these episodes and it is 80/40. No pulse reading on the home BP cuff but daughter tells me her pulse "feels slow." They deny any seizure like activity. [name] reports feeling "really weak" after each episode but no other symptoms. This weakness lasts for several days following an episode. She denies any new or recent changes to her medications. She denies recent illness / fever or chills.   An echo was done revealing normal LVEF and moderate AS. AS is not felt to be cause of syncope. Telemetry while here shows SR without arrhythmia. Work-up to date is negative. She was evaluated by Dr. Rayann Heman who recommended ILR insertion for definitive evaluation for arrhythmogenic cause for her syncope. On 10/23/2013 [name] underwent ILR insertion. She tolerated this procedure well. She has been seen, examined and deemed stable for discharge home today by Dr. Thompson Grayer. She will follow-up in 7-10 days for wound check then will see Dr. Harl Bowie in 6 weeks.   Discharge Vitals: Blood pressure 108/54, pulse 70, temperature 98.5 F (36.9 C), temperature source Oral, resp. rate 18, height 5' (1.524 m), weight 157 lb 13.6 oz (71.6 kg), SpO2 100.00%.   Labs: Lab Results  Component Value Date   WBC 5.7 10/23/2013   HGB 10.4* 10/23/2013   HCT 32.2* 10/23/2013   MCV 83.9 10/23/2013   PLT 158 10/23/2013    Recent Labs Lab 10/17/13 1644  10/23/13 0335  NA 140  < > 143  K 4.4  < > 4.4  CL 100  < >  104  CO2 30  < > 27  BUN 15  < > 15  CREATININE 0.71  < > 0.88  CALCIUM 9.4  < > 9.1  PROT 6.7  --   --   BILITOT 0.2*  --   --   ALKPHOS 79  --   --   ALT 11  --   --   AST 19  --   --   GLUCOSE 136*  < > 88  < > = values in this interval not displayed. Lab Results  Component Value Date   CKTOTAL 49 01/16/2011   CKMB 2.0 01/16/2011   TROPONINI <0.30 10/22/2013     Recent Labs  10/23/13 0335  INR 1.14     Disposition:  The patient is being discharged in stable condition.  Follow-up:     Follow-up Information   Follow up with Lindsay On 11/06/2013. (At 2:30 PM for wound check)    Specialty:  Cardiology   Contact information:   [address] [place] [address] [phone]      Follow up with Arnoldo Lenis, MD On 12/15/2013. (At 2:00 PM)    Specialty:  Cardiology   Contact information:   [address] [place] [address] [phone]      Discharge Medications:    Medication List    STOP taking these medications       levofloxacin 500 MG tablet  Commonly known as:  LEVAQUIN      TAKE these medications       acetaminophen 325 MG tablet  Commonly known as:  TYLENOL  Take 650 mg by mouth every 6 (six) hours as needed. pain     amLODipine 10 MG tablet  Commonly known as:  NORVASC  Take 10 mg by mouth daily.     aspirin EC 81 MG tablet  Take 81 mg by mouth daily.     carbamazepine 100 MG 12 hr tablet  Commonly known as:  TEGRETOL XR  Take 100 mg by mouth 2 (two) times daily.     carvedilol 25 MG tablet  Commonly known as:  COREG  Take 12.5 mg by mouth daily.     clopidogrel 75 MG tablet  Commonly known as:  PLAVIX  Take 1 tablet (75 mg total) by mouth daily.     docusate sodium 100 MG capsule  Commonly known as:  COLACE  Take 100 mg by mouth 2 (two) times daily.     hydrochlorothiazide 25 MG tablet  Commonly known as:  HYDRODIURIL  Take 25 mg by mouth 2 (two) times a week.     losartan 100 MG tablet  Commonly known as:  COZAAR  Take 1 tablet (100 mg total) by mouth daily.     MENTHOL-METHYL SALICYLATE EX  Apply 1 application topically 3 (three) times daily as needed. Body pain     multivitamin tablet  Take 1 tablet by mouth daily.     nystatin 100000 UNIT/ML suspension  Commonly known as:  MYCOSTATIN  Take 5 mLs by mouth 4 (four) times daily.     pravastatin 40 MG tablet  Commonly known as:   PRAVACHOL  Take 40 mg by mouth at bedtime.     predniSONE 5 MG tablet  Commonly known as:  DELTASONE  Take 5 mg by mouth daily.     ranitidine 300 MG tablet  Commonly known as:  ZANTAC  Take 300 mg by  mouth at bedtime.     sitaGLIPtin-metformin 50-1000 MG per tablet  Commonly known as:  JANUMET  Take 1 tablet by mouth 2 (two) times daily with a meal.       Duration of Discharge Encounter: Greater than 30 minutes including physician time.  Manson Passey, PA-C 10/23/2013, 3:40 PM  Thompson Grayer MD
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
## 2013-10-23 NOTE — Interval H&P Note (Signed)
History and Physical Interval Note:  10/23/2013 3:02 PM  [name]  has presented today for surgery, with the diagnosis of syncope  The various methods of treatment have been discussed with the patient and family. After consideration of risks, benefits and other options for treatment, the patient has consented to  Procedure(s): LOOP RECORDER IMPLANT (N/A) as a surgical intervention .  The patient's history has been reviewed, patient examined, no change in status, stable for surgery.  I have reviewed the patient's chart and labs.  Questions were answered to the patient's satisfaction.     Thompson Grayer

## 2013-11-06 ENCOUNTER — Ambulatory Visit: Payer: Medicare Other

## 2013-11-06 ENCOUNTER — Ambulatory Visit (INDEPENDENT_AMBULATORY_CARE_PROVIDER_SITE_OTHER): Payer: Medicare Other | Admitting: *Deleted

## 2013-11-06 DIAGNOSIS — R55 Syncope and collapse: Secondary | ICD-10-CM

## 2013-11-06 LAB — MDC_IDC_ENUM_SESS_TYPE_INCLINIC

## 2013-11-06 NOTE — Progress Notes (Signed)
ILR linq wound check in clinic. Battery status good. Normal device function.1 symptom episode recorded (test). ROV in 3 mths w/JA in [place] office.

## 2013-11-14 ENCOUNTER — Encounter: Payer: Self-pay | Admitting: Family Medicine

## 2013-11-14 ENCOUNTER — Ambulatory Visit (INDEPENDENT_AMBULATORY_CARE_PROVIDER_SITE_OTHER): Payer: Medicare Other | Admitting: Family Medicine

## 2013-11-14 ENCOUNTER — Telehealth: Payer: Self-pay

## 2013-11-14 ENCOUNTER — Telehealth: Payer: Self-pay | Admitting: *Deleted

## 2013-11-14 VITALS — BP 140/64 | Temp 98.8°F | Wt 165.0 lb

## 2013-11-14 DIAGNOSIS — R319 Hematuria, unspecified: Secondary | ICD-10-CM | POA: Diagnosis not present

## 2013-11-14 DIAGNOSIS — I7090 Unspecified atherosclerosis: Secondary | ICD-10-CM | POA: Diagnosis not present

## 2013-11-14 DIAGNOSIS — R310 Gross hematuria: Secondary | ICD-10-CM | POA: Diagnosis not present

## 2013-11-14 DIAGNOSIS — I2510 Atherosclerotic heart disease of native coronary artery without angina pectoris: Secondary | ICD-10-CM | POA: Diagnosis not present

## 2013-11-14 LAB — POCT URINALYSIS DIPSTICK
NITRITE UA: NEGATIVE
PH UA: 8.5
Spec Grav, UA: 1.02
Urobilinogen, UA: 2

## 2013-11-14 LAB — POCT HEMOGLOBIN: Hemoglobin: 10.5 g/dL — AB (ref 12.2–16.2)

## 2013-11-14 MED ORDER — CIPROFLOXACIN HCL 500 MG PO TABS: 500.0000 mg | ORAL_TABLET | Freq: Two times a day (BID) | ORAL | Status: DC

## 2013-11-14 NOTE — Telephone Encounter (Signed)
Alecia from [facility] states pt has hematuria and they want to know what pt needs to do about Plavix and ASA. Pt sees his neurologist on Monday.  Please advise due to MD absence.

## 2013-11-14 NOTE — Progress Notes (Signed)
Pre visit review using our clinic review tool, if applicable. No additional management support is needed unless otherwise documented below in the visit note. 
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
## 2013-11-14 NOTE — Patient Instructions (Signed)
-  As we discussed, we have prescribed a new medication for you at this appointment. We discussed the common and serious potential adverse effects of this medication and you can review these and more with the pharmacist when you pick up your medication.  Please follow the instructions for use carefully and notify us immediately if you have any problems taking this medication.  -keep your appointment with the urologist on Monday and call the on call doctor per the number provided or go to the emergency room if blood clots, worsening bleeding, vomiting, nausea, feeling unwell or other concerns  -per your cardiologist:

## 2013-11-14 NOTE — Telephone Encounter (Signed)
This was sent to me in Dr. Nelly Laurence absence. I do not know the patient, however reviewed her history and Dr. Nelly Laurence most recent note from February. She was reported to be clinically stable from a cardiac perspective with history of CAD status post prior intervention to the RCA in 1999, moderate aortic stenosis, hypertension, and hyperlipidemia. She also has a prior history of stroke. Records indicate that she was seen at [facility] today reporting gross hematuria. There is question of UTI and she has been placed on Cipro, also advised to see urologist on Monday based on review of the notes. She does not have an absolute contraindication to holding Plavix temporarily, and therefore I think it would be reasonable for her to do so until the course and etiology of her hematuria is better understood. I will forward this to Dr. Harl Bowie as well so that he is aware and can make further recommendations going forward.

## 2013-11-14 NOTE — Telephone Encounter (Signed)
Per the cardiologist pt needs to hold plavix temporarily and continue asa.  Pt is to discuss these medications with the urologist on Monday and call the cardiologist to follow up about medicines.  Called and spoke with pt's daughter and she is aware.

## 2013-11-14 NOTE — Telephone Encounter (Signed)
Called Sunday Spillers at [facility] and made her aware of MD recommendations. Will make Dr Harl Bowie aware on Monday when back in office.

## 2013-11-17 ENCOUNTER — Telehealth: Payer: Self-pay | Admitting: Family Medicine

## 2013-11-17 DIAGNOSIS — R310 Gross hematuria: Secondary | ICD-10-CM | POA: Diagnosis not present

## 2013-11-17 DIAGNOSIS — N390 Urinary tract infection, site not specified: Secondary | ICD-10-CM | POA: Diagnosis not present

## 2013-11-17 LAB — URINE CULTURE

## 2013-11-17 MED ORDER — NITROFURANTOIN MONOHYD MACRO 100 MG PO CAPS: 100.0000 mg | ORAL_CAPSULE | Freq: Two times a day (BID) | ORAL | Status: DC

## 2013-11-17 NOTE — Telephone Encounter (Signed)
Daughter called bc pt received call about getting an injection. But they do not know what this is about, and she states pt gets info confuseed sometimes. Would like a cb before 2;30 BC pt has an appt w/ cardiologist at 2pm.  Does pt need to come in before then?

## 2013-11-17 NOTE — Addendum Note (Signed)
Addended by: Colleen Can on: 11/17/2013 11:55 AM   Modules accepted: Orders

## 2013-11-17 NOTE — Telephone Encounter (Signed)
Called and spoke with pt's daughter and she is aware and will discuss injection with urologist today.   Labs faxed over.

## 2013-11-19 ENCOUNTER — Encounter: Payer: Self-pay | Admitting: Internal Medicine

## 2013-11-25 DIAGNOSIS — N390 Urinary tract infection, site not specified: Secondary | ICD-10-CM | POA: Diagnosis not present

## 2013-11-25 DIAGNOSIS — R310 Gross hematuria: Secondary | ICD-10-CM | POA: Diagnosis not present

## 2013-11-28 ENCOUNTER — Ambulatory Visit (INDEPENDENT_AMBULATORY_CARE_PROVIDER_SITE_OTHER): Payer: Medicare Other | Admitting: *Deleted

## 2013-11-28 DIAGNOSIS — R55 Syncope and collapse: Secondary | ICD-10-CM | POA: Diagnosis not present

## 2013-11-28 LAB — MDC_IDC_ENUM_SESS_TYPE_REMOTE: MDC IDC SESS DTM: 20150527040500

## 2013-12-01 ENCOUNTER — Telehealth: Payer: Self-pay | Admitting: *Deleted

## 2013-12-01 MED ORDER — AMLODIPINE BESYLATE 10 MG PO TABS: 10.0000 mg | ORAL_TABLET | Freq: Every day | ORAL | Status: DC

## 2013-12-01 NOTE — Telephone Encounter (Signed)
Refill request completed.

## 2013-12-01 NOTE — Telephone Encounter (Signed)
AMLODIPINE 5MG  #90

## 2013-12-02 ENCOUNTER — Telehealth: Payer: Self-pay | Admitting: Family Medicine

## 2013-12-02 MED ORDER — FLUCONAZOLE 150 MG PO TABS: 150.0000 mg | ORAL_TABLET | Freq: Once | ORAL | Status: DC

## 2013-12-02 NOTE — Telephone Encounter (Signed)
rx for diflucan 150 mg sent into pt's pharmacy for a yeast infection due to antibiotic

## 2013-12-02 NOTE — Telephone Encounter (Signed)
Ok to send diflucan 150mg  once. If persists follow up.

## 2013-12-02 NOTE — Telephone Encounter (Signed)
Pt notified, and med sent into pharmacy

## 2013-12-02 NOTE — Telephone Encounter (Signed)
Pt daughter called and said pt has been taking antibiotic and now has a yeast infection would like to know if Dr Maudie Mercury can call in something to [place]

## 2013-12-02 NOTE — Addendum Note (Signed)
Addended by: Monico Blitz T on: 12/02/2013 11:45 AM   Modules accepted: Orders

## 2013-12-15 ENCOUNTER — Ambulatory Visit (INDEPENDENT_AMBULATORY_CARE_PROVIDER_SITE_OTHER): Payer: Medicare Other | Admitting: Cardiology

## 2013-12-15 VITALS — BP 150/57 | HR 73 | Ht 60.0 in | Wt 164.0 lb

## 2013-12-15 DIAGNOSIS — R55 Syncope and collapse: Secondary | ICD-10-CM | POA: Diagnosis not present

## 2013-12-15 DIAGNOSIS — I350 Nonrheumatic aortic (valve) stenosis: Secondary | ICD-10-CM

## 2013-12-15 DIAGNOSIS — I7090 Unspecified atherosclerosis: Secondary | ICD-10-CM

## 2013-12-15 DIAGNOSIS — E785 Hyperlipidemia, unspecified: Secondary | ICD-10-CM | POA: Diagnosis not present

## 2013-12-15 DIAGNOSIS — I10 Essential (primary) hypertension: Secondary | ICD-10-CM | POA: Diagnosis not present

## 2013-12-15 DIAGNOSIS — I2510 Atherosclerotic heart disease of native coronary artery without angina pectoris: Secondary | ICD-10-CM | POA: Diagnosis not present

## 2013-12-15 DIAGNOSIS — I359 Nonrheumatic aortic valve disorder, unspecified: Secondary | ICD-10-CM

## 2013-12-15 MED ORDER — CARVEDILOL 6.25 MG PO TABS
6.2500 mg | ORAL_TABLET | Freq: Every day | ORAL | Status: DC
Start: 2013-12-15 — End: 2015-03-01

## 2013-12-15 NOTE — Patient Instructions (Signed)
Your physician wants you to follow-up in: 6 months You will receive a reminder letter in the mail two months in advance. If you don't receive a letter, please call our office to schedule the follow-up appointment.     Your physician has recommended you make the following change in your medication:    DECREASE Coreg to 6.25 mg twice a day   Thank you for choosing [facility] !
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
## 2013-12-18 DIAGNOSIS — E1159 Type 2 diabetes mellitus with other circulatory complications: Secondary | ICD-10-CM | POA: Diagnosis not present

## 2013-12-18 DIAGNOSIS — B351 Tinea unguium: Secondary | ICD-10-CM | POA: Diagnosis not present

## 2013-12-18 DIAGNOSIS — L851 Acquired keratosis [keratoderma] palmaris et plantaris: Secondary | ICD-10-CM | POA: Diagnosis not present

## 2013-12-24 ENCOUNTER — Encounter: Payer: Self-pay | Admitting: Internal Medicine

## 2013-12-29 ENCOUNTER — Ambulatory Visit (INDEPENDENT_AMBULATORY_CARE_PROVIDER_SITE_OTHER): Payer: Medicare Other

## 2013-12-29 DIAGNOSIS — R55 Syncope and collapse: Secondary | ICD-10-CM | POA: Diagnosis not present

## 2013-12-29 LAB — MDC_IDC_ENUM_SESS_TYPE_REMOTE
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
## 2014-01-05 DIAGNOSIS — J384 Edema of larynx: Secondary | ICD-10-CM | POA: Diagnosis not present

## 2014-01-05 DIAGNOSIS — R490 Dysphonia: Secondary | ICD-10-CM | POA: Diagnosis not present

## 2014-01-05 DIAGNOSIS — J3801 Paralysis of vocal cords and larynx, unilateral: Secondary | ICD-10-CM | POA: Diagnosis not present

## 2014-01-09 ENCOUNTER — Encounter: Payer: Self-pay | Admitting: *Deleted

## 2014-01-12 DIAGNOSIS — Z961 Presence of intraocular lens: Secondary | ICD-10-CM | POA: Diagnosis not present

## 2014-01-12 DIAGNOSIS — E1139 Type 2 diabetes mellitus with other diabetic ophthalmic complication: Secondary | ICD-10-CM | POA: Diagnosis not present

## 2014-01-12 LAB — HM DIABETES EYE EXAM

## 2014-01-13 ENCOUNTER — Encounter: Payer: Self-pay | Admitting: *Deleted

## 2014-01-29 ENCOUNTER — Ambulatory Visit (INDEPENDENT_AMBULATORY_CARE_PROVIDER_SITE_OTHER): Payer: Medicare Other | Admitting: *Deleted

## 2014-01-29 ENCOUNTER — Encounter: Payer: Medicare Other | Admitting: *Deleted

## 2014-01-29 DIAGNOSIS — R55 Syncope and collapse: Secondary | ICD-10-CM | POA: Diagnosis not present

## 2014-01-31 ENCOUNTER — Other Ambulatory Visit: Payer: Self-pay | Admitting: Family Medicine

## 2014-02-02 ENCOUNTER — Other Ambulatory Visit: Payer: Self-pay | Admitting: Family Medicine

## 2014-02-03 NOTE — Progress Notes (Signed)
Loop recorder 

## 2014-02-11 LAB — MDC_IDC_ENUM_SESS_TYPE_REMOTE

## 2014-02-12 ENCOUNTER — Encounter: Payer: Medicare Other | Admitting: Internal Medicine

## 2014-02-12 NOTE — Telephone Encounter (Signed)
Rx denial called to [facility] at [phone].

## 2014-02-13 ENCOUNTER — Encounter: Payer: Self-pay | Admitting: Internal Medicine

## 2014-02-17 ENCOUNTER — Ambulatory Visit (INDEPENDENT_AMBULATORY_CARE_PROVIDER_SITE_OTHER): Payer: Medicare Other | Admitting: Internal Medicine

## 2014-02-17 ENCOUNTER — Encounter: Payer: Self-pay | Admitting: Internal Medicine

## 2014-02-17 VITALS — BP 160/71 | HR 69 | Ht 60.0 in | Wt 140.0 lb

## 2014-02-17 DIAGNOSIS — R55 Syncope and collapse: Secondary | ICD-10-CM

## 2014-02-17 DIAGNOSIS — I2510 Atherosclerotic heart disease of native coronary artery without angina pectoris: Secondary | ICD-10-CM

## 2014-02-17 DIAGNOSIS — I7090 Unspecified atherosclerosis: Secondary | ICD-10-CM

## 2014-02-17 DIAGNOSIS — I10 Essential (primary) hypertension: Secondary | ICD-10-CM | POA: Diagnosis not present

## 2014-02-17 LAB — MDC_IDC_ENUM_SESS_TYPE_INCLINIC
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
## 2014-02-17 NOTE — Patient Instructions (Signed)
Your physician recommends that you schedule a follow-up appointment as needed with Dr. Rayann Heman. Continue home device checks every 31 days. Your physician recommends that you continue on your current medications as directed. Please refer to the Current Medication list given to you today.

## 2014-02-20 ENCOUNTER — Encounter: Payer: Self-pay | Admitting: Internal Medicine

## 2014-02-26 DIAGNOSIS — B351 Tinea unguium: Secondary | ICD-10-CM | POA: Diagnosis not present

## 2014-02-26 DIAGNOSIS — E1159 Type 2 diabetes mellitus with other circulatory complications: Secondary | ICD-10-CM | POA: Diagnosis not present

## 2014-02-26 DIAGNOSIS — L851 Acquired keratosis [keratoderma] palmaris et plantaris: Secondary | ICD-10-CM | POA: Diagnosis not present

## 2014-02-27 ENCOUNTER — Ambulatory Visit (INDEPENDENT_AMBULATORY_CARE_PROVIDER_SITE_OTHER): Payer: Medicare Other | Admitting: *Deleted

## 2014-02-27 DIAGNOSIS — R55 Syncope and collapse: Secondary | ICD-10-CM | POA: Diagnosis not present

## 2014-02-27 LAB — MDC_IDC_ENUM_SESS_TYPE_REMOTE: Date Time Interrogation Session: 20150729040500

## 2014-03-04 NOTE — Progress Notes (Signed)
Loop recorder 

## 2014-03-17 ENCOUNTER — Encounter: Payer: Self-pay | Admitting: Internal Medicine

## 2014-03-17 ENCOUNTER — Other Ambulatory Visit: Payer: Self-pay | Admitting: Family Medicine

## 2014-03-19 ENCOUNTER — Encounter: Payer: Self-pay | Admitting: Internal Medicine

## 2014-03-30 ENCOUNTER — Ambulatory Visit (INDEPENDENT_AMBULATORY_CARE_PROVIDER_SITE_OTHER): Payer: Medicare Other | Admitting: *Deleted

## 2014-03-30 DIAGNOSIS — R55 Syncope and collapse: Secondary | ICD-10-CM

## 2014-03-30 LAB — MDC_IDC_ENUM_SESS_TYPE_REMOTE

## 2014-04-02 NOTE — Progress Notes (Signed)
Loop recorder 

## 2014-04-14 ENCOUNTER — Encounter: Payer: Self-pay | Admitting: Internal Medicine

## 2014-04-22 ENCOUNTER — Encounter: Payer: Self-pay | Admitting: Internal Medicine

## 2014-04-29 ENCOUNTER — Ambulatory Visit (INDEPENDENT_AMBULATORY_CARE_PROVIDER_SITE_OTHER): Payer: Medicare Other | Admitting: *Deleted

## 2014-04-29 DIAGNOSIS — R55 Syncope and collapse: Secondary | ICD-10-CM

## 2014-04-30 ENCOUNTER — Telehealth: Payer: Self-pay | Admitting: Cardiology

## 2014-04-30 MED ORDER — CLOPIDOGREL BISULFATE 75 MG PO TABS: 75.0000 mg | ORAL_TABLET | Freq: Every day | ORAL | Status: DC

## 2014-04-30 NOTE — Telephone Encounter (Signed)
Refill complete 

## 2014-04-30 NOTE — Telephone Encounter (Signed)
Received fax refill request  Rx # [id number] Medication:  Clopidogrel 75 mg tablet Qty 30 Sig:  Take one tablet by mouth daily Physician:  Harl Bowie

## 2014-05-07 DIAGNOSIS — L84 Corns and callosities: Secondary | ICD-10-CM | POA: Diagnosis not present

## 2014-05-07 DIAGNOSIS — E1151 Type 2 diabetes mellitus with diabetic peripheral angiopathy without gangrene: Secondary | ICD-10-CM | POA: Diagnosis not present

## 2014-05-07 DIAGNOSIS — B351 Tinea unguium: Secondary | ICD-10-CM | POA: Diagnosis not present

## 2014-05-08 NOTE — Progress Notes (Signed)
Loop recorder 

## 2014-05-11 LAB — MDC_IDC_ENUM_SESS_TYPE_REMOTE

## 2014-05-19 ENCOUNTER — Encounter: Payer: Self-pay | Admitting: Internal Medicine

## 2014-05-19 ENCOUNTER — Encounter: Payer: Self-pay | Admitting: Family Medicine

## 2014-05-19 ENCOUNTER — Ambulatory Visit (INDEPENDENT_AMBULATORY_CARE_PROVIDER_SITE_OTHER): Payer: Medicare Other | Admitting: Family Medicine

## 2014-05-19 VITALS — BP 150/70 | HR 72 | Temp 98.0°F | Ht 60.0 in | Wt 158.8 lb

## 2014-05-19 DIAGNOSIS — R630 Anorexia: Secondary | ICD-10-CM

## 2014-05-19 DIAGNOSIS — I2510 Atherosclerotic heart disease of native coronary artery without angina pectoris: Secondary | ICD-10-CM | POA: Diagnosis not present

## 2014-05-19 DIAGNOSIS — E119 Type 2 diabetes mellitus without complications: Secondary | ICD-10-CM | POA: Diagnosis not present

## 2014-05-19 DIAGNOSIS — R634 Abnormal weight loss: Secondary | ICD-10-CM

## 2014-05-19 DIAGNOSIS — Z23 Encounter for immunization: Secondary | ICD-10-CM | POA: Diagnosis not present

## 2014-05-19 DIAGNOSIS — D638 Anemia in other chronic diseases classified elsewhere: Secondary | ICD-10-CM

## 2014-05-19 LAB — COMPLETE METABOLIC PANEL WITH GFR
ALT: 9 U/L (ref 0–35)
AST: 15 U/L (ref 0–37)
Albumin: 4 g/dL (ref 3.5–5.2)
Alkaline Phosphatase: 76 U/L (ref 39–117)
BUN: 17 mg/dL (ref 6–23)
CALCIUM: 9.4 mg/dL (ref 8.4–10.5)
CO2: 27 mEq/L (ref 19–32)
CREATININE: 0.81 mg/dL (ref 0.50–1.10)
Chloride: 103 mEq/L (ref 96–112)
GFR, Est African American: 78 mL/min
GFR, Est Non African American: 67 mL/min
Glucose, Bld: 144 mg/dL — ABNORMAL HIGH (ref 70–99)
Potassium: 4.5 mEq/L (ref 3.5–5.3)
Sodium: 141 mEq/L (ref 135–145)
Total Bilirubin: 0.2 mg/dL (ref 0.2–1.2)
Total Protein: 6.8 g/dL (ref 6.0–8.3)

## 2014-05-19 LAB — CBC WITH DIFFERENTIAL/PLATELET
BASOS ABS: 0 10*3/uL (ref 0.0–0.1)
BASOS PCT: 0.3 % (ref 0.0–3.0)
EOS ABS: 0.1 10*3/uL (ref 0.0–0.7)
Eosinophils Relative: 0.8 % (ref 0.0–5.0)
HCT: 34 % — ABNORMAL LOW (ref 36.0–46.0)
Hemoglobin: 10.7 g/dL — ABNORMAL LOW (ref 12.0–15.0)
LYMPHS PCT: 12 % (ref 12.0–46.0)
Lymphs Abs: 0.8 10*3/uL (ref 0.7–4.0)
MCHC: 31.5 g/dL (ref 30.0–36.0)
MCV: 85.9 fl (ref 78.0–100.0)
Monocytes Absolute: 0.3 10*3/uL (ref 0.1–1.0)
Monocytes Relative: 4.9 % (ref 3.0–12.0)
Neutro Abs: 5.5 10*3/uL (ref 1.4–7.7)
Neutrophils Relative %: 82 % — ABNORMAL HIGH (ref 43.0–77.0)
Platelets: 203 10*3/uL (ref 150.0–400.0)
RBC: 3.95 Mil/uL (ref 3.87–5.11)
RDW: 15 % (ref 11.5–15.5)
WBC: 6.7 10*3/uL (ref 4.0–10.5)

## 2014-05-19 LAB — HEMOGLOBIN A1C: Hgb A1c MFr Bld: 5.9 % (ref 4.6–6.5)

## 2014-05-19 NOTE — Patient Instructions (Signed)
-  We placed a referral for you as discussed. It usually takes about 1-2 weeks to process and schedule this referral. If you have not heard from [place] regarding this appointment in 2 weeks please contact our office.  -We have ordered labs or studies at this visit. It can take up to 1-2 weeks for results and processing. We will contact you with instructions IF your results are abnormal. Normal results will be released to your [facility]. If you have not heard from [place] or can not find your results in [facility] in 2 weeks please contact our office.  -follow up in 1-2 months
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
## 2014-05-19 NOTE — Progress Notes (Signed)
Pre visit review using our clinic review tool, if applicable. No additional management support is needed unless otherwise documented below in the visit note. 

## 2014-05-22 NOTE — Addendum Note (Signed)
Addended by: Agnes Lawrence on: 05/22/2014 03:54 PM   Modules accepted: Orders

## 2014-05-25 ENCOUNTER — Encounter: Payer: Self-pay | Admitting: Gastroenterology

## 2014-05-26 ENCOUNTER — Telehealth: Payer: Self-pay | Admitting: Hematology and Oncology

## 2014-05-26 NOTE — Telephone Encounter (Signed)
S/W PATIENT AND GAVE NP APPT FOR 11/04 @ 9:45 W/DR. [place].

## 2014-05-29 ENCOUNTER — Ambulatory Visit (INDEPENDENT_AMBULATORY_CARE_PROVIDER_SITE_OTHER): Payer: Medicare Other | Admitting: *Deleted

## 2014-05-29 ENCOUNTER — Encounter: Payer: Self-pay | Admitting: Gastroenterology

## 2014-05-29 ENCOUNTER — Ambulatory Visit (INDEPENDENT_AMBULATORY_CARE_PROVIDER_SITE_OTHER): Payer: Medicare Other | Admitting: Gastroenterology

## 2014-05-29 ENCOUNTER — Other Ambulatory Visit (INDEPENDENT_AMBULATORY_CARE_PROVIDER_SITE_OTHER): Payer: Medicare Other

## 2014-05-29 VITALS — BP 122/62 | HR 74 | Ht 60.0 in | Wt 158.0 lb

## 2014-05-29 DIAGNOSIS — R630 Anorexia: Secondary | ICD-10-CM

## 2014-05-29 DIAGNOSIS — R55 Syncope and collapse: Secondary | ICD-10-CM | POA: Diagnosis not present

## 2014-05-29 DIAGNOSIS — R634 Abnormal weight loss: Secondary | ICD-10-CM | POA: Diagnosis not present

## 2014-05-29 DIAGNOSIS — I2510 Atherosclerotic heart disease of native coronary artery without angina pectoris: Secondary | ICD-10-CM

## 2014-05-29 LAB — MDC_IDC_ENUM_SESS_TYPE_REMOTE

## 2014-05-29 LAB — BASIC METABOLIC PANEL
BUN: 17 mg/dL (ref 6–23)
CO2: 24 meq/L (ref 19–32)
CREATININE: 0.8 mg/dL (ref 0.4–1.2)
Calcium: 9.7 mg/dL (ref 8.4–10.5)
Chloride: 105 mEq/L (ref 96–112)
GFR: 91.87 mL/min (ref >60.00)
GLUCOSE: 107 mg/dL — AB (ref 70–99)
Potassium: 4.8 mEq/L (ref 3.5–5.1)
SODIUM: 144 meq/L (ref 135–145)

## 2014-05-29 LAB — IGA: IgA: 239 mg/dL (ref 68–378)
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
## 2014-05-29 NOTE — Patient Instructions (Addendum)
  Your physician has requested that you go to the basement for the following lab work before leaving today:  TTG, IGA, BMET     You have been scheduled for a CT scan of the abdomen and pelvis at [facility] ([address]---this is in the same building as Press [facility]).   You are scheduled on 06/04/2014 at 9:00am. You should arrive 15 minutes prior to your appointment time for registration. Please follow the written instructions below on the day of your exam:  WARNING: IF YOU ARE ALLERGIC TO IODINE/X-RAY DYE, PLEASE NOTIFY RADIOLOGY IMMEDIATELY AT [phone]! YOU WILL BE GIVEN A 13 HOUR PREMEDICATION PREP.  1) Do not eat or drink anything after 5:00am (4 hours prior to your test) 2) You have been given 2 bottles of oral contrast to drink. The solution may taste better if refrigerated, but do NOT add ice or any other liquid to this solution. Shake well before drinking.    Drink 1 bottle of contrast @ 7:00am (2 hours prior to your exam)  Drink 1 bottle of contrast @ 8:00am (1 hour prior to your exam)  You may take any medications as prescribed with a small amount of water except for the following: Metformin, Glucophage, Glucovance, Avandamet, Riomet, Fortamet, Actoplus Met, Janumet, Glumetza or Metaglip. The above medications must be held the day of the exam AND 48 hours after the exam.  The purpose of you drinking the oral contrast is to aid in the visualization of your intestinal tract. The contrast solution may cause some diarrhea. Before your exam is started, you will be given a small amount of fluid to drink. Depending on your individual set of symptoms, you may also receive an intravenous injection of x-ray contrast/dye. Plan on being at [facility] for 30 minutes or long, depending on the type of exam you are having performed.  If you have any questions regarding your exam or if you need to reschedule, you may call the CT department at [phone] between the  hours of 8:00 am and 5:00 pm, Monday-Friday.  ________________________________________________________________________

## 2014-05-31 NOTE — Progress Notes (Signed)
I agree with the note, plan above 

## 2014-06-01 ENCOUNTER — Encounter: Payer: Self-pay | Admitting: Gastroenterology

## 2014-06-02 LAB — TISSUE TRANSGLUTAMINASE, IGA: TISSUE TRANSGLUTAMINASE AB, IGA: 4.1 U/mL (ref <20)

## 2014-06-03 ENCOUNTER — Ambulatory Visit: Payer: Medicare Other

## 2014-06-03 ENCOUNTER — Encounter: Payer: Self-pay | Admitting: Hematology and Oncology

## 2014-06-03 ENCOUNTER — Ambulatory Visit (HOSPITAL_BASED_OUTPATIENT_CLINIC_OR_DEPARTMENT_OTHER): Payer: Medicare Other | Admitting: Hematology and Oncology

## 2014-06-03 VITALS — BP 170/56 | HR 72 | Temp 98.3°F | Resp 19 | Ht 60.0 in | Wt 157.7 lb

## 2014-06-03 DIAGNOSIS — E119 Type 2 diabetes mellitus without complications: Secondary | ICD-10-CM

## 2014-06-03 DIAGNOSIS — D649 Anemia, unspecified: Secondary | ICD-10-CM

## 2014-06-03 DIAGNOSIS — I10 Essential (primary) hypertension: Secondary | ICD-10-CM

## 2014-06-03 DIAGNOSIS — C8590 Non-Hodgkin lymphoma, unspecified, unspecified site: Secondary | ICD-10-CM

## 2014-06-03 DIAGNOSIS — C8190 Hodgkin lymphoma, unspecified, unspecified site: Secondary | ICD-10-CM | POA: Diagnosis not present

## 2014-06-03 DIAGNOSIS — I509 Heart failure, unspecified: Secondary | ICD-10-CM | POA: Diagnosis not present

## 2014-06-03 NOTE — Progress Notes (Signed)
Loop recorder 
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
## 2014-06-03 NOTE — Addendum Note (Signed)
Addended byAlvy Bimler, Sheril Hammond on: 06/03/2014 10:57 AM   Modules accepted: Level of Service

## 2014-06-03 NOTE — Assessment & Plan Note (Signed)
She had prior diagnosis of Hodgkin lymphoma, likely stage IV disease according to family, due to bone marrow involvement. Certainly, this would be the main concern as the cause of her weight loss. CT scan has been ordered by her primary care physician I will follow with the results tomorrow and determine the next course of action. I will obtain old records.

## 2014-06-03 NOTE — Progress Notes (Signed)
Checked in new pt with no financial concerns at this time.  Pt have 2 insurances and is here for a hematology concern so financial assistance may not be needed but she has Raquel's card for any questions or concerns she may have in the future.

## 2014-06-03 NOTE — Assessment & Plan Note (Signed)
This has been present for over 7 years and she is not symptomatic. She would need additional workup but I am planning to defer until I have results of the CT scan. In the meantime, she does not need any transfusions.

## 2014-06-03 NOTE — Progress Notes (Signed)
At the time of dictation, additional old records were available. Additional information is summarized in the problem list

## 2014-06-04 ENCOUNTER — Other Ambulatory Visit: Payer: Self-pay | Admitting: *Deleted

## 2014-06-04 ENCOUNTER — Ambulatory Visit
Admission: RE | Admit: 2014-06-04 | Discharge: 2014-06-04 | Disposition: A | Discharge status: Home / Self Care | Payer: Medicare Other | Source: Ambulatory Visit | Attending: Gastroenterology | Admitting: Gastroenterology

## 2014-06-04 ENCOUNTER — Ambulatory Visit (HOSPITAL_COMMUNITY): Admission: RE | Admit: 2014-06-04 | Payer: Medicare Other | Source: Ambulatory Visit

## 2014-06-04 ENCOUNTER — Ambulatory Visit (INDEPENDENT_AMBULATORY_CARE_PROVIDER_SITE_OTHER)
Admission: RE | Admit: 2014-06-04 | Discharge: 2014-06-04 | Disposition: A | Discharge status: Home / Self Care | Payer: Medicare Other | Source: Ambulatory Visit | Attending: Internal Medicine | Admitting: Internal Medicine

## 2014-06-04 DIAGNOSIS — R634 Abnormal weight loss: Secondary | ICD-10-CM

## 2014-06-04 DIAGNOSIS — C8190 Hodgkin lymphoma, unspecified, unspecified site: Secondary | ICD-10-CM

## 2014-06-04 DIAGNOSIS — N281 Cyst of kidney, acquired: Secondary | ICD-10-CM | POA: Diagnosis not present

## 2014-06-04 DIAGNOSIS — N289 Disorder of kidney and ureter, unspecified: Secondary | ICD-10-CM | POA: Diagnosis not present

## 2014-06-04 DIAGNOSIS — R911 Solitary pulmonary nodule: Secondary | ICD-10-CM | POA: Diagnosis not present

## 2014-06-04 MED ORDER — IOHEXOL 300 MG/ML  SOLN
100.0000 mL | Freq: Once | INTRAMUSCULAR | Status: AC | PRN
  Administered 2014-06-04: 100 mL via INTRAVENOUS

## 2014-06-04 MED ORDER — IOHEXOL 300 MG/ML  SOLN: 100.0000 mL | Freq: Once | INTRAMUSCULAR | Status: AC | PRN

## 2014-06-05 ENCOUNTER — Telehealth: Payer: Self-pay | Admitting: Hematology and Oncology

## 2014-06-05 NOTE — Telephone Encounter (Signed)
I attempted to contact the patient's daughter to review results of CT scan. We'll try again later today.

## 2014-06-05 NOTE — Telephone Encounter (Signed)
I reviewed her records and her most recent CT scan. She does not have signs of lymphoma. There is no cause or explanation for her abnormal weight loss. I recommend she proceed with further GI evaluation. Anemia is likely anemia of chronic disease and has been present for 7 years. I do not feel there would be a strong yield for bone marrow aspirate and biopsy right now. I have attempted to reach her daughter 3 times but unable to reach her. I discussed the result with the patient. Address all her questions. I will touch base with GI for further follow-up.

## 2014-06-08 ENCOUNTER — Telehealth: Payer: Self-pay

## 2014-06-08 NOTE — Telephone Encounter (Signed)
Patient has not been on plavix for any cardiology indication, appears started after prior stroke several years ago. Ok to hold as needed prior to EGD, typically plavix is held 7 days prior to procedures.             Zandra Abts MD    ----- Message -----     From: Barron Alvine, CMA     Sent: 06/08/2014 12:57 PM      To: Arnoldo Lenis, MD                    ----- Message -----     From: Arnoldo Lenis, MD     Sent: 06/08/2014  1:16 PM      To: Bernita Raisin, RN        Please print out letter and forward the following correspondence regarding her plavix            Patient has not been on plavix for any cardiology indication, appears started after prior stroke several years ago. Ok to hold as needed prior to EGD, typically plavix is held 7 days prior to procedures.            Zandra Abts MD    ----- Message -----     From: Barron Alvine, CMA     Sent: 06/08/2014 12:57 PM      To: Arnoldo Lenis, MD

## 2014-06-08 NOTE — Telephone Encounter (Signed)
-----   Message from Bernita Raisin, RN sent at 06/08/2014  2:39 PM EST -----     Patient has not been on plavix for any cardiology indication, appears started after prior stroke several years ago. Ok to hold as needed prior to EGD, typically plavix is held 7 days prior to procedures.         Zandra Abts MD   ----- Message -----    From: Barron Alvine, CMA    Sent: 06/08/2014 12:57 PM     To: Arnoldo Lenis, MD

## 2014-06-08 NOTE — Telephone Encounter (Signed)
Pt aware and will also be informed at previsit

## 2014-06-08 NOTE — Telephone Encounter (Signed)
The pt has been scheduled for EGD and previsit  Anti coag letter sent to Dr Harl Bowie. Pt aware

## 2014-06-08 NOTE — Telephone Encounter (Signed)
Pt aware.

## 2014-06-08 NOTE — Telephone Encounter (Signed)
-----   Message from [name], MD sent at 06/05/2014  3:31 PM EST ----- Regarding: RE: weight loss Dr. Alvy Bimler, Thanks for the note. Will get her in for upper endoscopy.  Lexton Hidalgo, Can you set her up for EGD [place], next available for weight loss. Dyspepsia.  Thanks  ----- Message -----    From: Heath Lark, MD    Sent: 06/05/2014   1:24 PM      To: [name], MD Subject: weight loss                                    Hi,  The patient was referred to see me recently. She had remote history of Hodgkin's lymphoma with chronic anemia. Recent CT is negative for lymphoma recurrence. I have no explanation for her weight loss. I recommend her to proceed with further GI work-up and defer BM biopsy as she is not symptomatic from anemia.  Thanks, Ni

## 2014-06-10 ENCOUNTER — Encounter: Payer: Self-pay | Admitting: Hematology and Oncology

## 2014-06-12 ENCOUNTER — Ambulatory Visit (INDEPENDENT_AMBULATORY_CARE_PROVIDER_SITE_OTHER): Payer: Medicare Other | Admitting: Cardiology

## 2014-06-12 ENCOUNTER — Encounter: Payer: Self-pay | Admitting: Cardiology

## 2014-06-12 VITALS — BP 142/62 | HR 78 | Ht 60.0 in | Wt 158.0 lb

## 2014-06-12 DIAGNOSIS — E785 Hyperlipidemia, unspecified: Secondary | ICD-10-CM | POA: Diagnosis not present

## 2014-06-12 DIAGNOSIS — I2510 Atherosclerotic heart disease of native coronary artery without angina pectoris: Secondary | ICD-10-CM | POA: Diagnosis not present

## 2014-06-12 DIAGNOSIS — I10 Essential (primary) hypertension: Secondary | ICD-10-CM | POA: Diagnosis not present

## 2014-06-12 DIAGNOSIS — R55 Syncope and collapse: Secondary | ICD-10-CM | POA: Diagnosis not present
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
## 2014-06-12 NOTE — Patient Instructions (Signed)
Your physician wants you to follow-up in: 6 months You will receive a reminder letter in the mail two months in advance. If you don't receive a letter, please call our office to schedule the follow-up appointment.     Your physician recommends that you continue on your current medications as directed. Please refer to the Current Medication list given to you today.      Thank you for choosing [facility] !        

## 2014-06-16 ENCOUNTER — Other Ambulatory Visit: Payer: Self-pay | Admitting: Cardiology

## 2014-06-16 MED ORDER — CLOPIDOGREL BISULFATE 75 MG PO TABS: 75.0000 mg | ORAL_TABLET | Freq: Every day | ORAL | Status: DC

## 2014-06-16 NOTE — Telephone Encounter (Signed)
Refill plavix complete

## 2014-06-16 NOTE — Telephone Encounter (Signed)
Please see refill bin / tgs  °

## 2014-06-17 ENCOUNTER — Encounter: Payer: Self-pay | Admitting: Internal Medicine

## 2014-06-29 ENCOUNTER — Ambulatory Visit (INDEPENDENT_AMBULATORY_CARE_PROVIDER_SITE_OTHER): Payer: Medicare Other | Admitting: *Deleted

## 2014-06-29 DIAGNOSIS — R55 Syncope and collapse: Secondary | ICD-10-CM

## 2014-06-29 LAB — MDC_IDC_ENUM_SESS_TYPE_REMOTE: Date Time Interrogation Session: 20151127050500

## 2014-07-03 NOTE — Progress Notes (Signed)
Loop recorder 

## 2014-07-09 ENCOUNTER — Encounter (HOSPITAL_COMMUNITY): Payer: Self-pay | Admitting: Internal Medicine

## 2014-07-16 ENCOUNTER — Ambulatory Visit (INDEPENDENT_AMBULATORY_CARE_PROVIDER_SITE_OTHER): Payer: Medicare Other | Admitting: Family Medicine

## 2014-07-16 ENCOUNTER — Encounter: Payer: Self-pay | Admitting: Family Medicine

## 2014-07-16 VITALS — BP 138/72 | HR 60 | Temp 97.4°F | Ht 60.0 in | Wt 155.7 lb

## 2014-07-16 DIAGNOSIS — I2510 Atherosclerotic heart disease of native coronary artery without angina pectoris: Secondary | ICD-10-CM

## 2014-07-16 DIAGNOSIS — I679 Cerebrovascular disease, unspecified: Secondary | ICD-10-CM | POA: Diagnosis not present

## 2014-07-16 DIAGNOSIS — L84 Corns and callosities: Secondary | ICD-10-CM | POA: Diagnosis not present

## 2014-07-16 DIAGNOSIS — I10 Essential (primary) hypertension: Secondary | ICD-10-CM | POA: Diagnosis not present

## 2014-07-16 DIAGNOSIS — M069 Rheumatoid arthritis, unspecified: Secondary | ICD-10-CM | POA: Diagnosis not present

## 2014-07-16 DIAGNOSIS — R634 Abnormal weight loss: Secondary | ICD-10-CM

## 2014-07-16 DIAGNOSIS — E1151 Type 2 diabetes mellitus with diabetic peripheral angiopathy without gangrene: Secondary | ICD-10-CM | POA: Diagnosis not present

## 2014-07-16 DIAGNOSIS — R55 Syncope and collapse: Secondary | ICD-10-CM

## 2014-07-16 DIAGNOSIS — E1159 Type 2 diabetes mellitus with other circulatory complications: Secondary | ICD-10-CM | POA: Diagnosis not present

## 2014-07-16 DIAGNOSIS — D638 Anemia in other chronic diseases classified elsewhere: Secondary | ICD-10-CM | POA: Diagnosis not present

## 2014-07-16 DIAGNOSIS — B351 Tinea unguium: Secondary | ICD-10-CM | POA: Diagnosis not present

## 2014-07-16 NOTE — Progress Notes (Signed)
Pre visit review using our clinic review tool, if applicable. No additional management support is needed unless otherwise documented below in the visit note. 
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
## 2014-07-17 ENCOUNTER — Telehealth: Payer: Self-pay | Admitting: Family Medicine

## 2014-07-17 NOTE — Telephone Encounter (Signed)
emmi mailed  °

## 2014-07-28 ENCOUNTER — Ambulatory Visit (INDEPENDENT_AMBULATORY_CARE_PROVIDER_SITE_OTHER): Payer: Medicare Other | Admitting: *Deleted

## 2014-07-28 DIAGNOSIS — R55 Syncope and collapse: Secondary | ICD-10-CM | POA: Diagnosis not present

## 2014-07-28 LAB — MDC_IDC_ENUM_SESS_TYPE_REMOTE
MDC IDC SESS DTM: 20151209180530
Zone Setting Detection Interval: 2000 ms
Zone Setting Detection Interval: 3000 ms
Zone Setting Detection Interval: 410 ms

## 2014-07-30 NOTE — Progress Notes (Signed)
Loop recorder 

## 2014-08-03 ENCOUNTER — Encounter: Payer: Medicare Other | Admitting: Gastroenterology

## 2014-08-03 ENCOUNTER — Encounter: Payer: Self-pay | Admitting: Internal Medicine

## 2014-08-24 ENCOUNTER — Telehealth: Payer: Self-pay | Admitting: Family Medicine

## 2014-08-24 MED ORDER — CARBAMAZEPINE ER 100 MG PO TB12: 100.0000 mg | ORAL_TABLET | Freq: Two times a day (BID) | ORAL | Status: DC

## 2014-08-24 NOTE — Telephone Encounter (Signed)
Pt daughter is calling requesting a refill on this med carbamazepine 100 mg #180 call into [facility] the med was prescribed by neurologist who will not refill unless pt makes an appt. The pt family has check into palliative care and the will not pursue at this time

## 2014-08-24 NOTE — Telephone Encounter (Signed)
Sent to pharmacy. I hope she is feeling ok.

## 2014-08-24 NOTE — Telephone Encounter (Signed)
Patients daughter informed

## 2014-08-27 ENCOUNTER — Telehealth: Payer: Self-pay | Admitting: *Deleted

## 2014-08-27 ENCOUNTER — Ambulatory Visit (INDEPENDENT_AMBULATORY_CARE_PROVIDER_SITE_OTHER): Payer: Medicare Other | Admitting: *Deleted

## 2014-08-27 DIAGNOSIS — R55 Syncope and collapse: Secondary | ICD-10-CM

## 2014-08-27 NOTE — Telephone Encounter (Signed)
I called the pts daughter Mrs Kirk Ruths and she stated the pt has been taking the plain Carbamazepine 100mg  chewables twice a day and per Dr Sherren Mocha I called the pharmacy and informed Sepana of this.

## 2014-08-27 NOTE — Telephone Encounter (Signed)
Belenda Cruise called from [facility], [place] [phone]) stating the Rx for Carbamazepine was sent for the XR-pt was taking the plain not XR? Please call to clarify.

## 2014-08-28 ENCOUNTER — Encounter: Payer: Self-pay | Admitting: Internal Medicine

## 2014-08-28 NOTE — Progress Notes (Signed)
Loop recorder 

## 2014-09-09 ENCOUNTER — Telehealth: Payer: Self-pay | Admitting: Family Medicine

## 2014-09-09 ENCOUNTER — Telehealth: Payer: Self-pay

## 2014-09-09 MED ORDER — SITAGLIPTIN PHOS-METFORMIN HCL 50-1000 MG PO TABS: ORAL_TABLET | ORAL | Status: DC

## 2014-09-09 NOTE — Telephone Encounter (Signed)
[facility] refill request for JANUMET 50-1,000MG  TABLET

## 2014-09-09 NOTE — Telephone Encounter (Signed)
Rx done. 

## 2014-09-09 NOTE — Telephone Encounter (Signed)
PA for Janumet was denied.  Patient must try and fail 2 alternatives:  Glimepiride, glipizide, metformin, pioglitazone or glipizide-metformin.

## 2014-09-10 ENCOUNTER — Encounter: Payer: Self-pay | Admitting: Internal Medicine

## 2014-09-10 NOTE — Telephone Encounter (Signed)
Please let daughter know about her insurance refusing to cover janumet and declined prior auth- we could do plain metformin 1000mg  daily instead. With her weight loss - may not need the Janumet. Would likely be much cheaper. Can check blood sugar at her scheduled follow up.  If they are agreeable, DC janumet and order metformin 1000mg  bid. Thanks.

## 2014-09-11 LAB — MDC_IDC_ENUM_SESS_TYPE_REMOTE: MDC IDC SESS DTM: 20160129050500

## 2014-09-11 MED ORDER — METFORMIN HCL 1000 MG PO TABS
1000.0000 mg | ORAL_TABLET | Freq: Two times a day (BID) | ORAL | Status: DC
Start: 2014-09-11 — End: 2014-10-30

## 2014-09-11 NOTE — Telephone Encounter (Signed)
Patient's daughter Mrs Kirk Ruths informed and the new Rx was sent to her pharmacy.

## 2014-09-22 ENCOUNTER — Encounter: Payer: Self-pay | Admitting: Internal Medicine

## 2014-09-24 DIAGNOSIS — E1151 Type 2 diabetes mellitus with diabetic peripheral angiopathy without gangrene: Secondary | ICD-10-CM | POA: Diagnosis not present

## 2014-09-24 DIAGNOSIS — L84 Corns and callosities: Secondary | ICD-10-CM | POA: Diagnosis not present

## 2014-09-24 DIAGNOSIS — B351 Tinea unguium: Secondary | ICD-10-CM | POA: Diagnosis not present

## 2014-09-25 ENCOUNTER — Ambulatory Visit (INDEPENDENT_AMBULATORY_CARE_PROVIDER_SITE_OTHER): Payer: Medicare Other | Admitting: *Deleted

## 2014-09-25 DIAGNOSIS — R55 Syncope and collapse: Secondary | ICD-10-CM

## 2014-09-29 DIAGNOSIS — M1990 Unspecified osteoarthritis, unspecified site: Secondary | ICD-10-CM | POA: Diagnosis not present

## 2014-09-29 DIAGNOSIS — Z7952 Long term (current) use of systemic steroids: Secondary | ICD-10-CM | POA: Diagnosis not present

## 2014-09-29 DIAGNOSIS — M059 Rheumatoid arthritis with rheumatoid factor, unspecified: Secondary | ICD-10-CM | POA: Diagnosis not present

## 2014-09-29 DIAGNOSIS — Z13820 Encounter for screening for osteoporosis: Secondary | ICD-10-CM | POA: Diagnosis not present

## 2014-09-30 ENCOUNTER — Other Ambulatory Visit: Payer: Self-pay | Admitting: Family Medicine

## 2014-10-01 NOTE — Progress Notes (Signed)
Loop recorder 

## 2014-10-12 ENCOUNTER — Ambulatory Visit (INDEPENDENT_AMBULATORY_CARE_PROVIDER_SITE_OTHER): Payer: Medicare Other | Admitting: Family Medicine

## 2014-10-12 ENCOUNTER — Encounter: Payer: Self-pay | Admitting: Family Medicine

## 2014-10-12 VITALS — BP 128/60 | HR 61 | Temp 97.8°F | Ht 60.0 in | Wt 157.6 lb

## 2014-10-12 DIAGNOSIS — I2510 Atherosclerotic heart disease of native coronary artery without angina pectoris: Secondary | ICD-10-CM | POA: Diagnosis not present

## 2014-10-12 DIAGNOSIS — Z23 Encounter for immunization: Secondary | ICD-10-CM

## 2014-10-12 DIAGNOSIS — E785 Hyperlipidemia, unspecified: Secondary | ICD-10-CM

## 2014-10-12 DIAGNOSIS — I10 Essential (primary) hypertension: Secondary | ICD-10-CM

## 2014-10-12 DIAGNOSIS — E1159 Type 2 diabetes mellitus with other circulatory complications: Secondary | ICD-10-CM

## 2014-10-12 DIAGNOSIS — E1151 Type 2 diabetes mellitus with diabetic peripheral angiopathy without gangrene: Secondary | ICD-10-CM

## 2014-10-12 LAB — BASIC METABOLIC PANEL
BUN: 21 mg/dL (ref 6–23)
CO2: 30 mEq/L (ref 19–32)
Calcium: 9.5 mg/dL (ref 8.4–10.5)
Chloride: 105 mEq/L (ref 96–112)
Creatinine, Ser: 0.74 mg/dL (ref 0.40–1.20)
GFR: 96.1 mL/min (ref >60.00)
GLUCOSE: 105 mg/dL — AB (ref 70–99)
POTASSIUM: 4.5 meq/L (ref 3.5–5.1)
SODIUM: 141 meq/L (ref 135–145)

## 2014-10-12 LAB — HEMOGLOBIN A1C: HEMOGLOBIN A1C: 6.2 % (ref 4.6–6.5)

## 2014-10-12 NOTE — Progress Notes (Signed)
Pre visit review using our clinic review tool, if applicable. No additional management support is needed unless otherwise documented below in the visit note. 

## 2014-10-12 NOTE — Patient Instructions (Addendum)
BEFORE YOU LEAVE: -pneumonia vaccine -labs -3 month follow up  Schedule mammogram
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
## 2014-10-12 NOTE — Addendum Note (Signed)
Addended by: Agnes Lawrence on: 10/12/2014 01:40 PM   Modules accepted: Orders

## 2014-10-15 ENCOUNTER — Ambulatory Visit: Payer: Medicare Other | Admitting: Family Medicine

## 2014-10-16 LAB — MDC_IDC_ENUM_SESS_TYPE_REMOTE

## 2014-10-26 ENCOUNTER — Ambulatory Visit (INDEPENDENT_AMBULATORY_CARE_PROVIDER_SITE_OTHER): Payer: Medicare Other | Admitting: *Deleted

## 2014-10-26 DIAGNOSIS — R55 Syncope and collapse: Secondary | ICD-10-CM | POA: Diagnosis not present

## 2014-10-27 LAB — MDC_IDC_ENUM_SESS_TYPE_REMOTE
MDC IDC SESS DTM: 20160420025815
Zone Setting Detection Interval: 2000 ms
Zone Setting Detection Interval: 3000 ms
Zone Setting Detection Interval: 410 ms

## 2014-10-27 NOTE — Progress Notes (Signed)
Loop recorder 

## 2014-10-28 ENCOUNTER — Encounter: Payer: Self-pay | Admitting: Internal Medicine

## 2014-10-30 ENCOUNTER — Other Ambulatory Visit: Payer: Self-pay | Admitting: Family Medicine

## 2014-11-25 ENCOUNTER — Ambulatory Visit (INDEPENDENT_AMBULATORY_CARE_PROVIDER_SITE_OTHER): Payer: Medicare Other | Admitting: *Deleted

## 2014-11-25 DIAGNOSIS — R55 Syncope and collapse: Secondary | ICD-10-CM | POA: Diagnosis not present

## 2014-12-01 NOTE — Progress Notes (Signed)
Loop recorder 

## 2014-12-04 ENCOUNTER — Encounter: Payer: Self-pay | Admitting: Internal Medicine

## 2014-12-07 ENCOUNTER — Encounter: Payer: Self-pay | Admitting: Cardiology

## 2014-12-07 ENCOUNTER — Ambulatory Visit (INDEPENDENT_AMBULATORY_CARE_PROVIDER_SITE_OTHER): Payer: Medicare Other | Admitting: Cardiology

## 2014-12-07 VITALS — BP 138/54 | HR 65 | Ht 60.0 in | Wt 155.2 lb

## 2014-12-07 DIAGNOSIS — E785 Hyperlipidemia, unspecified: Secondary | ICD-10-CM | POA: Diagnosis not present

## 2014-12-07 DIAGNOSIS — R55 Syncope and collapse: Secondary | ICD-10-CM | POA: Diagnosis not present

## 2014-12-07 DIAGNOSIS — I2510 Atherosclerotic heart disease of native coronary artery without angina pectoris: Secondary | ICD-10-CM | POA: Diagnosis not present

## 2014-12-07 DIAGNOSIS — I10 Essential (primary) hypertension: Secondary | ICD-10-CM
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
## 2014-12-07 NOTE — Patient Instructions (Signed)
Your physician wants you to follow-up in: 6 months with Dr Harl Bowie - November 2016 You will receive a reminder letter in the mail two months in advance. If you don't receive a letter, please call our office to schedule the follow-up appointment.    Your physician recommends that you continue on your current medications as directed. Please refer to the Current Medication list given to you today.      Thank you for choosing [facility] !

## 2014-12-17 DIAGNOSIS — E1151 Type 2 diabetes mellitus with diabetic peripheral angiopathy without gangrene: Secondary | ICD-10-CM | POA: Diagnosis not present

## 2014-12-17 DIAGNOSIS — B351 Tinea unguium: Secondary | ICD-10-CM | POA: Diagnosis not present

## 2014-12-17 DIAGNOSIS — L84 Corns and callosities: Secondary | ICD-10-CM | POA: Diagnosis not present

## 2014-12-25 ENCOUNTER — Other Ambulatory Visit: Payer: Self-pay | Admitting: Cardiology

## 2014-12-25 ENCOUNTER — Ambulatory Visit (INDEPENDENT_AMBULATORY_CARE_PROVIDER_SITE_OTHER): Payer: Medicare Other | Admitting: *Deleted

## 2014-12-25 DIAGNOSIS — R55 Syncope and collapse: Secondary | ICD-10-CM

## 2014-12-25 MED ORDER — AMLODIPINE BESYLATE 10 MG PO TABS: 10.0000 mg | ORAL_TABLET | Freq: Every day | ORAL | Status: DC

## 2014-12-25 NOTE — Telephone Encounter (Signed)
Received fax refill request  Rx # [id number] Medication:  Amlodipine Besylate 10 mg tab Qty 90 Sig:  Take one tablet by mouth daily Physician:  Harl Bowie

## 2014-12-25 NOTE — Telephone Encounter (Signed)
Refill complete 

## 2014-12-27 ENCOUNTER — Other Ambulatory Visit: Payer: Self-pay | Admitting: Family Medicine

## 2014-12-29 LAB — CUP PACEART REMOTE DEVICE CHECK: MDC IDC SESS DTM: 20160531140122

## 2015-01-05 DIAGNOSIS — Z1231 Encounter for screening mammogram for malignant neoplasm of breast: Secondary | ICD-10-CM | POA: Diagnosis not present

## 2015-01-05 LAB — CUP PACEART REMOTE DEVICE CHECK: Date Time Interrogation Session: 20160607112136

## 2015-01-05 LAB — HM MAMMOGRAPHY: HM Mammogram: NEGATIVE

## 2015-01-05 NOTE — Progress Notes (Signed)
Loop recorder 

## 2015-01-11 ENCOUNTER — Encounter: Payer: Self-pay | Admitting: Family Medicine

## 2015-01-12 ENCOUNTER — Ambulatory Visit: Payer: Medicare Other | Admitting: Family Medicine

## 2015-01-13 ENCOUNTER — Encounter: Payer: Self-pay | Admitting: Internal Medicine

## 2015-01-18 DIAGNOSIS — E119 Type 2 diabetes mellitus without complications: Secondary | ICD-10-CM | POA: Diagnosis not present

## 2015-01-18 DIAGNOSIS — Z961 Presence of intraocular lens: Secondary | ICD-10-CM | POA: Diagnosis not present

## 2015-01-25 ENCOUNTER — Ambulatory Visit (INDEPENDENT_AMBULATORY_CARE_PROVIDER_SITE_OTHER): Payer: Medicare Other | Admitting: *Deleted

## 2015-01-25 DIAGNOSIS — R55 Syncope and collapse: Secondary | ICD-10-CM

## 2015-01-26 ENCOUNTER — Encounter: Payer: Self-pay | Admitting: Internal Medicine

## 2015-01-26 LAB — CUP PACEART REMOTE DEVICE CHECK: Date Time Interrogation Session: 20160628131033

## 2015-01-27 NOTE — Progress Notes (Signed)
Loop recorder 

## 2015-02-05 ENCOUNTER — Other Ambulatory Visit: Payer: Self-pay | Admitting: Family Medicine

## 2015-02-09 ENCOUNTER — Encounter: Payer: Self-pay | Admitting: Internal Medicine

## 2015-02-10 ENCOUNTER — Encounter: Payer: Self-pay | Admitting: Cardiology

## 2015-02-11 ENCOUNTER — Telehealth: Payer: Self-pay | Admitting: Internal Medicine

## 2015-02-11 NOTE — Telephone Encounter (Signed)
Pt daughter is going to have care giver send manual transmission w/ home monitor.

## 2015-02-11 NOTE — Telephone Encounter (Signed)
New message      1. Has your device fired? Not sure  2. Is you device beeping? Not sure  3. Are you experiencing draining or swelling at device site? No  4. Are you calling to see if we received your device transmission? No  5. Have you passed out? Pt has had 2 episodes today, 1 time she has passed out for 1 minute  Please call to discuss

## 2015-02-11 NOTE — Telephone Encounter (Signed)
Spoke w/family member. Transmission received--no episodes recorded on LINQ.

## 2015-02-15 ENCOUNTER — Ambulatory Visit: Payer: Medicare Other | Admitting: Family Medicine

## 2015-02-16 ENCOUNTER — Ambulatory Visit (INDEPENDENT_AMBULATORY_CARE_PROVIDER_SITE_OTHER): Payer: Medicare Other | Admitting: Family Medicine

## 2015-02-16 ENCOUNTER — Encounter: Payer: Self-pay | Admitting: Family Medicine

## 2015-02-16 ENCOUNTER — Ambulatory Visit: Payer: Medicare Other | Admitting: Family Medicine

## 2015-02-16 VITALS — BP 126/72 | HR 64 | Temp 98.5°F | Ht 60.0 in | Wt 159.5 lb

## 2015-02-16 DIAGNOSIS — I350 Nonrheumatic aortic (valve) stenosis: Secondary | ICD-10-CM

## 2015-02-16 DIAGNOSIS — I10 Essential (primary) hypertension: Secondary | ICD-10-CM

## 2015-02-16 DIAGNOSIS — E1151 Type 2 diabetes mellitus with diabetic peripheral angiopathy without gangrene: Secondary | ICD-10-CM

## 2015-02-16 DIAGNOSIS — E1159 Type 2 diabetes mellitus with other circulatory complications: Secondary | ICD-10-CM

## 2015-02-16 DIAGNOSIS — E669 Obesity, unspecified: Secondary | ICD-10-CM | POA: Diagnosis not present

## 2015-02-16 DIAGNOSIS — R55 Syncope and collapse: Secondary | ICD-10-CM | POA: Diagnosis not present

## 2015-02-16 DIAGNOSIS — M069 Rheumatoid arthritis, unspecified: Secondary | ICD-10-CM

## 2015-02-16 DIAGNOSIS — I2510 Atherosclerotic heart disease of native coronary artery without angina pectoris: Secondary | ICD-10-CM | POA: Diagnosis not present

## 2015-02-16 DIAGNOSIS — Z89511 Acquired absence of right leg below knee: Secondary | ICD-10-CM

## 2015-02-16 DIAGNOSIS — I679 Cerebrovascular disease, unspecified: Secondary | ICD-10-CM

## 2015-02-16 DIAGNOSIS — E785 Hyperlipidemia, unspecified: Secondary | ICD-10-CM

## 2015-02-16 NOTE — Progress Notes (Signed)
Pre visit review using our clinic review tool, if applicable. No additional management support is needed unless otherwise documented below in the visit note. 
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
## 2015-02-16 NOTE — Patient Instructions (Signed)
BEFORE YOU LEAVE: -[name] please obtain diabetic eye exam from her optho -follow up for Medicare Wellness Exam in 3-4 months, come fasting but drink plenty of water

## 2015-02-19 ENCOUNTER — Ambulatory Visit: Payer: Medicare Other | Admitting: Family Medicine

## 2015-02-24 ENCOUNTER — Ambulatory Visit (INDEPENDENT_AMBULATORY_CARE_PROVIDER_SITE_OTHER): Payer: Medicare Other

## 2015-02-24 DIAGNOSIS — R55 Syncope and collapse: Secondary | ICD-10-CM

## 2015-02-25 ENCOUNTER — Other Ambulatory Visit: Payer: Self-pay

## 2015-02-25 DIAGNOSIS — E1151 Type 2 diabetes mellitus with diabetic peripheral angiopathy without gangrene: Secondary | ICD-10-CM | POA: Diagnosis not present

## 2015-02-25 DIAGNOSIS — L84 Corns and callosities: Secondary | ICD-10-CM | POA: Diagnosis not present

## 2015-02-25 DIAGNOSIS — B351 Tinea unguium: Secondary | ICD-10-CM | POA: Diagnosis not present

## 2015-02-25 MED ORDER — CLOPIDOGREL BISULFATE 75 MG PO TABS: 75.0000 mg | ORAL_TABLET | Freq: Every day | ORAL | Status: DC

## 2015-03-01 ENCOUNTER — Encounter: Payer: Self-pay | Admitting: Internal Medicine

## 2015-03-01 ENCOUNTER — Ambulatory Visit (INDEPENDENT_AMBULATORY_CARE_PROVIDER_SITE_OTHER): Payer: Medicare Other | Admitting: Internal Medicine

## 2015-03-01 VITALS — BP 152/70 | HR 58 | Ht 60.0 in | Wt 157.4 lb

## 2015-03-01 DIAGNOSIS — I10 Essential (primary) hypertension: Secondary | ICD-10-CM

## 2015-03-01 DIAGNOSIS — I2510 Atherosclerotic heart disease of native coronary artery without angina pectoris: Secondary | ICD-10-CM | POA: Diagnosis not present

## 2015-03-01 DIAGNOSIS — R55 Syncope and collapse: Secondary | ICD-10-CM | POA: Diagnosis not present

## 2015-03-01 DIAGNOSIS — I350 Nonrheumatic aortic (valve) stenosis: Secondary | ICD-10-CM | POA: Diagnosis not present

## 2015-03-01 LAB — CBC WITH DIFFERENTIAL/PLATELET
BASOS PCT: 0.3 % (ref 0.0–3.0)
Basophils Absolute: 0 10*3/uL (ref 0.0–0.1)
Eosinophils Absolute: 0.1 10*3/uL (ref 0.0–0.7)
Eosinophils Relative: 1.6 % (ref 0.0–5.0)
HCT: 32 % — ABNORMAL LOW (ref 36.0–46.0)
Hemoglobin: 10.4 g/dL — ABNORMAL LOW (ref 12.0–15.0)
LYMPHS ABS: 1.1 10*3/uL (ref 0.7–4.0)
Lymphocytes Relative: 20.4 % (ref 12.0–46.0)
MCHC: 32.4 g/dL (ref 30.0–36.0)
MCV: 84 fl (ref 78.0–100.0)
MONO ABS: 0.3 10*3/uL (ref 0.1–1.0)
MONOS PCT: 6.7 % (ref 3.0–12.0)
NEUTROS PCT: 71 % (ref 43.0–77.0)
Neutro Abs: 3.7 10*3/uL (ref 1.4–7.7)
Platelets: 181 10*3/uL (ref 150.0–400.0)
RBC: 3.81 Mil/uL — ABNORMAL LOW (ref 3.87–5.11)
RDW: 14.3 % (ref 11.5–15.5)
WBC: 5.2 10*3/uL (ref 4.0–10.5)

## 2015-03-01 LAB — CUP PACEART INCLINIC DEVICE CHECK
Date Time Interrogation Session: 20160801132432
MDC IDC SET ZONE DETECTION INTERVAL: 2000 ms
MDC IDC SET ZONE DETECTION INTERVAL: 3000 ms
MDC IDC SET ZONE DETECTION INTERVAL: 410 ms

## 2015-03-01 LAB — BASIC METABOLIC PANEL
BUN: 16 mg/dL (ref 6–23)
CALCIUM: 9.3 mg/dL (ref 8.4–10.5)
CO2: 28 mEq/L (ref 19–32)
Chloride: 104 mEq/L (ref 96–112)
Creatinine, Ser: 0.77 mg/dL (ref 0.40–1.20)
GFR: 91.71 mL/min (ref >60.00)
Glucose, Bld: 91 mg/dL (ref 70–99)
Potassium: 4.3 mEq/L (ref 3.5–5.1)
SODIUM: 142 meq/L (ref 135–145)

## 2015-03-01 NOTE — Patient Instructions (Addendum)
Medication Instructions:  Your physician recommends that you continue on your current medications as directed. Please refer to the Current Medication list given to you today.   Labwork: Your physician recommends that you return for lab work today: BMP/CBC   Testing/Procedures: Your physician has requested that you have an echocardiogram. Echocardiography is a painless test that uses sound waves to create images of your heart. It provides your doctor with information about the size and shape of your heart and how well your heart's chambers and valves are working. This procedure takes approximately one hour. There are no restrictions for this procedure.    Follow-Up:  Your physician recommends that you schedule a follow-up appointment in: 4 months with Dr Harl Bowie .  Also follow up with Neurologist  Dr Rayann Heman as needed    Any Other Special Instructions Will Be Listed Below (If Applicable).

## 2015-03-02 ENCOUNTER — Telehealth: Payer: Self-pay | Admitting: Family Medicine

## 2015-03-02 ENCOUNTER — Other Ambulatory Visit: Payer: Self-pay | Admitting: Family Medicine

## 2015-03-02 NOTE — Telephone Encounter (Signed)
Pt needs rxs for assurance size large(pull ups) and assurance bed pads 30 x 36  1 pk each w/refills. Please fax to Shaune Leeks at [employer] [phone]

## 2015-03-03 NOTE — Progress Notes (Signed)
Loop recorder 
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
## 2015-03-08 ENCOUNTER — Other Ambulatory Visit: Payer: Self-pay

## 2015-03-08 ENCOUNTER — Ambulatory Visit (HOSPITAL_COMMUNITY): Payer: Medicare Other | Attending: Cardiology

## 2015-03-08 DIAGNOSIS — R55 Syncope and collapse: Secondary | ICD-10-CM

## 2015-03-08 DIAGNOSIS — I071 Rheumatic tricuspid insufficiency: Secondary | ICD-10-CM | POA: Insufficient documentation

## 2015-03-08 DIAGNOSIS — E119 Type 2 diabetes mellitus without complications: Secondary | ICD-10-CM | POA: Insufficient documentation

## 2015-03-08 DIAGNOSIS — I351 Nonrheumatic aortic (valve) insufficiency: Secondary | ICD-10-CM | POA: Insufficient documentation

## 2015-03-08 DIAGNOSIS — I340 Nonrheumatic mitral (valve) insufficiency: Secondary | ICD-10-CM | POA: Diagnosis not present

## 2015-03-08 DIAGNOSIS — I10 Essential (primary) hypertension: Secondary | ICD-10-CM | POA: Diagnosis not present

## 2015-03-12 LAB — CUP PACEART REMOTE DEVICE CHECK: Date Time Interrogation Session: 20160812144625

## 2015-03-12 NOTE — Telephone Encounter (Signed)
ok 

## 2015-03-12 NOTE — Telephone Encounter (Signed)
Okay to give. 

## 2015-03-16 NOTE — Telephone Encounter (Signed)
Order completed and faxed to [facility] at [phone] and I called Alisha and left a detailed message with this information.  I also called the pts daughter and informed her of this.

## 2015-03-18 ENCOUNTER — Telehealth: Payer: Self-pay | Admitting: Internal Medicine

## 2015-03-18 NOTE — Telephone Encounter (Signed)
Pt's daughter called:   She requested and ref. To [facility] [place] based on the pt's last apt discussion.

## 2015-03-19 ENCOUNTER — Encounter: Payer: Self-pay | Admitting: Internal Medicine

## 2015-03-19 IMAGING — NM NM MYOCAR SINGLE W/SPECT W/WALL MOTION & EF
2 series · 12 of 12 positions shown · non-contrast
Comparison: None.

CLINICAL DATA: 84-year-old woman with history of CAD status post
prior intervention to the RCA, aortic stenosis, hypertension, and
hyperlipidemia. The study is requested to evaluate for the presence
of ischemia.

EXAM:
MYOCARDIAL IMAGING WITH SPECT (REST AND PHARMACOLOGIC-STRESS)
GATED LEFT VENTRICULAR WALL MOTION STUDY
LEFT VENTRICULAR EJECTION FRACTION
TECHNIQUE: Standard myocardial SPECT imaging was performed after resting
intravenous injection of 10 mCi 0c-NNm sestamibi Subsequently,
intravenous infusion of regadenoson was performed under the
supervision of the Cardiology staff. At peak effect of the drug, 30
mCi 0c-NNm sestamibi was injected intravenously and standard
myocardial SPECT imaging was performed. Quantitative gated imaging
was also performed to evaluate left ventricular wall motion, and
estimate left ventricular ejection fraction.

[cardiac rest stress · 6.39mm/px · 6 of 512 frames shown (1 of 2)]
[frame 43/512]
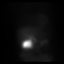
[frame 128/512]
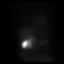
[frame 214/512]
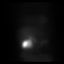
[frame 299/512]
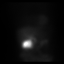
[frame 384/512]
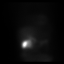
[frame 470/512]
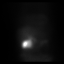

[cardiac rest stress · 6.39mm/px · 6 of 64 frames shown (2 of 2)]
[frame 6/64]
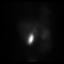
[frame 16/64]
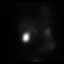
[frame 27/64]
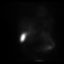
[frame 38/64]
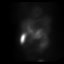
[frame 48/64]
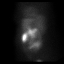
[frame 59/64]
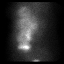

[12 of 12 positions shown; findings below may reference images not displayed]

FINDINGS: Baseline ECG shows sinus rhythm with nonspecific ST changes.
Regadenoson infusion was given in standard fashion. Heart rate
increased from 63 beats per min up to 90 beats per min, and blood
pressure increased from 147/62 up to 158/67. No clearly diagnostic
ST segment changes were noted, and there were no arrhythmias.
Patient tolerated infusion well.

Analysis of the overall perfusion data findings breast attenuation
artifact.

Tomographic views were obtained using the short axis, vertical long
axis, and horizontal long axis planes. There are no significant
perfusion defects to indicate scar or ischemia with mild breast
attenuation noted.

Gated imaging reveals an EDV of 50, ESV of 11, TID ratio of 0.83,
and LVEF of 78% without wall motion abnormality.
IMPRESSION: Low risk regadenoson Cardiolite as outlined. There is evidence of
breast attenuation without active ischemia or scar. LVEF 78% with
normal volumes.

## 2015-03-22 ENCOUNTER — Other Ambulatory Visit: Payer: Self-pay | Admitting: *Deleted

## 2015-03-22 DIAGNOSIS — R55 Syncope and collapse: Secondary | ICD-10-CM

## 2015-03-22 NOTE — Telephone Encounter (Signed)
Lenna Sciara is referring to [facility]

## 2015-03-23 DIAGNOSIS — J3801 Paralysis of vocal cords and larynx, unilateral: Secondary | ICD-10-CM | POA: Diagnosis not present

## 2015-03-23 DIAGNOSIS — B370 Candidal stomatitis: Secondary | ICD-10-CM | POA: Diagnosis not present

## 2015-03-23 DIAGNOSIS — K1120 Sialoadenitis, unspecified: Secondary | ICD-10-CM | POA: Diagnosis not present

## 2015-03-26 ENCOUNTER — Ambulatory Visit (INDEPENDENT_AMBULATORY_CARE_PROVIDER_SITE_OTHER): Payer: Medicare Other | Admitting: *Deleted

## 2015-03-26 DIAGNOSIS — R55 Syncope and collapse: Secondary | ICD-10-CM

## 2015-03-31 ENCOUNTER — Other Ambulatory Visit: Payer: Self-pay | Admitting: Family Medicine

## 2015-03-31 NOTE — Progress Notes (Signed)
Loop recorder 

## 2015-04-02 ENCOUNTER — Telehealth: Payer: Self-pay | Admitting: *Deleted

## 2015-04-02 NOTE — Telephone Encounter (Signed)
Patient's daughter Deirdre called and left a message stating the pt needs a referral to have the "light" in her intestine.  I called the pt and spoke with Deirdre and she stated the pt was to have this done last year but did not want to do this at the time but now she does.  She was given the phone number to call [facility] to schedule this and she requested an appt on 9/12 and I scheduled an appt with Dr Maudie Mercury at 1pm.

## 2015-04-09 LAB — CUP PACEART REMOTE DEVICE CHECK: MDC IDC SESS DTM: 20160909110631

## 2015-04-09 NOTE — Progress Notes (Signed)
[facility] summary report received. Battery status OK. Normal device function. No new symptom episodes, tachy episodes, brady, or pause episodes. No new AF episodes. Monthly summary reports and ROV with JA PRN. 

## 2015-04-12 ENCOUNTER — Ambulatory Visit (INDEPENDENT_AMBULATORY_CARE_PROVIDER_SITE_OTHER): Payer: Medicare Other | Admitting: Family Medicine

## 2015-04-12 ENCOUNTER — Ambulatory Visit (INDEPENDENT_AMBULATORY_CARE_PROVIDER_SITE_OTHER): Payer: Medicare Other | Admitting: Neurology

## 2015-04-12 ENCOUNTER — Encounter: Payer: Self-pay | Admitting: Neurology

## 2015-04-12 ENCOUNTER — Telehealth: Payer: Self-pay | Admitting: *Deleted

## 2015-04-12 ENCOUNTER — Encounter: Payer: Self-pay | Admitting: Family Medicine

## 2015-04-12 VITALS — BP 122/60 | HR 63 | Temp 98.2°F | Ht 60.0 in

## 2015-04-12 VITALS — BP 190/80 | HR 66 | Ht 60.0 in

## 2015-04-12 DIAGNOSIS — I679 Cerebrovascular disease, unspecified: Secondary | ICD-10-CM

## 2015-04-12 DIAGNOSIS — C8190 Hodgkin lymphoma, unspecified, unspecified site: Secondary | ICD-10-CM

## 2015-04-12 DIAGNOSIS — R569 Unspecified convulsions: Secondary | ICD-10-CM

## 2015-04-12 DIAGNOSIS — M069 Rheumatoid arthritis, unspecified: Secondary | ICD-10-CM

## 2015-04-12 DIAGNOSIS — R5382 Chronic fatigue, unspecified: Secondary | ICD-10-CM | POA: Diagnosis not present

## 2015-04-12 DIAGNOSIS — I2510 Atherosclerotic heart disease of native coronary artery without angina pectoris: Secondary | ICD-10-CM | POA: Diagnosis not present

## 2015-04-12 DIAGNOSIS — Z23 Encounter for immunization: Secondary | ICD-10-CM

## 2015-04-12 DIAGNOSIS — E669 Obesity, unspecified: Secondary | ICD-10-CM

## 2015-04-12 DIAGNOSIS — E538 Deficiency of other specified B group vitamins: Secondary | ICD-10-CM | POA: Diagnosis not present

## 2015-04-12 DIAGNOSIS — E1159 Type 2 diabetes mellitus with other circulatory complications: Secondary | ICD-10-CM | POA: Diagnosis not present

## 2015-04-12 DIAGNOSIS — E1151 Type 2 diabetes mellitus with diabetic peripheral angiopathy without gangrene: Secondary | ICD-10-CM

## 2015-04-12 DIAGNOSIS — R413 Other amnesia: Secondary | ICD-10-CM

## 2015-04-12 DIAGNOSIS — R55 Syncope and collapse: Secondary | ICD-10-CM | POA: Diagnosis not present

## 2015-04-12 LAB — HEMOGLOBIN A1C: HEMOGLOBIN A1C: 6.2 % (ref 4.6–6.5)
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
## 2015-04-12 NOTE — Patient Instructions (Signed)
Overall you are doing fairly well but I do want to suggest a few things today:   Remember to drink plenty of fluid, eat healthy meals and do not skip any meals. Try to eat protein with a every meal and eat a healthy snack such as fruit or nuts in between meals. Try to keep a regular sleep-wake schedule and try to exercise daily, particularly in the form of walking, 20-30 minutes a day, if you can.   As far as diagnostic testing: EEG, labwork  I would like to see you back in 3 months, sooner if we need to. Please call us with any interim questions, concerns, problems, updates or refill requests.   Our phone number is [phone]. We also have an after hours call service for urgent matters and there is a physician on-call for urgent questions. For any emergencies you know to call 911 or go to the nearest emergency room

## 2015-04-12 NOTE — Patient Instructions (Signed)
BEFORE YOU LEAVE: -flu shot -labs -follow up in 3-4 months, we may consider changing your diabetes medication  Schedule follow up with your gastroenterologist: [facility]  Address: [address] [place], [place], [place] [address]  [phone]

## 2015-04-12 NOTE — Progress Notes (Signed)
Pre visit review using our clinic review tool, if applicable. No additional management support is needed unless otherwise documented below in the visit note. 

## 2015-04-12 NOTE — Telephone Encounter (Signed)
Dr Jaynee Eagles requested records from Dr Marcelle Overlie, at [facility] faxed release 04/12/15.
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
## 2015-04-14 ENCOUNTER — Telehealth: Payer: Self-pay

## 2015-04-14 LAB — B12 AND FOLATE PANEL: VITAMIN B 12: 444 pg/mL (ref 211–946)

## 2015-04-14 LAB — METHYLMALONIC ACID, SERUM: Methylmalonic Acid: 126 nmol/L (ref 0–378)

## 2015-04-14 LAB — CARBAMAZEPINE, FREE AND TOTAL
CARBAMAZEPINE FREE: 0.9 ug/mL (ref 0.6–4.2)
CARBAMAZEPINE, TOTAL: 3.9 ug/mL — AB (ref 4.0–12.0)

## 2015-04-14 LAB — TSH: TSH: 1.22 u[IU]/mL (ref 0.450–4.500)

## 2015-04-14 NOTE — Telephone Encounter (Signed)
-----   Message from Melvenia Beam, MD sent at 04/14/2015  1:12 PM EDT ----- Let patient know her labs were fine thanks

## 2015-04-14 NOTE — Telephone Encounter (Signed)
Called pt and informed her that her labs were fine according to Dr. Jaynee Eagles. Pt verbalized understanding to call with any questions or concerns.

## 2015-04-16 IMAGING — CR DG CHEST 2V
2 series · 2 of 2 positions shown · non-contrast
Comparison: 01/30/2011

CLINICAL DATA: Loss of consciousness, history of CHF and
hypertension

EXAM:
CHEST  2 VIEW

[view not recorded (1 of 2)]
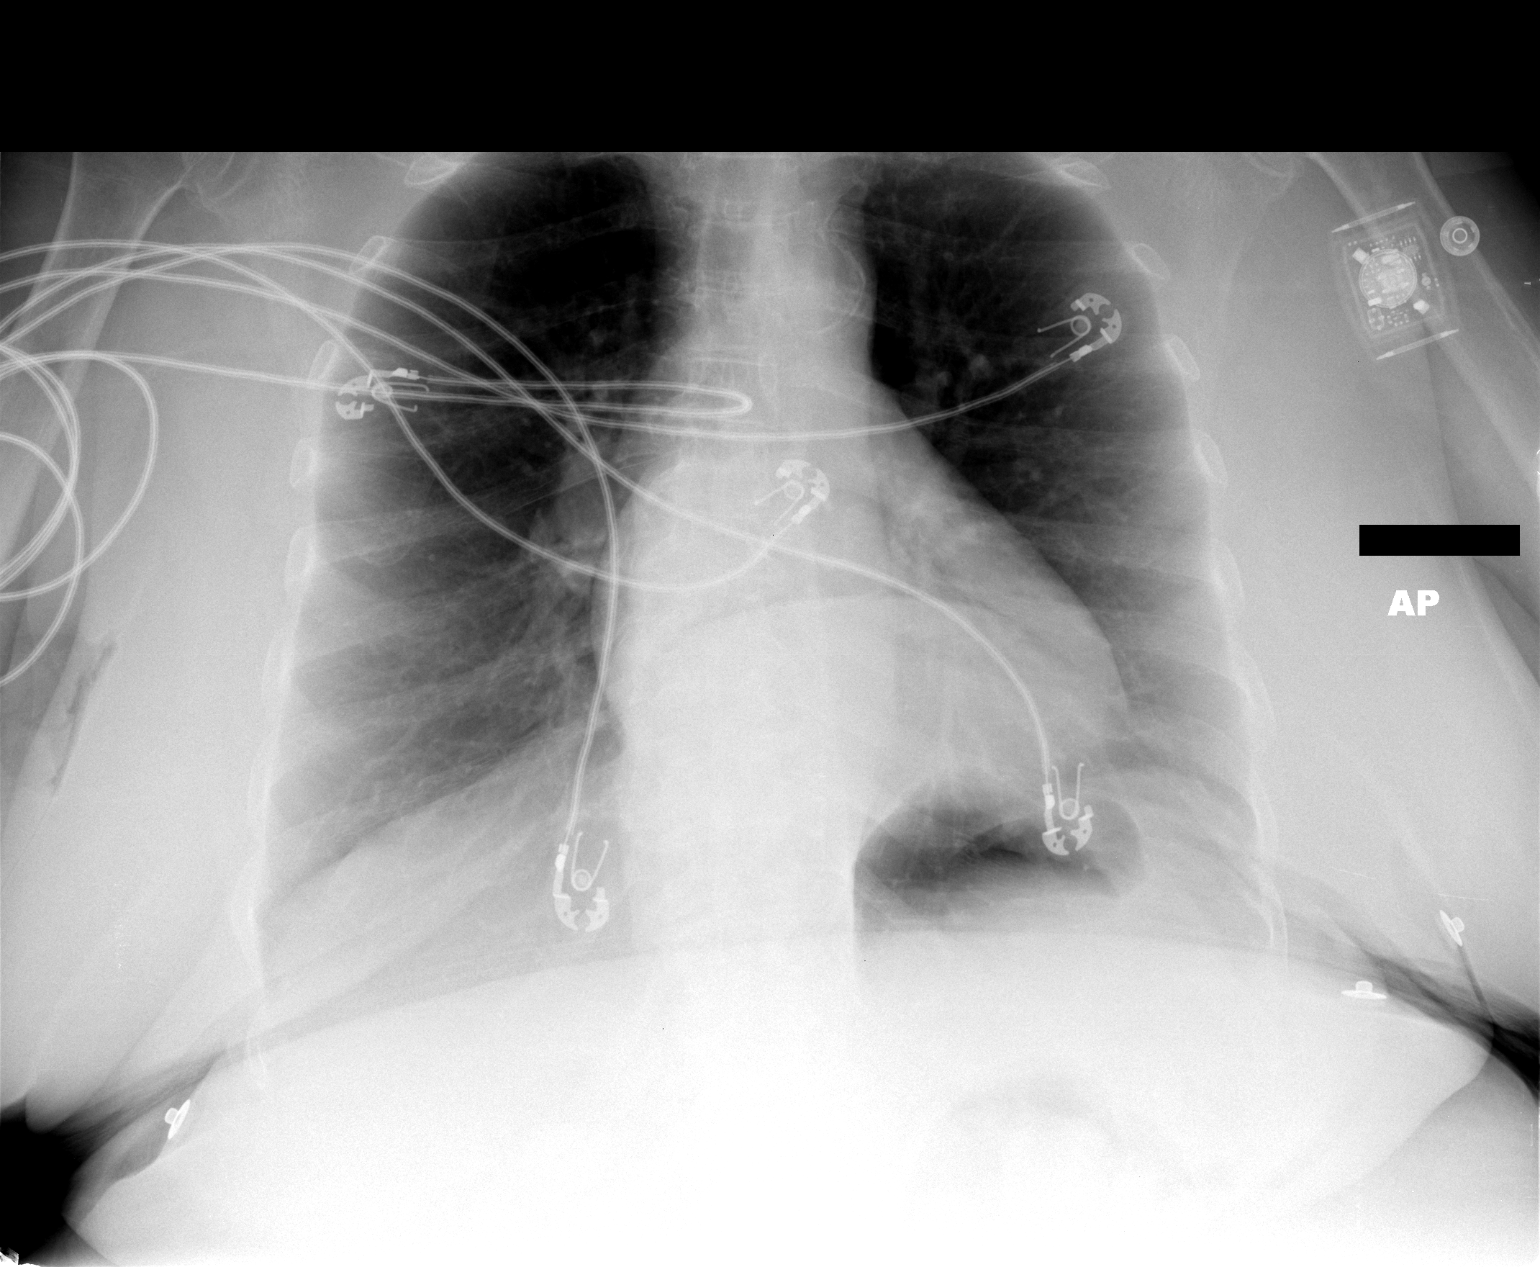

[view not recorded (2 of 2)]
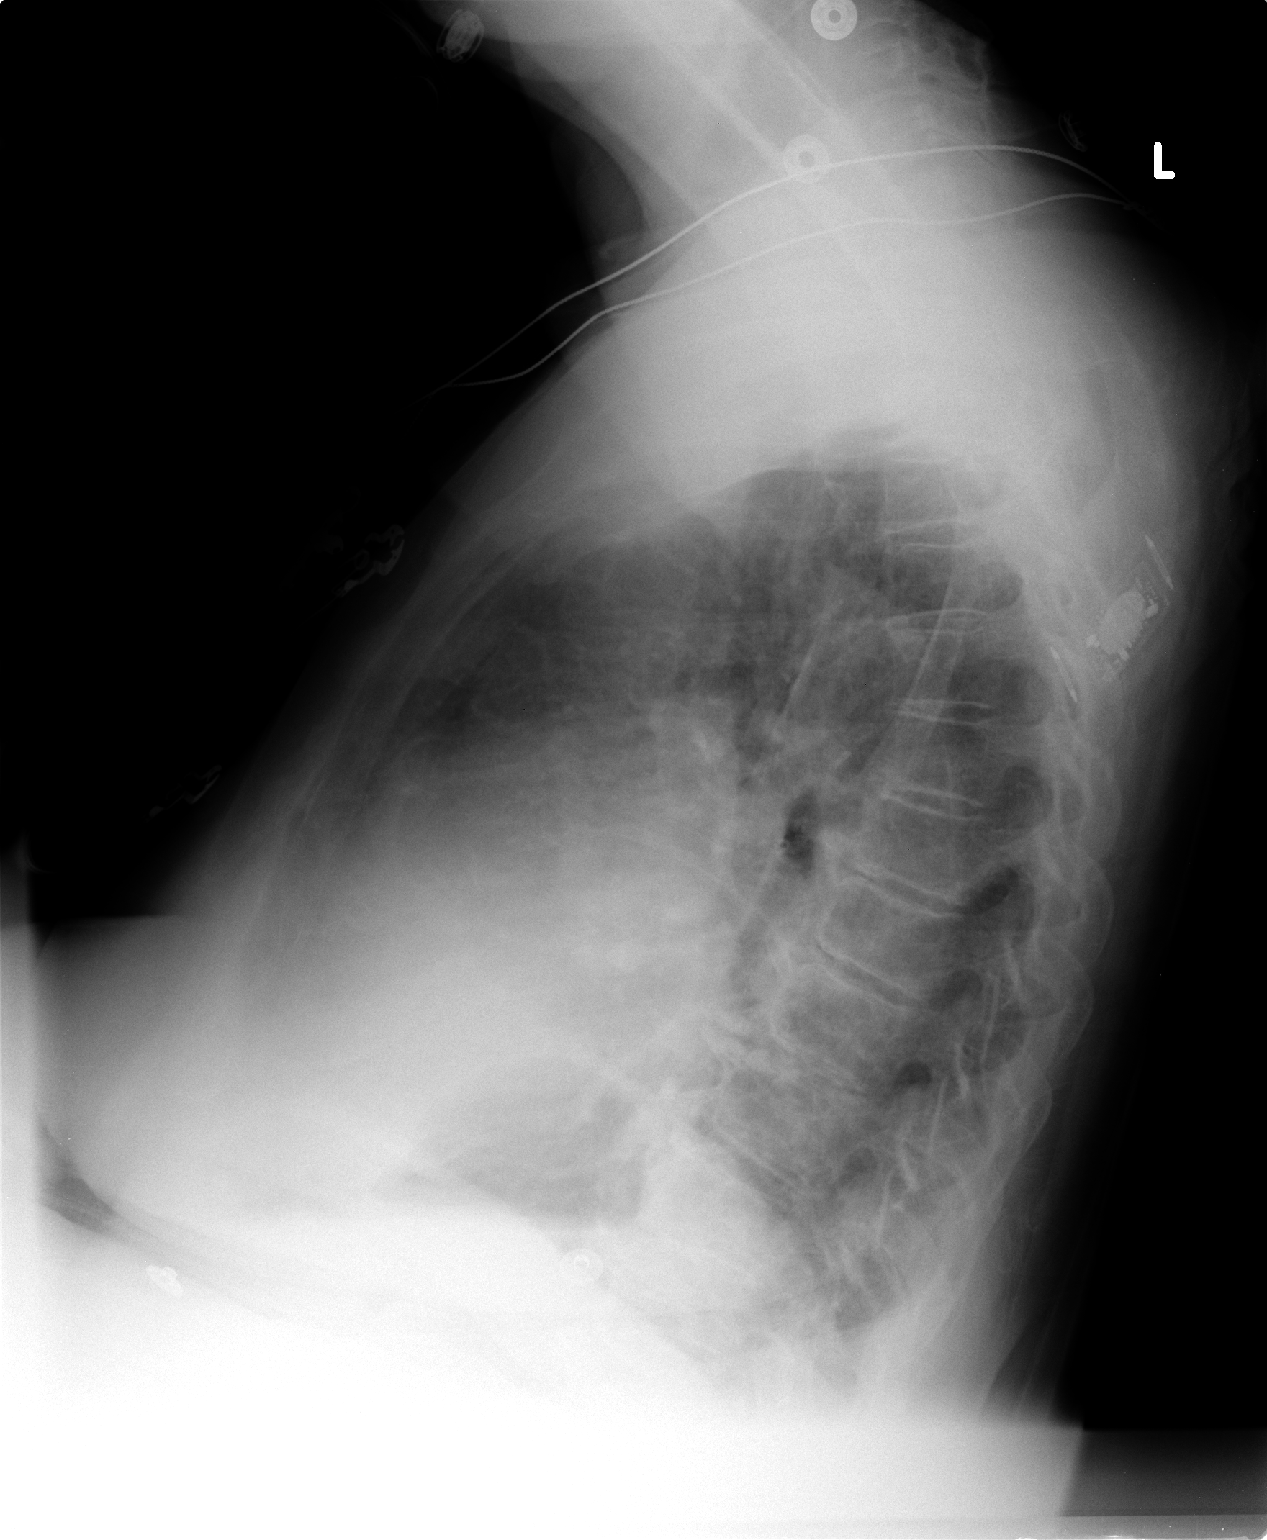

[2 of 2 positions shown; findings below may reference images not displayed]

FINDINGS: This is a lordotic view otherwise the heart size and mediastinal
contours are within normal limits. Both lungs are clear. The
visualized skeletal structures are unremarkable.
IMPRESSION: No active cardiopulmonary disease.

## 2015-04-19 ENCOUNTER — Other Ambulatory Visit: Payer: Self-pay

## 2015-04-20 ENCOUNTER — Other Ambulatory Visit: Payer: Self-pay | Admitting: Cardiology

## 2015-04-21 ENCOUNTER — Telehealth: Payer: Self-pay | Admitting: Cardiology

## 2015-04-21 MED ORDER — HYDROCHLOROTHIAZIDE 25 MG PO TABS: 25.0000 mg | ORAL_TABLET | ORAL | Status: DC

## 2015-04-21 NOTE — Telephone Encounter (Signed)
MD order is to take HCTZ 25 mg twice a week, is dur for November recall with dr Harl Bowie

## 2015-04-21 NOTE — Telephone Encounter (Signed)
Needs new RX for HCTZ with new directions. / tg

## 2015-04-22 NOTE — Telephone Encounter (Signed)
Received records from [facility], Dr Jaynee Eagles requested to [facility] 04/22/15.

## 2015-04-26 ENCOUNTER — Ambulatory Visit (INDEPENDENT_AMBULATORY_CARE_PROVIDER_SITE_OTHER): Payer: Medicare Other | Admitting: *Deleted

## 2015-04-26 DIAGNOSIS — R55 Syncope and collapse: Secondary | ICD-10-CM | POA: Diagnosis not present

## 2015-04-27 DIAGNOSIS — Z8719 Personal history of other diseases of the digestive system: Secondary | ICD-10-CM | POA: Diagnosis not present

## 2015-04-27 DIAGNOSIS — J3801 Paralysis of vocal cords and larynx, unilateral: Secondary | ICD-10-CM | POA: Diagnosis not present

## 2015-04-27 DIAGNOSIS — Z8619 Personal history of other infectious and parasitic diseases: Secondary | ICD-10-CM | POA: Diagnosis not present

## 2015-04-28 ENCOUNTER — Ambulatory Visit (INDEPENDENT_AMBULATORY_CARE_PROVIDER_SITE_OTHER): Payer: Medicare Other | Admitting: Neurology

## 2015-04-28 ENCOUNTER — Telehealth: Payer: Self-pay | Admitting: Neurology

## 2015-04-28 DIAGNOSIS — R413 Other amnesia: Secondary | ICD-10-CM

## 2015-04-28 DIAGNOSIS — R569 Unspecified convulsions: Secondary | ICD-10-CM

## 2015-04-28 NOTE — Procedures (Signed)
    History:  [name] is an 81 year old patient with a history of syncope and episodes of confusion. The patient had an episode in July 2016 associated with feeling weak, dizzy, with associated staring and jerking. The patient being evaluated for possible seizures.  This is a routine EEG. No skull defects are noted. Medications include Tylenol, Norvasc, aspirin, carbamazepine, Coreg, Plavix, Colace, hydrochlorothiazide, losartan, metformin, multivitamins, pravastatin, prednisone, and Zantac.   EEG classification:  Essentially normal awake  Description of the recording: The background rhythms of this recording consists of a fairly well modulated medium amplitude alpha rhythm of 7 to 8 Hz that is reactive to eye opening and closure. As the record progresses, the patient appears to remain in the waking state throughout the recording. Photic stimulation was not performed. Hyperventilation was also not performed. At no time during the recording does there appear to be evidence of spike or spike wave discharges or evidence of focal slowing. Particularly during the initial phases of the recording, excessive head movement and electrode artifact was present. EKG monitor shows no evidence of cardiac rhythm abnormalities with a heart rate of 60.  Impression: This is an essentially normal EEG recording in the waking state.  This is a technically difficult study secondary to head movement and electrode artifact. No evidence of ictal or interictal discharges are seen.

## 2015-04-28 NOTE — Telephone Encounter (Signed)
[name] - please let family know this was a normal EEG. Unfortunately this does not rule out seizures since it is just an hour study. i did discuss with family the possibility of doing an extended EEG at home, anywhere from 1-3 days. Would you ask if they are interested since this routine eeg was normal? We may catch seizures on a longer EEG. thanks

## 2015-04-29 NOTE — Progress Notes (Signed)
Loop recorder 

## 2015-04-29 NOTE — Telephone Encounter (Signed)
Called and spoke to daughter, Cornelia and advised EEG normal per Dr. Jaynee Eagles. Unfortunately this does not rule out seizures since it was just an hour study. Asked how they felt about extended EEG at home, anywhere from 1-3 days. She would like to go ahead and do that. I told her I would let Dr. Jaynee Eagles know. She verbalized understanding.

## 2015-04-29 NOTE — Telephone Encounter (Signed)
Filled out form as much as possible. Ready for you to sign Dr. Jaynee Eagles. Thank you

## 2015-04-29 NOTE — Telephone Encounter (Signed)
[name], fill out the form for me and give it to me so sign please on Monday. Thank you !!!

## 2015-05-03 ENCOUNTER — Encounter: Payer: Self-pay | Admitting: Internal Medicine

## 2015-05-05 LAB — CUP PACEART REMOTE DEVICE CHECK: Date Time Interrogation Session: 20160925040554

## 2015-05-05 NOTE — Progress Notes (Signed)
Carelink summary report reCarelink summary report received. Battery status OK. Normal device function. No new symptom episodes, tachy episodes, brady, or pause episodes. No new AF episodes. Monthly summary reports and ROV w/ JA PRN.

## 2015-05-05 NOTE — Telephone Encounter (Signed)
Thanks, give it to me tomorrow.

## 2015-05-06 DIAGNOSIS — E1151 Type 2 diabetes mellitus with diabetic peripheral angiopathy without gangrene: Secondary | ICD-10-CM | POA: Diagnosis not present

## 2015-05-06 DIAGNOSIS — B351 Tinea unguium: Secondary | ICD-10-CM | POA: Diagnosis not present

## 2015-05-06 DIAGNOSIS — L84 Corns and callosities: Secondary | ICD-10-CM | POA: Diagnosis not present

## 2015-05-07 ENCOUNTER — Encounter: Payer: Self-pay | Admitting: *Deleted

## 2015-05-07 NOTE — Progress Notes (Signed)
Faxed order to [facility] to schedule pt for 72-hour AMB EEG. Fax: [phone]. Received fax confirmation. Sent copy to medical records.

## 2015-05-07 NOTE — Progress Notes (Signed)
Received referral status notification from [facility] that referral for AMB EEG 72hr was received and being processed. They will update our office once pt is scheduled.

## 2015-05-10 ENCOUNTER — Telehealth: Payer: Self-pay | Admitting: Family Medicine

## 2015-05-10 NOTE — Telephone Encounter (Signed)
Ok. Only needs L as has R BKA. Thanks.

## 2015-05-10 NOTE — Telephone Encounter (Signed)
Pt req a rx for diabetic shoes to [employer] [facility]  [phone]

## 2015-05-11 ENCOUNTER — Encounter: Payer: Self-pay | Admitting: Family Medicine

## 2015-05-11 ENCOUNTER — Ambulatory Visit (INDEPENDENT_AMBULATORY_CARE_PROVIDER_SITE_OTHER): Payer: Medicare Other | Admitting: Family Medicine

## 2015-05-11 VITALS — BP 132/76 | HR 65 | Temp 97.5°F | Ht 60.0 in | Wt 155.1 lb

## 2015-05-11 DIAGNOSIS — Z89511 Acquired absence of right leg below knee: Secondary | ICD-10-CM

## 2015-05-11 DIAGNOSIS — E1151 Type 2 diabetes mellitus with diabetic peripheral angiopathy without gangrene: Secondary | ICD-10-CM | POA: Diagnosis not present

## 2015-05-11 DIAGNOSIS — I2510 Atherosclerotic heart disease of native coronary artery without angina pectoris: Secondary | ICD-10-CM

## 2015-05-11 DIAGNOSIS — Z449 Encounter for fitting and adjustment of unspecified external prosthetic device: Secondary | ICD-10-CM | POA: Diagnosis not present

## 2015-05-11 DIAGNOSIS — L84 Corns and callosities: Secondary | ICD-10-CM

## 2015-05-11 DIAGNOSIS — R634 Abnormal weight loss: Secondary | ICD-10-CM

## 2015-05-11 NOTE — Progress Notes (Signed)
Pre visit review using our clinic review tool, if applicable. No additional management support is needed unless otherwise documented below in the visit note. Weight not measured today due to patient being in a wheelchair.

## 2015-05-11 NOTE — Telephone Encounter (Signed)
Patient was seen today by Dr Maudie Mercury and the order was faxed to [facility] at [phone].
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
## 2015-05-13 DIAGNOSIS — L723 Sebaceous cyst: Secondary | ICD-10-CM | POA: Diagnosis not present

## 2015-05-14 ENCOUNTER — Other Ambulatory Visit: Payer: Self-pay | Admitting: Family Medicine

## 2015-05-21 ENCOUNTER — Encounter: Payer: Self-pay | Admitting: Internal Medicine

## 2015-05-25 ENCOUNTER — Ambulatory Visit (INDEPENDENT_AMBULATORY_CARE_PROVIDER_SITE_OTHER): Payer: Medicare Other | Admitting: *Deleted

## 2015-05-25 DIAGNOSIS — R55 Syncope and collapse: Secondary | ICD-10-CM | POA: Diagnosis not present

## 2015-05-26 NOTE — Progress Notes (Signed)
Loop recorder 

## 2015-05-31 ENCOUNTER — Encounter: Payer: Self-pay | Admitting: *Deleted

## 2015-05-31 NOTE — Progress Notes (Signed)
Received physician referral status notification from [facility] that pt is scheduled for AMB 72-hr EEG from 06/18/15-06/21/15. Copy sent to MR.  Phone: [phone]

## 2015-06-03 ENCOUNTER — Encounter: Payer: Self-pay | Admitting: Gastroenterology

## 2015-06-03 ENCOUNTER — Other Ambulatory Visit (INDEPENDENT_AMBULATORY_CARE_PROVIDER_SITE_OTHER): Payer: Medicare Other

## 2015-06-03 ENCOUNTER — Ambulatory Visit (INDEPENDENT_AMBULATORY_CARE_PROVIDER_SITE_OTHER): Payer: Medicare Other | Admitting: Gastroenterology

## 2015-06-03 VITALS — BP 134/52 | HR 68 | Ht 60.0 in | Wt 149.8 lb

## 2015-06-03 DIAGNOSIS — I2510 Atherosclerotic heart disease of native coronary artery without angina pectoris: Secondary | ICD-10-CM

## 2015-06-03 DIAGNOSIS — R197 Diarrhea, unspecified: Secondary | ICD-10-CM | POA: Diagnosis not present

## 2015-06-03 DIAGNOSIS — R630 Anorexia: Secondary | ICD-10-CM

## 2015-06-03 DIAGNOSIS — R634 Abnormal weight loss: Secondary | ICD-10-CM | POA: Diagnosis not present

## 2015-06-03 LAB — CBC WITH DIFFERENTIAL/PLATELET
BASOS PCT: 0.3 % (ref 0.0–3.0)
Basophils Absolute: 0 10*3/uL (ref 0.0–0.1)
EOS ABS: 0 10*3/uL (ref 0.0–0.7)
EOS PCT: 0.6 % (ref 0.0–5.0)
HCT: 34.2 % — ABNORMAL LOW (ref 36.0–46.0)
Hemoglobin: 10.8 g/dL — ABNORMAL LOW (ref 12.0–15.0)
LYMPHS ABS: 0.9 10*3/uL (ref 0.7–4.0)
Lymphocytes Relative: 18.3 % (ref 12.0–46.0)
MCHC: 31.6 g/dL (ref 30.0–36.0)
MCV: 84.9 fl (ref 78.0–100.0)
MONO ABS: 0.2 10*3/uL (ref 0.1–1.0)
Monocytes Relative: 4.5 % (ref 3.0–12.0)
Neutro Abs: 3.8 10*3/uL (ref 1.4–7.7)
Neutrophils Relative %: 76.3 % (ref 43.0–77.0)
Platelets: 183 10*3/uL (ref 150.0–400.0)
RBC: 4.03 Mil/uL (ref 3.87–5.11)
RDW: 15 % (ref 11.5–15.5)
WBC: 5 10*3/uL (ref 4.0–10.5)

## 2015-06-03 LAB — COMPREHENSIVE METABOLIC PANEL
ALBUMIN: 3.2 g/dL — AB (ref 3.5–5.2)
ALK PHOS: 77 U/L (ref 39–117)
ALT: 11 U/L (ref 0–35)
AST: 13 U/L (ref 0–37)
BUN: 21 mg/dL (ref 6–23)
CALCIUM: 9.2 mg/dL (ref 8.4–10.5)
CO2: 25 mEq/L (ref 19–32)
Chloride: 106 mEq/L (ref 96–112)
Creatinine, Ser: 0.84 mg/dL (ref 0.40–1.20)
GFR: 82.89 mL/min (ref >60.00)
Glucose, Bld: 112 mg/dL — ABNORMAL HIGH (ref 70–99)
POTASSIUM: 5 meq/L (ref 3.5–5.1)
Sodium: 142 mEq/L (ref 135–145)
TOTAL PROTEIN: 6.1 g/dL (ref 6.0–8.3)
Total Bilirubin: 0.3 mg/dL (ref 0.2–1.2)

## 2015-06-03 MED ORDER — NA SULFATE-K SULFATE-MG SULF 17.5-3.13-1.6 GM/177ML PO SOLN: 1.0000 | Freq: Once | ORAL | Status: DC

## 2015-06-03 NOTE — Patient Instructions (Signed)
You will have labs checked today in the basement lab.  Please head down after you check out with the front desk  (cbc, cmet). You will be set up for a colonoscopy for diarrhea, abd pains, weight loss. You will be set up for an upper endoscopy (at [facility] with MAC sedation given poor mobility).
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
## 2015-06-04 ENCOUNTER — Encounter (HOSPITAL_COMMUNITY): Payer: Self-pay | Admitting: *Deleted

## 2015-06-07 ENCOUNTER — Telehealth: Payer: Self-pay | Admitting: *Deleted

## 2015-06-07 ENCOUNTER — Ambulatory Visit: Payer: Medicare Other | Admitting: Gastroenterology

## 2015-06-07 NOTE — Telephone Encounter (Signed)
Per Dr. Ardis Hughs, Patient needs to be rescheduled for EGD procedure on 06-10-15. Spoke to patient and notified her of procedure date change. EGD reschedule to 06-17-15 at 11:45am. Patient verbalized understanding. Patient made aware that prep information is the same except for the procedure date. Patient has no question at this time.

## 2015-06-09 ENCOUNTER — Ambulatory Visit: Payer: Medicare Other | Admitting: Cardiology

## 2015-06-16 ENCOUNTER — Encounter: Payer: Medicare Other | Admitting: Family Medicine

## 2015-06-17 ENCOUNTER — Ambulatory Visit (HOSPITAL_COMMUNITY): Payer: Medicare Other | Admitting: Anesthesiology

## 2015-06-17 ENCOUNTER — Encounter (HOSPITAL_COMMUNITY)
Admission: RE | Disposition: A | Discharge status: Home / Self Care | Payer: Medicare Other | Source: Ambulatory Visit | Attending: Gastroenterology

## 2015-06-17 ENCOUNTER — Encounter (HOSPITAL_COMMUNITY): Payer: Self-pay

## 2015-06-17 ENCOUNTER — Ambulatory Visit (HOSPITAL_COMMUNITY)
Admission: RE | Admit: 2015-06-17 | Discharge: 2015-06-17 | Disposition: A | Discharge status: Home / Self Care | Payer: Medicare Other | Source: Ambulatory Visit | Attending: Gastroenterology | Admitting: Gastroenterology

## 2015-06-17 DIAGNOSIS — Z89511 Acquired absence of right leg below knee: Secondary | ICD-10-CM | POA: Insufficient documentation

## 2015-06-17 DIAGNOSIS — I739 Peripheral vascular disease, unspecified: Secondary | ICD-10-CM | POA: Diagnosis not present

## 2015-06-17 DIAGNOSIS — I110 Hypertensive heart disease with heart failure: Secondary | ICD-10-CM | POA: Insufficient documentation

## 2015-06-17 DIAGNOSIS — Z7952 Long term (current) use of systemic steroids: Secondary | ICD-10-CM | POA: Insufficient documentation

## 2015-06-17 DIAGNOSIS — Z7982 Long term (current) use of aspirin: Secondary | ICD-10-CM | POA: Diagnosis not present

## 2015-06-17 DIAGNOSIS — Z8673 Personal history of transient ischemic attack (TIA), and cerebral infarction without residual deficits: Secondary | ICD-10-CM | POA: Insufficient documentation

## 2015-06-17 DIAGNOSIS — K2970 Gastritis, unspecified, without bleeding: Secondary | ICD-10-CM | POA: Insufficient documentation

## 2015-06-17 DIAGNOSIS — E785 Hyperlipidemia, unspecified: Secondary | ICD-10-CM | POA: Diagnosis not present

## 2015-06-17 DIAGNOSIS — Z7984 Long term (current) use of oral hypoglycemic drugs: Secondary | ICD-10-CM | POA: Insufficient documentation

## 2015-06-17 DIAGNOSIS — M1990 Unspecified osteoarthritis, unspecified site: Secondary | ICD-10-CM | POA: Diagnosis not present

## 2015-06-17 DIAGNOSIS — Z8572 Personal history of non-Hodgkin lymphomas: Secondary | ICD-10-CM | POA: Diagnosis not present

## 2015-06-17 DIAGNOSIS — K2950 Unspecified chronic gastritis without bleeding: Secondary | ICD-10-CM | POA: Diagnosis not present

## 2015-06-17 DIAGNOSIS — R197 Diarrhea, unspecified: Secondary | ICD-10-CM | POA: Insufficient documentation

## 2015-06-17 DIAGNOSIS — Z79899 Other long term (current) drug therapy: Secondary | ICD-10-CM | POA: Diagnosis not present

## 2015-06-17 DIAGNOSIS — I2510 Atherosclerotic heart disease of native coronary artery without angina pectoris: Secondary | ICD-10-CM | POA: Diagnosis not present

## 2015-06-17 DIAGNOSIS — I509 Heart failure, unspecified: Secondary | ICD-10-CM | POA: Insufficient documentation

## 2015-06-17 DIAGNOSIS — I10 Essential (primary) hypertension: Secondary | ICD-10-CM | POA: Diagnosis not present

## 2015-06-17 DIAGNOSIS — R109 Unspecified abdominal pain: Secondary | ICD-10-CM | POA: Insufficient documentation

## 2015-06-17 DIAGNOSIS — R634 Abnormal weight loss: Secondary | ICD-10-CM | POA: Insufficient documentation

## 2015-06-17 DIAGNOSIS — E119 Type 2 diabetes mellitus without complications: Secondary | ICD-10-CM | POA: Diagnosis not present

## 2015-06-17 DIAGNOSIS — R630 Anorexia: Secondary | ICD-10-CM

## 2015-06-17 HISTORY — DX: Long term (current) use of systemic steroids: Z79.52

## 2015-06-17 HISTORY — PX: ESOPHAGOGASTRODUODENOSCOPY (EGD) WITH PROPOFOL: SHX5813

## 2015-06-17 HISTORY — DX: Personal history of other medical treatment: Z92.89

## 2015-06-17 HISTORY — PX: COLONOSCOPY WITH PROPOFOL: SHX5780

## 2015-06-17 LAB — GLUCOSE, CAPILLARY: Glucose-Capillary: 79 mg/dL (ref 65–99)

## 2015-06-17 SURGERY — COLONOSCOPY WITH PROPOFOL
Anesthesia: Monitor Anesthesia Care

## 2015-06-17 MED ORDER — LACTATED RINGERS IV SOLN
INTRAVENOUS | Status: DC | PRN
  Administered 2015-06-17: 12:00:00 via INTRAVENOUS

## 2015-06-17 MED ORDER — PROPOFOL 10 MG/ML IV BOLUS
INTRAVENOUS | Status: DC | PRN
  Administered 2015-06-17 (×4): 20 mg via INTRAVENOUS

## 2015-06-17 MED ORDER — PROPOFOL 10 MG/ML IV BOLUS
INTRAVENOUS | Status: AC
  Filled 2015-06-17: qty 40

## 2015-06-17 MED ORDER — SODIUM CHLORIDE 0.9 % IV SOLN: INTRAVENOUS | Status: DC

## 2015-06-17 MED ORDER — PROPOFOL 500 MG/50ML IV EMUL
INTRAVENOUS | Status: DC | PRN
  Administered 2015-06-17: 100 ug/kg/min via INTRAVENOUS

## 2015-06-17 SURGICAL SUPPLY — 24 items

## 2015-06-17 NOTE — Op Note (Signed)
[facility] [place], [address]   COLONOSCOPY PROCEDURE REPORT  PATIENT: [name], [name]  MR#: [id number] BIRTHDATE: 05-26-1928 , 86  yrs. old GENDER: female ENDOSCOPIST: [name], MD PROCEDURE DATE:  06/17/2015 PROCEDURE:   Colonoscopy, diagnostic and Colonoscopy with biopsy First Screening Colonoscopy - Avg.  risk and is 50 yrs.  old or older - No.  Prior Negative Screening - Now for repeat screening. N/A  History of Adenoma - Now for follow-up colonoscopy & has been > or = to 3 yrs.  N/A  Recommend repeat exam, <10 yrs? No ASA CLASS:   Class III INDICATIONS:diarrhea. MEDICATIONS: Monitored anesthesia care  DESCRIPTION OF PROCEDURE:   After the risks benefits and alternatives of the procedure were thoroughly explained, informed consent was obtained.  The digital rectal exam revealed no abnormalities of the rectum.   The Pentax Adult Colonscope [id number] endoscope was introduced through the anus and advanced to the terminal ileum which was intubated for a short distance. No adverse events experienced.   The quality of the prep was excellent.  The instrument was then slowly withdrawn as the colon was fully examined. Estimated blood loss is zero unless otherwise noted in this procedure report.   COLON FINDINGS: The examined terminal ileum appeared to be normal. A normal appearing cecum, ileocecal valve, and appendiceal orifice were identified.  The ascending, transverse, descending, sigmoid colon, and rectum appeared unremarkable.  Multiple random biopsies were performed using cold forceps.  Samples were sent to R/O microscopic colitis.  Retroflexed views revealed no abnormalities. The time to cecum = 5 min Withdrawal time = 6 min   The scope was withdrawn and the procedure completed. COMPLICATIONS: There were no immediate complications.  ENDOSCOPIC IMPRESSION: 1.   The examined terminal ileum appeared to be normal 2.   Normal  colonoscopy; multiple random biopsies were performed using cold forceps  RECOMMENDATIONS: Await final pathology report.  Will proceed with EGD now.  eSigned:  [name], MD 06/17/2015 1:48 PM
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
## 2015-06-17 NOTE — Op Note (Signed)
[place], [address]   ENDOSCOPY PROCEDURE REPORT  PATIENT: Sabrina, [name]  MR#: [id number] BIRTHDATE: [birth year] , 86  yrs. old GENDER: female ENDOSCOPIST: [name], MD PROCEDURE DATE:  06/17/2015 PROCEDURE:  EGD, diagnostic ASA CLASS:     Class III INDICATIONS:  abdominal pain, weight loss. MEDICATIONS: Monitored anesthesia care TOPICAL ANESTHETIC:  DESCRIPTION OF PROCEDURE: After the risks benefits and alternatives of the procedure were thoroughly explained, informed consent was obtained.  The Pentax Gastroscope [id number] endoscope was introduced through the mouth and advanced to the second portion of the duodenum , Without limitations.  The instrument was slowly withdrawn as the mucosa was fully examined.  There was mild, non-specific pangastritis.  This was biopsied in antrum and body, sent to pathology.  The examination was otherwise normal.  Retroflexed views revealed no abnormalities.     The scope was then withdrawn from the patient and the procedure completed. COMPLICATIONS: There were no immediate complications.  ENDOSCOPIC IMPRESSION: There was mild, non-specific pangastritis.  This was biopsied in antrum and body, sent to pathology.  The examination was otherwise normal  RECOMMENDATIONS: Await final pathology from this and from today's colonoscopy.  If no clear explanation for her weight loss, pains, loose stools then will likely recommend further workup with CT scan abdomen/pelvis.    eSigned:  [name], MD 06/17/2015 1:52 PM

## 2015-06-17 NOTE — Transfer of Care (Signed)
Immediate Anesthesia Transfer of Care Note  Patient: [name]  Procedure(s) Performed: Procedure(s): COLONOSCOPY WITH PROPOFOL (N/A) ESOPHAGOGASTRODUODENOSCOPY (EGD) WITH PROPOFOL (N/A)  Patient Location: PACU and Endoscopy Unit  Anesthesia Type:MAC  Level of Consciousness: awake and patient cooperative  Airway & Oxygen Therapy: Patient Spontanous Breathing and Patient connected to nasal cannula oxygen  Post-op Assessment: Report given to RN and Post -op Vital signs reviewed and stable  Post vital signs: Reviewed and stable  Last Vitals:  Filed Vitals:   06/17/15 1137  BP: 196/55  Pulse: 65  Temp: 36.8 C  Resp: 15    Complications: No apparent anesthesia complications

## 2015-06-17 NOTE — Anesthesia Preprocedure Evaluation (Signed)
Anesthesia Evaluation  Patient identified by MRN, date of birth, ID band Patient awake    Reviewed: Allergy & Precautions, NPO status , Patient's Chart, lab work & pertinent test results  Airway Mallampati: II  TM Distance: >3 FB Neck ROM: Full    Dental no notable dental hx.    Pulmonary neg pulmonary ROS,    Pulmonary exam normal breath sounds clear to auscultation       Cardiovascular hypertension, + CAD and +CHF  Normal cardiovascular exam Rhythm:Regular Rate:Normal     Neuro/Psych CVA negative psych ROS   GI/Hepatic negative GI ROS, Neg liver ROS,   Endo/Other  diabetes, Type 2, Oral Hypoglycemic Agents  Renal/GU negative Renal ROS  negative genitourinary   Musculoskeletal negative musculoskeletal ROS (+)   Abdominal   Peds negative pediatric ROS (+)  Hematology negative hematology ROS (+)   Anesthesia Other Findings   Reproductive/Obstetrics negative OB ROS                             Anesthesia Physical Anesthesia Plan  ASA: III  Anesthesia Plan: MAC   Post-op Pain Management:    Induction:   Airway Management Planned: Nasal Cannula  Additional Equipment:   Intra-op Plan:   Post-operative Plan:   Informed Consent: I have reviewed the patients History and Physical, chart, labs and discussed the procedure including the risks, benefits and alternatives for the proposed anesthesia with the patient or authorized representative who has indicated his/her understanding and acceptance.   Dental advisory given  Plan Discussed with: CRNA  Anesthesia Plan Comments:         Anesthesia Quick Evaluation

## 2015-06-17 NOTE — Anesthesia Postprocedure Evaluation (Signed)
  Anesthesia Post-op Note  Patient: [name]  Procedure(s) Performed: Procedure(s) (LRB): COLONOSCOPY WITH PROPOFOL (N/A) ESOPHAGOGASTRODUODENOSCOPY (EGD) WITH PROPOFOL (N/A)  Patient Location: PACU  Anesthesia Type: MAC  Level of Consciousness: awake and alert   Airway and Oxygen Therapy: Patient Spontanous Breathing  Post-op Pain: mild  Post-op Assessment: Post-op Vital signs reviewed, Patient's Cardiovascular Status Stable, Respiratory Function Stable, Patent Airway and No signs of Nausea or vomiting  Last Vitals:  Filed Vitals:   06/17/15 1420  BP: 197/68  Pulse: 59  Temp:   Resp: 13    Post-op Vital Signs: stable   Complications: No apparent anesthesia complications

## 2015-06-17 NOTE — Interval H&P Note (Signed)
History and Physical Interval Note:  06/17/2015 12:50 PM  [name]  has presented today for surgery, with the diagnosis of abd pain, weight loss  The various methods of treatment have been discussed with the patient and family. After consideration of risks, benefits and other options for treatment, the patient has consented to  Procedure(s): COLONOSCOPY WITH PROPOFOL (N/A) ESOPHAGOGASTRODUODENOSCOPY (EGD) WITH PROPOFOL (N/A) as a surgical intervention .  The patient's history has been reviewed, patient examined, no change in status, stable for surgery.  I have reviewed the patient's chart and labs.  Questions were answered to the patient's satisfaction.     [name]

## 2015-06-17 NOTE — Discharge Instructions (Signed)
Esophagogastroduodenoscopy, Care After Refer to this sheet in the next few weeks. These instructions provide you with information about caring for yourself after your procedure. Your health care provider may also give you more specific instructions. Your treatment has been planned according to current medical practices, but problems sometimes occur. Call your health care provider if you have any problems or questions after your procedure. WHAT TO EXPECT AFTER THE PROCEDURE After your procedure, it is typical to feel:  Soreness in your throat.  Pain with swallowing.  Sick to your stomach (nauseous).  Bloated.  Dizzy.  Fatigued. HOME CARE INSTRUCTIONS  Do not drive or operate machinery until directed by your health care provider.  Take medicines only as directed by your health care provider. SEEK MEDICAL CARE IF:   You cannot stop coughing.  You are not urinating at all or less than usual. SEEK IMMEDIATE MEDICAL CARE IF:  You have difficulty swallowing.  You cannot eat or drink.  You have worsening throat or chest pain.  You have dizziness or lightheadedness or you faint.  You have nausea or vomiting.  You have chills.  You have a fever.  You have severe abdominal pain.  You have black, tarry, or bloody stools.   This information is not intended to replace advice given to you by your health care provider. Make sure you discuss any questions you have with your health care provider.   Document Released: 07/03/2012 Document Revised: 08/07/2014 Document Reviewed: 07/03/2012 Elsevier Interactive Patient Education 2016 [employer].  Colonoscopy, Care After These instructions give you information on caring for yourself after your procedure. Your doctor may also give you more specific instructions. Call your doctor if you have any problems or questions after your procedure. HOME CARE  Do not drive for 24 hours.  Do not sign important papers or use machinery for 24  hours.  You may shower.  You may go back to your usual activities, but go slower for the first 24 hours.  Take rest breaks often during the first 24 hours.  Walk around or use warm packs on your belly (abdomen) if you have belly cramping or gas.  Drink enough fluids to keep your pee (urine) clear or pale yellow.  Resume your normal diet. Avoid heavy or fried foods.  Avoid drinking alcohol for 24 hours or as told by your doctor.  Only take medicines as told by your doctor. If a tissue sample (biopsy) was taken during the procedure:   Do not drink alcohol for 7 days, or as told by your doctor. GET HELP IF: You still have a small amount of blood in your poop (stool) 2-3 days after the procedure. GET HELP RIGHT AWAY IF:  You have more than a small amount of blood in your poop.  You see clumps of tissue (blood clots) in your poop.  Your belly is puffy (swollen).  You feel sick to your stomach (nauseous) or throw up (vomit).  You have a fever.  You have belly pain that gets worse and medicine does not help. MAKE SURE YOU:  Understand these instructions.  Will watch your condition.  Will get help right away if you are not doing well or get worse.   This information is not intended to replace advice given to you by your health care provider. Make sure you discuss any questions you have with your health care provider.   Document Released: 08/19/2010 Document Revised: 07/22/2013 Document Reviewed: 03/24/2013 Elsevier Interactive Patient Education 2016 Elsevier  Inc. ° °

## 2015-06-18 DIAGNOSIS — R569 Unspecified convulsions: Secondary | ICD-10-CM | POA: Diagnosis not present

## 2015-06-18 DIAGNOSIS — R404 Transient alteration of awareness: Secondary | ICD-10-CM | POA: Diagnosis not present

## 2015-06-19 DIAGNOSIS — R404 Transient alteration of awareness: Secondary | ICD-10-CM | POA: Diagnosis not present

## 2015-06-19 DIAGNOSIS — R569 Unspecified convulsions: Secondary | ICD-10-CM | POA: Diagnosis not present

## 2015-06-20 DIAGNOSIS — R404 Transient alteration of awareness: Secondary | ICD-10-CM | POA: Diagnosis not present

## 2015-06-20 DIAGNOSIS — R569 Unspecified convulsions: Secondary | ICD-10-CM | POA: Diagnosis not present

## 2015-06-21 ENCOUNTER — Encounter (HOSPITAL_COMMUNITY): Payer: Self-pay | Admitting: Gastroenterology

## 2015-06-22 ENCOUNTER — Encounter: Payer: Self-pay | Admitting: *Deleted

## 2015-06-22 NOTE — Progress Notes (Signed)
Received referral notification from [facility]. 72hr AMB EEG completed and loaded into their server. Will send notification once report ready. D/t holiday, they are closed Thurs/Fri and study may not be ready for review until Monday, Nov. 28th.

## 2015-06-23 ENCOUNTER — Ambulatory Visit (INDEPENDENT_AMBULATORY_CARE_PROVIDER_SITE_OTHER): Payer: Medicare Other | Admitting: *Deleted

## 2015-06-23 DIAGNOSIS — R55 Syncope and collapse: Secondary | ICD-10-CM

## 2015-06-25 LAB — CUP PACEART REMOTE DEVICE CHECK: MDC IDC SESS DTM: 20161025040614

## 2015-06-25 NOTE — Progress Notes (Signed)
[facility] summary report received. Battery status OK. Normal device function. No new symptom episodes, tachy episodes, brady, or pause episodes. No new AF episodes. Monthly summary reports and ROV with JA PRN. 

## 2015-06-28 NOTE — Progress Notes (Signed)
[facility] Summary Report / Loop Recorder 

## 2015-06-29 NOTE — Progress Notes (Signed)
Received referral notification from [facility]: AMB EEG scanned and technical report is created. It will be read by Dr Junius Argyle. They will email report to Dr Jaynee Eagles once ready.

## 2015-07-01 ENCOUNTER — Ambulatory Visit (INDEPENDENT_AMBULATORY_CARE_PROVIDER_SITE_OTHER): Payer: Medicare Other | Admitting: Cardiology

## 2015-07-01 VITALS — BP 122/58 | HR 57 | Ht 60.0 in | Wt 151.0 lb

## 2015-07-01 DIAGNOSIS — I10 Essential (primary) hypertension: Secondary | ICD-10-CM

## 2015-07-01 DIAGNOSIS — I2510 Atherosclerotic heart disease of native coronary artery without angina pectoris: Secondary | ICD-10-CM | POA: Diagnosis not present

## 2015-07-01 DIAGNOSIS — I350 Nonrheumatic aortic (valve) stenosis: Secondary | ICD-10-CM

## 2015-07-01 DIAGNOSIS — E785 Hyperlipidemia, unspecified: Secondary | ICD-10-CM | POA: Diagnosis not present

## 2015-07-01 DIAGNOSIS — R55 Syncope and collapse: Secondary | ICD-10-CM
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
## 2015-07-01 NOTE — Patient Instructions (Signed)
Your physician wants you to follow-up in: 6 months with Dr Branch You will receive a reminder letter in the mail two months in advance. If you don't receive a letter, please call our office to schedule the follow-up appointment.     Your physician recommends that you continue on your current medications as directed. Please refer to the Current Medication list given to you today.     If you need a refill on your cardiac medications before your next appointment, please call your pharmacy.     Thank you for choosing [facility] !        

## 2015-07-02 ENCOUNTER — Encounter: Payer: Medicare Other | Admitting: Family Medicine

## 2015-07-02 ENCOUNTER — Other Ambulatory Visit: Payer: Self-pay

## 2015-07-02 DIAGNOSIS — R634 Abnormal weight loss: Secondary | ICD-10-CM

## 2015-07-02 DIAGNOSIS — R109 Unspecified abdominal pain: Secondary | ICD-10-CM

## 2015-07-03 ENCOUNTER — Encounter: Payer: Self-pay | Admitting: Cardiology

## 2015-07-05 NOTE — Progress Notes (Signed)
Received results from AMB EEG and given to Dr Jaynee Eagles.

## 2015-07-09 ENCOUNTER — Encounter (HOSPITAL_COMMUNITY): Payer: Self-pay

## 2015-07-09 ENCOUNTER — Telehealth: Payer: Self-pay | Admitting: Neurology

## 2015-07-09 ENCOUNTER — Ambulatory Visit (HOSPITAL_COMMUNITY)
Admission: RE | Admit: 2015-07-09 | Discharge: 2015-07-09 | Disposition: A | Discharge status: Home / Self Care | Payer: Medicare Other | Source: Ambulatory Visit | Attending: Gastroenterology | Admitting: Gastroenterology

## 2015-07-09 DIAGNOSIS — R109 Unspecified abdominal pain: Secondary | ICD-10-CM

## 2015-07-09 DIAGNOSIS — K862 Cyst of pancreas: Secondary | ICD-10-CM | POA: Diagnosis not present

## 2015-07-09 DIAGNOSIS — R634 Abnormal weight loss: Secondary | ICD-10-CM | POA: Insufficient documentation

## 2015-07-09 DIAGNOSIS — I7090 Unspecified atherosclerosis: Secondary | ICD-10-CM | POA: Insufficient documentation

## 2015-07-09 DIAGNOSIS — N281 Cyst of kidney, acquired: Secondary | ICD-10-CM | POA: Insufficient documentation

## 2015-07-09 DIAGNOSIS — K8689 Other specified diseases of pancreas: Secondary | ICD-10-CM | POA: Diagnosis not present

## 2015-07-09 MED ORDER — IOHEXOL 300 MG/ML  SOLN
100.0000 mL | Freq: Once | INTRAMUSCULAR | Status: AC | PRN
  Administered 2015-07-09: 100 mL via INTRAVENOUS

## 2015-07-09 NOTE — Telephone Encounter (Signed)
thanks

## 2015-07-09 NOTE — Telephone Encounter (Signed)
Larey Seat, can you call patient and her family and let them know that the EEG was abnormal and I would like to see them in the office to discuss?  I need 30 minutes thanks

## 2015-07-09 NOTE — Telephone Encounter (Signed)
Spoke to daughter Cornelia. Scheduled appt for 07/20/2015 per patient and daughter's schedule.

## 2015-07-10 ENCOUNTER — Other Ambulatory Visit: Payer: Self-pay | Admitting: Family Medicine

## 2015-07-14 ENCOUNTER — Other Ambulatory Visit: Payer: Self-pay | Admitting: Family Medicine

## 2015-07-14 ENCOUNTER — Other Ambulatory Visit: Payer: Self-pay

## 2015-07-14 DIAGNOSIS — R109 Unspecified abdominal pain: Secondary | ICD-10-CM

## 2015-07-14 DIAGNOSIS — K551 Chronic vascular disorders of intestine: Secondary | ICD-10-CM

## 2015-07-14 DIAGNOSIS — R634 Abnormal weight loss: Secondary | ICD-10-CM

## 2015-07-19 ENCOUNTER — Other Ambulatory Visit: Payer: Self-pay | Admitting: Family Medicine

## 2015-07-19 MED ORDER — METFORMIN HCL 1000 MG PO TABS: ORAL_TABLET | ORAL | Status: DC

## 2015-07-19 NOTE — Telephone Encounter (Signed)
Pt asking for 90 day supply (last filled for 30 day) and has CPX appt on 07/20/15

## 2015-07-20 ENCOUNTER — Ambulatory Visit (INDEPENDENT_AMBULATORY_CARE_PROVIDER_SITE_OTHER): Payer: Medicare Other | Admitting: Family Medicine

## 2015-07-20 ENCOUNTER — Ambulatory Visit (INDEPENDENT_AMBULATORY_CARE_PROVIDER_SITE_OTHER): Payer: Medicare Other | Admitting: Neurology

## 2015-07-20 ENCOUNTER — Encounter: Payer: Self-pay | Admitting: Neurology

## 2015-07-20 ENCOUNTER — Encounter: Payer: Self-pay | Admitting: Family Medicine

## 2015-07-20 VITALS — BP 142/68 | HR 59 | Temp 97.5°F | Ht 60.0 in | Wt 149.7 lb

## 2015-07-20 VITALS — BP 165/75 | HR 63

## 2015-07-20 DIAGNOSIS — Z0000 Encounter for general adult medical examination without abnormal findings: Secondary | ICD-10-CM | POA: Diagnosis not present

## 2015-07-20 DIAGNOSIS — I5032 Chronic diastolic (congestive) heart failure: Secondary | ICD-10-CM

## 2015-07-20 DIAGNOSIS — R569 Unspecified convulsions: Secondary | ICD-10-CM | POA: Diagnosis not present

## 2015-07-20 DIAGNOSIS — E669 Obesity, unspecified: Secondary | ICD-10-CM

## 2015-07-20 DIAGNOSIS — Z89511 Acquired absence of right leg below knee: Secondary | ICD-10-CM

## 2015-07-20 DIAGNOSIS — I2510 Atherosclerotic heart disease of native coronary artery without angina pectoris: Secondary | ICD-10-CM

## 2015-07-20 DIAGNOSIS — E785 Hyperlipidemia, unspecified: Secondary | ICD-10-CM

## 2015-07-20 DIAGNOSIS — R55 Syncope and collapse: Secondary | ICD-10-CM

## 2015-07-20 DIAGNOSIS — E1151 Type 2 diabetes mellitus with diabetic peripheral angiopathy without gangrene: Secondary | ICD-10-CM | POA: Diagnosis not present

## 2015-07-20 DIAGNOSIS — I700 Atherosclerosis of aorta: Secondary | ICD-10-CM

## 2015-07-20 DIAGNOSIS — M069 Rheumatoid arthritis, unspecified: Secondary | ICD-10-CM

## 2015-07-20 DIAGNOSIS — I739 Peripheral vascular disease, unspecified: Secondary | ICD-10-CM

## 2015-07-20 DIAGNOSIS — D638 Anemia in other chronic diseases classified elsewhere: Secondary | ICD-10-CM

## 2015-07-20 LAB — LIPID PANEL
CHOLESTEROL: 190 mg/dL (ref 0–200)
HDL: 75.9 mg/dL (ref >39.00)
LDL Cholesterol: 98 mg/dL (ref 0–99)
NonHDL: 113.91
Total CHOL/HDL Ratio: 3
Triglycerides: 79 mg/dL (ref 0.0–149.0)
VLDL: 15.8 mg/dL (ref 0.0–40.0)

## 2015-07-20 MED ORDER — LEVETIRACETAM 500 MG PO TABS: 500.0000 mg | ORAL_TABLET | Freq: Two times a day (BID) | ORAL | Status: DC

## 2015-07-20 NOTE — Patient Instructions (Signed)
BEFORE YOU LEAVE: -advanced directives assistance with Luc[facility] Mellow -labs -follow up in 4 months  -We have ordered labs or studies at this visit. It can take up to 1-2 weeks for results and processing. We will contact you with instructions IF your results are abnormal. Normal results will be released to your [facility]. If you have not heard from [place] or can not find your results in [facility] in 2 weeks please contact our office.  We recommend the following healthy lifestyle measures: - eat a healthy whole foods diet consisting of regular small meals composed of vegetables, fruits, beans, nuts, seeds, healthy meats such as white chicken and fish and whole grains.  - avoid sweets, white starchy foods, fried foods, fast food, processed foods, sodas, red meet and other fattening foods.  - get as much activity as possible even if this is chair based exercises
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
## 2015-07-20 NOTE — Progress Notes (Deleted)
Spent 25 minutes discussing the following issues regarding advanced directives: -Goals of care -Code status -Difference between hospice and palliative care -Living Will -Reason and significance for power of attorney -Difference between MOST form/DNR.   -What entails a full code status Both patient and 2 daughters were present.  Patient was alert and oriented x3 with no impairment.  No paperwork was completed during this.

## 2015-07-20 NOTE — Progress Notes (Signed)
Pre visit review using our clinic review tool, if applicable. No additional management support is needed unless otherwise documented below in the visit note. 

## 2015-07-20 NOTE — Patient Instructions (Signed)
Overall you are doing fairly well but I do want to suggest a few things today:   Remember to drink plenty of fluid, eat healthy meals and do not skip any meals. Try to eat protein with a every meal and eat a healthy snack such as fruit or nuts in between meals. Try to keep a regular sleep-wake schedule and try to exercise daily, particularly in the form of walking, 20-30 minutes a day, if you can.   As far as your medications are concerned, I would like to suggest: Keppra 500mg  twice daily  I would like to see you back in 3-4 months, sooner if we need to. Please call us with any interim questions, concerns, problems, updates or refill requests.   Our phone number is [phone]. We also have an after hours call service for urgent matters and there is a physician on-call for urgent questions. For any emergencies you know to call 911 or go to the nearest emergency room

## 2015-07-20 NOTE — Progress Notes (Signed)
     Spent 25 minutes discussing the following issues regarding advanced directives:  -Goals of care  -Code status  -Difference between hospice and palliative care  -Living Will  -Reason and significance for power of attorney  -Difference between MOST form/DNR.  -What entails a full code status  Both patient and 2 daughters were present. Patient was alert and oriented x3 with no impairment. No paperwork was completed during this.
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
## 2015-07-23 ENCOUNTER — Ambulatory Visit (INDEPENDENT_AMBULATORY_CARE_PROVIDER_SITE_OTHER): Payer: Medicare Other | Admitting: *Deleted

## 2015-07-23 ENCOUNTER — Other Ambulatory Visit: Payer: Self-pay

## 2015-07-23 DIAGNOSIS — K551 Chronic vascular disorders of intestine: Secondary | ICD-10-CM

## 2015-07-23 DIAGNOSIS — R55 Syncope and collapse: Secondary | ICD-10-CM

## 2015-07-23 DIAGNOSIS — R109 Unspecified abdominal pain: Secondary | ICD-10-CM

## 2015-07-27 ENCOUNTER — Telehealth: Payer: Self-pay | Admitting: Neurology

## 2015-07-27 ENCOUNTER — Ambulatory Visit: Payer: Medicare Other | Admitting: Neurology

## 2015-07-27 NOTE — Telephone Encounter (Signed)
[name] called to advise that Dr. Jaynee Eagles put patient on levETIRAcetam (KEPPRA) 500 MG tablet at last visit, Cornelia feels that dosage may be too much because she is too lethargic, sleeing a lot, difficulty getting around, they started giving patient 1 pill instead of 2 yesterday.

## 2015-07-27 NOTE — Progress Notes (Signed)
[facility] Summary Report / Loop Recorder 

## 2015-07-28 NOTE — Telephone Encounter (Signed)
Spoke with daughter, Cornelia who states her sister gave patient Keppra 1/2 tab twice yesterday. Cornelia stated her mother became more lethargic at times, not eating as well, so that is why they reduced her dose. She states her sister stayed with patient last night, and she has not talked with her to know how patient did overnight. Cornelia will be with her tonight. She stated she will observe how patient is acting to see if reducing Keppra dose has helped.  She wants Dr Jaynee Eagles to know. Informed her Dr Jaynee Eagles and Terrence Dupont RN return to office tomorrow, and she will most likely hear from one of them to let her know if reduced Keppra dose is acceptable with dr. She verbalized understanding, appreciation for call.

## 2015-07-29 ENCOUNTER — Encounter: Payer: Self-pay | Admitting: *Deleted

## 2015-07-29 ENCOUNTER — Other Ambulatory Visit: Payer: Self-pay | Admitting: Neurology

## 2015-07-29 MED ORDER — DIVALPROEX SODIUM 500 MG PO DR TAB: 500.0000 mg | DELAYED_RELEASE_TABLET | Freq: Every day | ORAL | Status: DC

## 2015-07-29 NOTE — Telephone Encounter (Signed)
Please stop the keppra and start Depakote at night. Discussed side effects. Side effects of Depakote include drowsiness, weakness, nausea, vomiting, stomach upset, diarrhea, constipation, mood swings, changes in menstrual periods, enlarged breasts, weight changes, agitation, tremor (shaking), vision changes, unusual or unpleasant taste in your mouth, and hair loss.Depakote can csause increased levels of carbamazepine but she is on a very low dose and I don't believe this will be a problem. We can check levels at next appointment but stop for anything concerning.

## 2015-07-29 NOTE — Telephone Encounter (Signed)
Called and relayed Dr Jaynee Eagles message below. [name] verbalized understanding and stated she also spoke to Dr Jaynee Eagles. Told her to call for any SE.

## 2015-08-03 ENCOUNTER — Other Ambulatory Visit: Payer: Self-pay | Admitting: Neurology

## 2015-08-03 MED ORDER — DIVALPROEX SODIUM ER 250 MG PO TB24: 500.0000 mg | ORAL_TABLET | Freq: Every day | ORAL | Status: DC

## 2015-08-03 NOTE — Telephone Encounter (Signed)
Called pt pharmacy and Depakote 500mg  DR cannot be cut in half.

## 2015-08-03 NOTE — Telephone Encounter (Signed)
Called daughter back. She had to put her in wheelchair to be able to move her while pt on whole dose (500mg ) of Depakote. She cut back to 250mg  instead of 500mg . More alert today. Was able to walk from bedroom to kitchen. Started decreased dose on Sunday evening (1/2 tablet). Advised I will speak with Dr Jaynee Eagles and call her back to let her know if Dr Jaynee Eagles okay w/ dosing.

## 2015-08-03 NOTE — Telephone Encounter (Signed)
I called in 250mg  ER pills. They are going to start one a day and then increase to 2 a day as tolerated. Can you call the pharmacy at your convenience and cancel the 500mg  DR prescription? Thanks!

## 2015-08-03 NOTE — Telephone Encounter (Signed)
Daughter Cornelia called to advise that Dr. Jaynee Eagles switched patient to Depakote, started Depakote Friday evening, states this medication is doing the same thing as previous medication, they cut in 1/2 and patient seems to be doing fine now, patient is more alert today than she has been, has had to transport her around the house in wheelchair, however this morning she was able to get around with her walker. Wants to know if this is what they should continue to do? Please call to advise [phone] or [phone] (at this number until 2:30pm today).

## 2015-08-04 NOTE — Telephone Encounter (Signed)
Called pt pharmacy and cx rx depakote 500mg  DR per Dr Jaynee Eagles request.

## 2015-08-07 LAB — CUP PACEART REMOTE DEVICE CHECK: Date Time Interrogation Session: 20161124043548

## 2015-08-11 ENCOUNTER — Other Ambulatory Visit: Payer: Self-pay | Admitting: Cardiology

## 2015-08-17 ENCOUNTER — Telehealth: Payer: Self-pay | Admitting: Neurology

## 2015-08-17 NOTE — Telephone Encounter (Signed)
Per Dr Jaynee Eagles- wants pt to come in for f/u appt tomorrow. Okay for pt to see Ward Givens, NP

## 2015-08-17 NOTE — Telephone Encounter (Signed)
Called and spoke to [name]. Scheduled f/u for 08/18/15 at 11am, check in 1045am with Hedwig Morton, NP. Advised Dr Jaynee Eagles would like them to come in for f/u. She verbalized understanding.

## 2015-08-17 NOTE — Telephone Encounter (Signed)
Pt's daughter (Cornelia) called requesting a return call to her sister-Diedra (who is with the pt now). She said pt is still having problems with medication adjustment. She did want to go into detail.

## 2015-08-17 NOTE — Telephone Encounter (Signed)
Called Diedre back as requested. She stated pt used to be able to slide herself over to bath chair. She cannot anymore. Right side very weak and seems "slumped". She gets up to try and walk but she is not moving. Denies slurred speech/dropping of face. Pt thinks she is moving and actually is not walking. Started the last 3-4 weeks. She is still taking depakote 250mg  once daily. Has not increased to 2x per day. Told her Dr Jaynee Eagles w/ a pt and I will speak to her once she is out and call her back to advise. She verbalized understanding.

## 2015-08-18 ENCOUNTER — Encounter: Payer: Self-pay | Admitting: Adult Health

## 2015-08-18 ENCOUNTER — Encounter: Payer: Self-pay | Admitting: Vascular Surgery

## 2015-08-18 ENCOUNTER — Ambulatory Visit (INDEPENDENT_AMBULATORY_CARE_PROVIDER_SITE_OTHER): Payer: Medicare Other | Admitting: Adult Health

## 2015-08-18 VITALS — BP 112/55 | HR 60 | Ht 60.0 in

## 2015-08-18 DIAGNOSIS — Z5181 Encounter for therapeutic drug level monitoring: Secondary | ICD-10-CM

## 2015-08-18 DIAGNOSIS — R269 Unspecified abnormalities of gait and mobility: Secondary | ICD-10-CM | POA: Diagnosis not present

## 2015-08-18 DIAGNOSIS — R569 Unspecified convulsions: Secondary | ICD-10-CM | POA: Diagnosis not present
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
## 2015-08-18 NOTE — Patient Instructions (Signed)
Continue Depakote Will check blood work today Referral for PT If your symptoms worsen or you develop new symptoms please let us know.

## 2015-08-19 ENCOUNTER — Telehealth: Payer: Self-pay | Admitting: Adult Health

## 2015-08-19 LAB — COMPREHENSIVE METABOLIC PANEL
ALBUMIN: 3.6 g/dL (ref 3.5–4.7)
ALK PHOS: 65 IU/L (ref 39–117)
ALT: 11 IU/L (ref 0–32)
AST: 11 IU/L (ref 0–40)
Albumin/Globulin Ratio: 1.6 (ref 1.1–2.5)
BUN/Creatinine Ratio: 16 (ref 11–26)
BUN: 11 mg/dL (ref 8–27)
Bilirubin Total: <0.2 mg/dL (ref 0.0–1.2)
CO2: 25 mmol/L (ref 18–29)
CREATININE: 0.69 mg/dL (ref 0.57–1.00)
Calcium: 9.2 mg/dL (ref 8.7–10.3)
Chloride: 103 mmol/L (ref 96–106)
GFR calc Af Amer: 92 mL/min/{1.73_m2} (ref >59)
GFR calc non Af Amer: 80 mL/min/{1.73_m2} (ref >59)
GLUCOSE: 118 mg/dL — AB (ref 65–99)
Globulin, Total: 2.3 g/dL (ref 1.5–4.5)
Potassium: 4.8 mmol/L (ref 3.5–5.2)
Sodium: 144 mmol/L (ref 134–144)
Total Protein: 5.9 g/dL — ABNORMAL LOW (ref 6.0–8.5)

## 2015-08-19 LAB — CBC WITH DIFFERENTIAL/PLATELET
BASOS ABS: 0 10*3/uL (ref 0.0–0.2)
Basos: 0 %
EOS (ABSOLUTE): 0.1 10*3/uL (ref 0.0–0.4)
EOS: 1 %
HEMATOCRIT: 31.9 % — AB (ref 34.0–46.6)
HEMOGLOBIN: 10 g/dL — AB (ref 11.1–15.9)
IMMATURE GRANULOCYTES: 0 %
Immature Grans (Abs): 0 10*3/uL (ref 0.0–0.1)
LYMPHS: 21 %
Lymphocytes Absolute: 1 10*3/uL (ref 0.7–3.1)
MCH: 27.2 pg (ref 26.6–33.0)
MCHC: 31.3 g/dL — ABNORMAL LOW (ref 31.5–35.7)
MCV: 87 fL (ref 79–97)
MONOCYTES: 8 %
Monocytes Absolute: 0.4 10*3/uL (ref 0.1–0.9)
NEUTROS PCT: 70 %
Neutrophils Absolute: 3.4 10*3/uL (ref 1.4–7.0)
Platelets: 185 10*3/uL (ref 150–379)
RBC: 3.67 x10E6/uL — AB (ref 3.77–5.28)
RDW: 14.5 % (ref 12.3–15.4)
WBC: 4.8 10*3/uL (ref 3.4–10.8)

## 2015-08-19 LAB — VALPROIC ACID LEVEL: VALPROIC ACID LVL: 23 ug/mL — AB (ref 50–100)

## 2015-08-19 NOTE — Telephone Encounter (Signed)
I called the patient's daughter. Explained that blood work was okay however she does have a low red blood cell, hemoglobin and hematocrit. Her hemoglobin and hematocrit was low 2 months ago and has gotten slightly lower since then. I advised that I would forward this to her primary care provider. They should schedule a follow-up visit with her primary care to review this. She verbalized understanding.

## 2015-08-20 DIAGNOSIS — I739 Peripheral vascular disease, unspecified: Secondary | ICD-10-CM | POA: Diagnosis not present

## 2015-08-20 DIAGNOSIS — F339 Major depressive disorder, recurrent, unspecified: Secondary | ICD-10-CM | POA: Diagnosis not present

## 2015-08-20 DIAGNOSIS — M150 Primary generalized (osteo)arthritis: Secondary | ICD-10-CM | POA: Diagnosis not present

## 2015-08-20 DIAGNOSIS — I509 Heart failure, unspecified: Secondary | ICD-10-CM | POA: Diagnosis not present

## 2015-08-20 DIAGNOSIS — Z89611 Acquired absence of right leg above knee: Secondary | ICD-10-CM | POA: Diagnosis not present

## 2015-08-20 DIAGNOSIS — E119 Type 2 diabetes mellitus without complications: Secondary | ICD-10-CM | POA: Diagnosis not present

## 2015-08-20 DIAGNOSIS — Z7952 Long term (current) use of systemic steroids: Secondary | ICD-10-CM | POA: Diagnosis not present

## 2015-08-20 DIAGNOSIS — Z7984 Long term (current) use of oral hypoglycemic drugs: Secondary | ICD-10-CM | POA: Diagnosis not present

## 2015-08-20 DIAGNOSIS — Z8571 Personal history of Hodgkin lymphoma: Secondary | ICD-10-CM | POA: Diagnosis not present

## 2015-08-20 DIAGNOSIS — Z7982 Long term (current) use of aspirin: Secondary | ICD-10-CM | POA: Diagnosis not present

## 2015-08-20 DIAGNOSIS — G40909 Epilepsy, unspecified, not intractable, without status epilepticus: Secondary | ICD-10-CM | POA: Diagnosis not present

## 2015-08-20 DIAGNOSIS — I110 Hypertensive heart disease with heart failure: Secondary | ICD-10-CM | POA: Diagnosis not present

## 2015-08-20 DIAGNOSIS — I2510 Atherosclerotic heart disease of native coronary artery without angina pectoris: Secondary | ICD-10-CM | POA: Diagnosis not present

## 2015-08-20 DIAGNOSIS — R260 Ataxic gait: Secondary | ICD-10-CM | POA: Diagnosis not present

## 2015-08-20 DIAGNOSIS — Z9181 History of falling: Secondary | ICD-10-CM | POA: Diagnosis not present

## 2015-08-20 DIAGNOSIS — Z8673 Personal history of transient ischemic attack (TIA), and cerebral infarction without residual deficits: Secondary | ICD-10-CM | POA: Diagnosis not present

## 2015-08-20 DIAGNOSIS — Z7902 Long term (current) use of antithrombotics/antiplatelets: Secondary | ICD-10-CM | POA: Diagnosis not present

## 2015-08-23 ENCOUNTER — Ambulatory Visit (INDEPENDENT_AMBULATORY_CARE_PROVIDER_SITE_OTHER): Payer: Medicare Other | Admitting: *Deleted

## 2015-08-23 ENCOUNTER — Telehealth: Payer: Self-pay | Admitting: Neurology

## 2015-08-23 DIAGNOSIS — R55 Syncope and collapse: Secondary | ICD-10-CM | POA: Diagnosis not present

## 2015-08-23 NOTE — Telephone Encounter (Signed)
Per Dr Jaynee Eagles- ok to give verbal order

## 2015-08-23 NOTE — Telephone Encounter (Signed)
[name], PT/[facility] called requesting verbal order for therapeutic exercises, transfer training and gait training, 3 x week for 2 weeks, 2 x week for 5 weeks, 1 x week for 1 week.

## 2015-08-23 NOTE — Telephone Encounter (Signed)
Called Danny Angle back and gave verbal orders requested per Dr Jaynee Eagles. He verbalized understanding.

## 2015-08-24 DIAGNOSIS — E119 Type 2 diabetes mellitus without complications: Secondary | ICD-10-CM | POA: Diagnosis not present

## 2015-08-24 DIAGNOSIS — M150 Primary generalized (osteo)arthritis: Secondary | ICD-10-CM | POA: Diagnosis not present

## 2015-08-24 DIAGNOSIS — I509 Heart failure, unspecified: Secondary | ICD-10-CM | POA: Diagnosis not present

## 2015-08-24 DIAGNOSIS — F339 Major depressive disorder, recurrent, unspecified: Secondary | ICD-10-CM | POA: Diagnosis not present

## 2015-08-24 DIAGNOSIS — I110 Hypertensive heart disease with heart failure: Secondary | ICD-10-CM | POA: Diagnosis not present

## 2015-08-24 DIAGNOSIS — G40909 Epilepsy, unspecified, not intractable, without status epilepticus: Secondary | ICD-10-CM | POA: Diagnosis not present

## 2015-08-24 NOTE — Progress Notes (Signed)
[facility] Summary Report / Loop Recorder 

## 2015-08-25 DIAGNOSIS — G40909 Epilepsy, unspecified, not intractable, without status epilepticus: Secondary | ICD-10-CM | POA: Diagnosis not present

## 2015-08-25 DIAGNOSIS — I509 Heart failure, unspecified: Secondary | ICD-10-CM | POA: Diagnosis not present

## 2015-08-25 DIAGNOSIS — F339 Major depressive disorder, recurrent, unspecified: Secondary | ICD-10-CM | POA: Diagnosis not present

## 2015-08-25 DIAGNOSIS — I110 Hypertensive heart disease with heart failure: Secondary | ICD-10-CM | POA: Diagnosis not present

## 2015-08-25 DIAGNOSIS — E119 Type 2 diabetes mellitus without complications: Secondary | ICD-10-CM | POA: Diagnosis not present

## 2015-08-25 DIAGNOSIS — M150 Primary generalized (osteo)arthritis: Secondary | ICD-10-CM | POA: Diagnosis not present

## 2015-08-26 ENCOUNTER — Encounter: Payer: Self-pay | Admitting: Vascular Surgery

## 2015-08-26 ENCOUNTER — Ambulatory Visit (INDEPENDENT_AMBULATORY_CARE_PROVIDER_SITE_OTHER): Payer: Medicare Other | Admitting: Vascular Surgery

## 2015-08-26 ENCOUNTER — Ambulatory Visit (HOSPITAL_COMMUNITY)
Admission: RE | Admit: 2015-08-26 | Discharge: 2015-08-26 | Disposition: A | Discharge status: Home / Self Care | Payer: Medicare Other | Source: Ambulatory Visit | Attending: Vascular Surgery | Admitting: Vascular Surgery

## 2015-08-26 VITALS — BP 138/70 | HR 61 | Ht 60.0 in | Wt 147.7 lb

## 2015-08-26 DIAGNOSIS — R634 Abnormal weight loss: Secondary | ICD-10-CM | POA: Diagnosis not present

## 2015-08-26 DIAGNOSIS — R109 Unspecified abdominal pain: Secondary | ICD-10-CM | POA: Diagnosis not present

## 2015-08-26 DIAGNOSIS — K551 Chronic vascular disorders of intestine: Secondary | ICD-10-CM | POA: Diagnosis not present
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
## 2015-08-27 DIAGNOSIS — G40909 Epilepsy, unspecified, not intractable, without status epilepticus: Secondary | ICD-10-CM | POA: Diagnosis not present

## 2015-08-27 DIAGNOSIS — I110 Hypertensive heart disease with heart failure: Secondary | ICD-10-CM | POA: Diagnosis not present

## 2015-08-27 DIAGNOSIS — I509 Heart failure, unspecified: Secondary | ICD-10-CM | POA: Diagnosis not present

## 2015-08-27 DIAGNOSIS — M150 Primary generalized (osteo)arthritis: Secondary | ICD-10-CM | POA: Diagnosis not present

## 2015-08-27 DIAGNOSIS — E119 Type 2 diabetes mellitus without complications: Secondary | ICD-10-CM | POA: Diagnosis not present

## 2015-08-27 DIAGNOSIS — F339 Major depressive disorder, recurrent, unspecified: Secondary | ICD-10-CM | POA: Diagnosis not present

## 2015-08-30 DIAGNOSIS — E119 Type 2 diabetes mellitus without complications: Secondary | ICD-10-CM | POA: Diagnosis not present

## 2015-08-30 DIAGNOSIS — I509 Heart failure, unspecified: Secondary | ICD-10-CM | POA: Diagnosis not present

## 2015-08-30 DIAGNOSIS — G40909 Epilepsy, unspecified, not intractable, without status epilepticus: Secondary | ICD-10-CM | POA: Diagnosis not present

## 2015-08-30 DIAGNOSIS — I110 Hypertensive heart disease with heart failure: Secondary | ICD-10-CM | POA: Diagnosis not present

## 2015-08-30 DIAGNOSIS — M150 Primary generalized (osteo)arthritis: Secondary | ICD-10-CM | POA: Diagnosis not present

## 2015-08-30 DIAGNOSIS — F339 Major depressive disorder, recurrent, unspecified: Secondary | ICD-10-CM | POA: Diagnosis not present

## 2015-09-02 DIAGNOSIS — M150 Primary generalized (osteo)arthritis: Secondary | ICD-10-CM | POA: Diagnosis not present

## 2015-09-02 DIAGNOSIS — I509 Heart failure, unspecified: Secondary | ICD-10-CM | POA: Diagnosis not present

## 2015-09-02 DIAGNOSIS — I110 Hypertensive heart disease with heart failure: Secondary | ICD-10-CM | POA: Diagnosis not present

## 2015-09-02 DIAGNOSIS — G40909 Epilepsy, unspecified, not intractable, without status epilepticus: Secondary | ICD-10-CM | POA: Diagnosis not present

## 2015-09-02 DIAGNOSIS — F339 Major depressive disorder, recurrent, unspecified: Secondary | ICD-10-CM | POA: Diagnosis not present

## 2015-09-02 DIAGNOSIS — E119 Type 2 diabetes mellitus without complications: Secondary | ICD-10-CM | POA: Diagnosis not present

## 2015-09-03 ENCOUNTER — Other Ambulatory Visit: Payer: Self-pay | Admitting: Family Medicine

## 2015-09-03 DIAGNOSIS — M150 Primary generalized (osteo)arthritis: Secondary | ICD-10-CM | POA: Diagnosis not present

## 2015-09-03 DIAGNOSIS — I509 Heart failure, unspecified: Secondary | ICD-10-CM | POA: Diagnosis not present

## 2015-09-03 DIAGNOSIS — E119 Type 2 diabetes mellitus without complications: Secondary | ICD-10-CM | POA: Diagnosis not present

## 2015-09-03 DIAGNOSIS — I110 Hypertensive heart disease with heart failure: Secondary | ICD-10-CM | POA: Diagnosis not present

## 2015-09-03 DIAGNOSIS — G40909 Epilepsy, unspecified, not intractable, without status epilepticus: Secondary | ICD-10-CM | POA: Diagnosis not present

## 2015-09-03 DIAGNOSIS — F339 Major depressive disorder, recurrent, unspecified: Secondary | ICD-10-CM | POA: Diagnosis not present

## 2015-09-04 LAB — CUP PACEART REMOTE DEVICE CHECK: Date Time Interrogation Session: 20161224043553

## 2015-09-06 DIAGNOSIS — E119 Type 2 diabetes mellitus without complications: Secondary | ICD-10-CM | POA: Diagnosis not present

## 2015-09-06 DIAGNOSIS — G40909 Epilepsy, unspecified, not intractable, without status epilepticus: Secondary | ICD-10-CM | POA: Diagnosis not present

## 2015-09-06 DIAGNOSIS — M150 Primary generalized (osteo)arthritis: Secondary | ICD-10-CM | POA: Diagnosis not present

## 2015-09-06 DIAGNOSIS — F339 Major depressive disorder, recurrent, unspecified: Secondary | ICD-10-CM | POA: Diagnosis not present

## 2015-09-06 DIAGNOSIS — I110 Hypertensive heart disease with heart failure: Secondary | ICD-10-CM | POA: Diagnosis not present

## 2015-09-06 DIAGNOSIS — I509 Heart failure, unspecified: Secondary | ICD-10-CM | POA: Diagnosis not present

## 2015-09-09 DIAGNOSIS — E119 Type 2 diabetes mellitus without complications: Secondary | ICD-10-CM | POA: Diagnosis not present

## 2015-09-09 DIAGNOSIS — G40909 Epilepsy, unspecified, not intractable, without status epilepticus: Secondary | ICD-10-CM | POA: Diagnosis not present

## 2015-09-09 DIAGNOSIS — I110 Hypertensive heart disease with heart failure: Secondary | ICD-10-CM | POA: Diagnosis not present

## 2015-09-09 DIAGNOSIS — F339 Major depressive disorder, recurrent, unspecified: Secondary | ICD-10-CM | POA: Diagnosis not present

## 2015-09-09 DIAGNOSIS — M150 Primary generalized (osteo)arthritis: Secondary | ICD-10-CM | POA: Diagnosis not present

## 2015-09-09 DIAGNOSIS — I509 Heart failure, unspecified: Secondary | ICD-10-CM | POA: Diagnosis not present

## 2015-09-13 DIAGNOSIS — M150 Primary generalized (osteo)arthritis: Secondary | ICD-10-CM | POA: Diagnosis not present

## 2015-09-13 DIAGNOSIS — I110 Hypertensive heart disease with heart failure: Secondary | ICD-10-CM | POA: Diagnosis not present

## 2015-09-13 DIAGNOSIS — G40909 Epilepsy, unspecified, not intractable, without status epilepticus: Secondary | ICD-10-CM | POA: Diagnosis not present

## 2015-09-13 DIAGNOSIS — F339 Major depressive disorder, recurrent, unspecified: Secondary | ICD-10-CM | POA: Diagnosis not present

## 2015-09-13 DIAGNOSIS — I509 Heart failure, unspecified: Secondary | ICD-10-CM | POA: Diagnosis not present

## 2015-09-13 DIAGNOSIS — E119 Type 2 diabetes mellitus without complications: Secondary | ICD-10-CM | POA: Diagnosis not present

## 2015-09-16 DIAGNOSIS — G40909 Epilepsy, unspecified, not intractable, without status epilepticus: Secondary | ICD-10-CM | POA: Diagnosis not present

## 2015-09-16 DIAGNOSIS — I110 Hypertensive heart disease with heart failure: Secondary | ICD-10-CM | POA: Diagnosis not present

## 2015-09-16 DIAGNOSIS — I509 Heart failure, unspecified: Secondary | ICD-10-CM | POA: Diagnosis not present

## 2015-09-16 DIAGNOSIS — E119 Type 2 diabetes mellitus without complications: Secondary | ICD-10-CM | POA: Diagnosis not present

## 2015-09-16 DIAGNOSIS — M150 Primary generalized (osteo)arthritis: Secondary | ICD-10-CM | POA: Diagnosis not present

## 2015-09-16 DIAGNOSIS — F339 Major depressive disorder, recurrent, unspecified: Secondary | ICD-10-CM | POA: Diagnosis not present

## 2015-09-20 DIAGNOSIS — I110 Hypertensive heart disease with heart failure: Secondary | ICD-10-CM | POA: Diagnosis not present

## 2015-09-20 DIAGNOSIS — G40909 Epilepsy, unspecified, not intractable, without status epilepticus: Secondary | ICD-10-CM | POA: Diagnosis not present

## 2015-09-20 DIAGNOSIS — M150 Primary generalized (osteo)arthritis: Secondary | ICD-10-CM | POA: Diagnosis not present

## 2015-09-20 DIAGNOSIS — I509 Heart failure, unspecified: Secondary | ICD-10-CM | POA: Diagnosis not present

## 2015-09-20 DIAGNOSIS — E119 Type 2 diabetes mellitus without complications: Secondary | ICD-10-CM | POA: Diagnosis not present

## 2015-09-20 DIAGNOSIS — F339 Major depressive disorder, recurrent, unspecified: Secondary | ICD-10-CM | POA: Diagnosis not present

## 2015-09-22 ENCOUNTER — Ambulatory Visit (INDEPENDENT_AMBULATORY_CARE_PROVIDER_SITE_OTHER): Payer: Medicare Other | Admitting: *Deleted

## 2015-09-22 DIAGNOSIS — R55 Syncope and collapse: Secondary | ICD-10-CM

## 2015-09-22 NOTE — Progress Notes (Signed)
[facility] Summary Report / Loop Recorder 

## 2015-09-23 ENCOUNTER — Encounter: Payer: Self-pay | Admitting: Vascular Surgery

## 2015-09-23 DIAGNOSIS — I509 Heart failure, unspecified: Secondary | ICD-10-CM | POA: Diagnosis not present

## 2015-09-23 DIAGNOSIS — I110 Hypertensive heart disease with heart failure: Secondary | ICD-10-CM | POA: Diagnosis not present

## 2015-09-23 DIAGNOSIS — E119 Type 2 diabetes mellitus without complications: Secondary | ICD-10-CM | POA: Diagnosis not present

## 2015-09-23 DIAGNOSIS — M150 Primary generalized (osteo)arthritis: Secondary | ICD-10-CM | POA: Diagnosis not present

## 2015-09-23 DIAGNOSIS — G40909 Epilepsy, unspecified, not intractable, without status epilepticus: Secondary | ICD-10-CM | POA: Diagnosis not present

## 2015-09-23 DIAGNOSIS — F339 Major depressive disorder, recurrent, unspecified: Secondary | ICD-10-CM | POA: Diagnosis not present

## 2015-09-25 ENCOUNTER — Emergency Department (HOSPITAL_COMMUNITY)
Admission: EM | Admit: 2015-09-25 | Discharge: 2015-09-25 | Disposition: A | Discharge status: Home / Self Care | Payer: Medicare Other | Attending: Emergency Medicine | Admitting: Emergency Medicine

## 2015-09-25 ENCOUNTER — Emergency Department (HOSPITAL_COMMUNITY): Payer: Medicare Other

## 2015-09-25 ENCOUNTER — Encounter (HOSPITAL_COMMUNITY): Payer: Self-pay

## 2015-09-25 DIAGNOSIS — Z9842 Cataract extraction status, left eye: Secondary | ICD-10-CM | POA: Insufficient documentation

## 2015-09-25 DIAGNOSIS — Z7984 Long term (current) use of oral hypoglycemic drugs: Secondary | ICD-10-CM | POA: Insufficient documentation

## 2015-09-25 DIAGNOSIS — I110 Hypertensive heart disease with heart failure: Secondary | ICD-10-CM | POA: Diagnosis not present

## 2015-09-25 DIAGNOSIS — F329 Major depressive disorder, single episode, unspecified: Secondary | ICD-10-CM | POA: Diagnosis not present

## 2015-09-25 DIAGNOSIS — E669 Obesity, unspecified: Secondary | ICD-10-CM | POA: Insufficient documentation

## 2015-09-25 DIAGNOSIS — Z79899 Other long term (current) drug therapy: Secondary | ICD-10-CM | POA: Diagnosis not present

## 2015-09-25 DIAGNOSIS — R110 Nausea: Secondary | ICD-10-CM | POA: Diagnosis not present

## 2015-09-25 DIAGNOSIS — Z8673 Personal history of transient ischemic attack (TIA), and cerebral infarction without residual deficits: Secondary | ICD-10-CM | POA: Insufficient documentation

## 2015-09-25 DIAGNOSIS — M79604 Pain in right leg: Secondary | ICD-10-CM | POA: Diagnosis not present

## 2015-09-25 DIAGNOSIS — R404 Transient alteration of awareness: Secondary | ICD-10-CM | POA: Diagnosis not present

## 2015-09-25 DIAGNOSIS — I509 Heart failure, unspecified: Secondary | ICD-10-CM | POA: Diagnosis not present

## 2015-09-25 DIAGNOSIS — R531 Weakness: Secondary | ICD-10-CM | POA: Diagnosis not present

## 2015-09-25 DIAGNOSIS — Z8572 Personal history of non-Hodgkin lymphomas: Secondary | ICD-10-CM | POA: Insufficient documentation

## 2015-09-25 DIAGNOSIS — R001 Bradycardia, unspecified: Secondary | ICD-10-CM | POA: Diagnosis not present

## 2015-09-25 DIAGNOSIS — I10 Essential (primary) hypertension: Secondary | ICD-10-CM | POA: Insufficient documentation

## 2015-09-25 DIAGNOSIS — I739 Peripheral vascular disease, unspecified: Secondary | ICD-10-CM | POA: Diagnosis not present

## 2015-09-25 DIAGNOSIS — Z9841 Cataract extraction status, right eye: Secondary | ICD-10-CM | POA: Insufficient documentation

## 2015-09-25 DIAGNOSIS — E785 Hyperlipidemia, unspecified: Secondary | ICD-10-CM | POA: Diagnosis not present

## 2015-09-25 DIAGNOSIS — Z90710 Acquired absence of both cervix and uterus: Secondary | ICD-10-CM | POA: Insufficient documentation

## 2015-09-25 DIAGNOSIS — I517 Cardiomegaly: Secondary | ICD-10-CM | POA: Diagnosis not present

## 2015-09-25 DIAGNOSIS — I2510 Atherosclerotic heart disease of native coronary artery without angina pectoris: Secondary | ICD-10-CM | POA: Insufficient documentation

## 2015-09-25 DIAGNOSIS — R0682 Tachypnea, not elsewhere classified: Secondary | ICD-10-CM | POA: Diagnosis not present

## 2015-09-25 DIAGNOSIS — R402421 Glasgow coma scale score 9-12, in the field [EMT or ambulance]: Secondary | ICD-10-CM | POA: Diagnosis not present

## 2015-09-25 DIAGNOSIS — R4182 Altered mental status, unspecified: Secondary | ICD-10-CM | POA: Diagnosis not present

## 2015-09-25 LAB — COMPREHENSIVE METABOLIC PANEL
ALBUMIN: 3.6 g/dL (ref 3.5–5.0)
ALK PHOS: 65 U/L (ref 38–126)
ALT: 13 U/L — ABNORMAL LOW (ref 14–54)
AST: 19 U/L (ref 15–41)
Anion gap: 8 (ref 5–15)
BILIRUBIN TOTAL: 0.3 mg/dL (ref 0.3–1.2)
BUN: 21 mg/dL — AB (ref 6–20)
CALCIUM: 9.3 mg/dL (ref 8.9–10.3)
CO2: 29 mmol/L (ref 22–32)
CREATININE: 0.84 mg/dL (ref 0.44–1.00)
Chloride: 105 mmol/L (ref 101–111)
GFR calc Af Amer: >60 mL/min (ref >60)
GLUCOSE: 109 mg/dL — AB (ref 65–99)
POTASSIUM: 5.2 mmol/L — AB (ref 3.5–5.1)
Sodium: 142 mmol/L (ref 135–145)
TOTAL PROTEIN: 6.6 g/dL (ref 6.5–8.1)

## 2015-09-25 LAB — LACTIC ACID, PLASMA: LACTIC ACID, VENOUS: 1.8 mmol/L (ref 0.5–2.0)

## 2015-09-25 LAB — URINALYSIS, ROUTINE W REFLEX MICROSCOPIC
BILIRUBIN URINE: NEGATIVE
GLUCOSE, UA: NEGATIVE mg/dL
HGB URINE DIPSTICK: NEGATIVE
Ketones, ur: NEGATIVE mg/dL
Leukocytes, UA: NEGATIVE
Nitrite: NEGATIVE
PROTEIN: NEGATIVE mg/dL
Specific Gravity, Urine: 1.015 (ref 1.005–1.030)
pH: 8 (ref 5.0–8.0)

## 2015-09-25 LAB — CBC WITH DIFFERENTIAL/PLATELET
BASOS ABS: 0 10*3/uL (ref 0.0–0.1)
BASOS PCT: 0 %
EOS ABS: 0.1 10*3/uL (ref 0.0–0.7)
EOS PCT: 2 %
HCT: 34.9 % — ABNORMAL LOW (ref 36.0–46.0)
Hemoglobin: 11.1 g/dL — ABNORMAL LOW (ref 12.0–15.0)
Lymphocytes Relative: 23 %
Lymphs Abs: 1.2 10*3/uL (ref 0.7–4.0)
MCH: 28.1 pg (ref 26.0–34.0)
MCHC: 31.8 g/dL (ref 30.0–36.0)
MCV: 88.4 fL (ref 78.0–100.0)
MONO ABS: 0.3 10*3/uL (ref 0.1–1.0)
Monocytes Relative: 6 %
Neutro Abs: 3.6 10*3/uL (ref 1.7–7.7)
Neutrophils Relative %: 69 %
PLATELETS: 172 10*3/uL (ref 150–400)
RBC: 3.95 MIL/uL (ref 3.87–5.11)
RDW: 13.5 % (ref 11.5–15.5)
WBC: 5.2 10*3/uL (ref 4.0–10.5)

## 2015-09-25 LAB — PROTIME-INR
INR: 1.12 (ref 0.00–1.49)
Prothrombin Time: 14.6 seconds (ref 11.6–15.2)

## 2015-09-25 MED ORDER — SODIUM CHLORIDE 0.9 % IV SOLN
INTRAVENOUS | Status: DC
  Administered 2015-09-25: 14:00:00 via INTRAVENOUS

## 2015-09-25 NOTE — ED Notes (Signed)
Per EMS, pt here for possible altered mental status or taking to much pain medication. EMS states when they arrived the pt was not respond to them. Pt was given Narcan 1 mg prior to arrival and is awake and alert now

## 2015-09-25 NOTE — ED Notes (Signed)
Patient is resting comfortably. 

## 2015-09-25 NOTE — ED Notes (Signed)
Waiting for sister to arrive to provide more details

## 2015-09-25 NOTE — Discharge Instructions (Signed)
As discussed, today's episode is likely due to a seizure, though with your history of multiple medical problems, is very important that you follow-up with your neurologist for additional evaluation. Please take all medication as directed, and do not hesitate to return here if you develop new, or concerning changes in the interim.
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
## 2015-09-26 ENCOUNTER — Encounter: Payer: Self-pay | Admitting: Internal Medicine

## 2015-09-27 ENCOUNTER — Telehealth: Payer: Self-pay | Admitting: Vascular Surgery

## 2015-09-27 NOTE — Telephone Encounter (Signed)
[name] is calling to change mom's appointment. Mom had a seizure over the weekend and she needs to see a neurologist and Dr. Oneida Alar. She is trying to make all this happen. Mrs. [name] would like a call back within the next few mins [phone]. Thanks  She called at 11: 12 am.

## 2015-09-27 NOTE — Telephone Encounter (Signed)
Spoke with daughter. She wanted to move pts appt to Wednesday to accommodate her Neuro appt. However, unable to move as CEF will be in surgery. Daughter opted to r.s appt to later in March, dpm

## 2015-09-28 ENCOUNTER — Telehealth: Payer: Self-pay | Admitting: Vascular Surgery

## 2015-09-28 NOTE — Telephone Encounter (Signed)
Spoke with pts daughter, dpm °

## 2015-09-28 NOTE — Telephone Encounter (Signed)
-----   Message from Elam Dutch, MD sent at 09/27/2015  3:27 PM EST ----- Regarding: RE: ? about rescheduling Ok to wait  Ruta Hinds ----- Message -----    From: Gena Fray    Sent: 09/27/2015  12:48 PM      To: Elam Dutch, MD Subject: ? about rescheduling                           Dr Oneida Alar,  [name] was due to see you on 09/30/2015 as a 1 month follow up. She ended up being admitted for seizures and has been referred out to Neuro from her admission. Her daughter rescheduled her appt to 03/30 with you and wants to know if she should continue her food diary until that visit, or should she try to rearrange her neuro appointment to be able to see you sooner than the 30th?  Thanks, Hinton Dyer

## 2015-09-29 ENCOUNTER — Ambulatory Visit (INDEPENDENT_AMBULATORY_CARE_PROVIDER_SITE_OTHER): Payer: Medicare Other | Admitting: Neurology

## 2015-09-29 ENCOUNTER — Encounter: Payer: Self-pay | Admitting: Neurology

## 2015-09-29 VITALS — BP 141/74 | HR 59 | Temp 97.6°F

## 2015-09-29 DIAGNOSIS — G40309 Generalized idiopathic epilepsy and epileptic syndromes, not intractable, without status epilepticus: Secondary | ICD-10-CM | POA: Diagnosis not present

## 2015-09-29 DIAGNOSIS — M6281 Muscle weakness (generalized): Secondary | ICD-10-CM

## 2015-09-29 DIAGNOSIS — M6289 Other specified disorders of muscle: Secondary | ICD-10-CM | POA: Diagnosis not present

## 2015-09-29 MED ORDER — DIVALPROEX SODIUM ER 500 MG PO TB24: 500.0000 mg | ORAL_TABLET | Freq: Every day | ORAL | Status: DC

## 2015-09-29 NOTE — Patient Instructions (Signed)
Remember to drink plenty of fluid, eat healthy meals and do not skip any meals. Try to eat protein with a every meal and eat a healthy snack such as fruit or nuts in between meals. Try to keep a regular sleep-wake schedule and try to exercise daily, particularly in the form of walking, 20-30 minutes a day, if you can.   As far as your medications are concerned, I would like to suggest: Depakote 500mg  at night  As far as diagnostic testing: MRI brain  I would like to see you back in 3 months, sooner if we need to. Please call us with any interim questions, concerns, problems, updates or refill requests.   Our phone number is [phone]. We also have an after hours call service for urgent matters and there is a physician on-call for urgent questions. For any emergencies you know to call 911 or go to the nearest emergency room
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
## 2015-09-30 ENCOUNTER — Ambulatory Visit: Payer: Medicare Other | Admitting: Vascular Surgery

## 2015-09-30 DIAGNOSIS — M150 Primary generalized (osteo)arthritis: Secondary | ICD-10-CM | POA: Diagnosis not present

## 2015-09-30 DIAGNOSIS — F339 Major depressive disorder, recurrent, unspecified: Secondary | ICD-10-CM | POA: Diagnosis not present

## 2015-09-30 DIAGNOSIS — G40909 Epilepsy, unspecified, not intractable, without status epilepticus: Secondary | ICD-10-CM | POA: Diagnosis not present

## 2015-09-30 DIAGNOSIS — I509 Heart failure, unspecified: Secondary | ICD-10-CM | POA: Diagnosis not present

## 2015-09-30 DIAGNOSIS — E119 Type 2 diabetes mellitus without complications: Secondary | ICD-10-CM | POA: Diagnosis not present

## 2015-09-30 DIAGNOSIS — I110 Hypertensive heart disease with heart failure: Secondary | ICD-10-CM | POA: Diagnosis not present

## 2015-10-01 DIAGNOSIS — F339 Major depressive disorder, recurrent, unspecified: Secondary | ICD-10-CM | POA: Diagnosis not present

## 2015-10-01 DIAGNOSIS — I509 Heart failure, unspecified: Secondary | ICD-10-CM | POA: Diagnosis not present

## 2015-10-01 DIAGNOSIS — M150 Primary generalized (osteo)arthritis: Secondary | ICD-10-CM | POA: Diagnosis not present

## 2015-10-01 DIAGNOSIS — I110 Hypertensive heart disease with heart failure: Secondary | ICD-10-CM | POA: Diagnosis not present

## 2015-10-01 DIAGNOSIS — E119 Type 2 diabetes mellitus without complications: Secondary | ICD-10-CM | POA: Diagnosis not present

## 2015-10-01 DIAGNOSIS — G40909 Epilepsy, unspecified, not intractable, without status epilepticus: Secondary | ICD-10-CM | POA: Diagnosis not present

## 2015-10-03 DIAGNOSIS — G40309 Generalized idiopathic epilepsy and epileptic syndromes, not intractable, without status epilepticus: Secondary | ICD-10-CM | POA: Insufficient documentation

## 2015-10-04 LAB — CUP PACEART REMOTE DEVICE CHECK: Date Time Interrogation Session: 20170123050550

## 2015-10-04 NOTE — Progress Notes (Signed)
Carelink summary report received. Battery status OK. Normal device function. No new symptom episodes, tachy episodes, brady, or pause episodes. No new AF episodes. Monthly summary reports and ROV/PRN 

## 2015-10-06 DIAGNOSIS — I110 Hypertensive heart disease with heart failure: Secondary | ICD-10-CM | POA: Diagnosis not present

## 2015-10-06 DIAGNOSIS — F339 Major depressive disorder, recurrent, unspecified: Secondary | ICD-10-CM | POA: Diagnosis not present

## 2015-10-06 DIAGNOSIS — M150 Primary generalized (osteo)arthritis: Secondary | ICD-10-CM | POA: Diagnosis not present

## 2015-10-06 DIAGNOSIS — I509 Heart failure, unspecified: Secondary | ICD-10-CM | POA: Diagnosis not present

## 2015-10-06 DIAGNOSIS — G40909 Epilepsy, unspecified, not intractable, without status epilepticus: Secondary | ICD-10-CM | POA: Diagnosis not present

## 2015-10-06 DIAGNOSIS — E119 Type 2 diabetes mellitus without complications: Secondary | ICD-10-CM | POA: Diagnosis not present

## 2015-10-08 DIAGNOSIS — E119 Type 2 diabetes mellitus without complications: Secondary | ICD-10-CM | POA: Diagnosis not present

## 2015-10-08 DIAGNOSIS — I509 Heart failure, unspecified: Secondary | ICD-10-CM | POA: Diagnosis not present

## 2015-10-08 DIAGNOSIS — M150 Primary generalized (osteo)arthritis: Secondary | ICD-10-CM | POA: Diagnosis not present

## 2015-10-08 DIAGNOSIS — F339 Major depressive disorder, recurrent, unspecified: Secondary | ICD-10-CM | POA: Diagnosis not present

## 2015-10-08 DIAGNOSIS — I110 Hypertensive heart disease with heart failure: Secondary | ICD-10-CM | POA: Diagnosis not present

## 2015-10-08 DIAGNOSIS — G40909 Epilepsy, unspecified, not intractable, without status epilepticus: Secondary | ICD-10-CM | POA: Diagnosis not present

## 2015-10-11 ENCOUNTER — Ambulatory Visit
Admission: RE | Admit: 2015-10-11 | Discharge: 2015-10-11 | Disposition: A | Discharge status: Home / Self Care | Payer: Medicare Other | Source: Ambulatory Visit | Attending: Neurology | Admitting: Neurology

## 2015-10-11 ENCOUNTER — Telehealth: Payer: Self-pay | Admitting: Neurology

## 2015-10-11 DIAGNOSIS — G40309 Generalized idiopathic epilepsy and epileptic syndromes, not intractable, without status epilepticus: Secondary | ICD-10-CM | POA: Diagnosis not present

## 2015-10-11 DIAGNOSIS — M6281 Muscle weakness (generalized): Secondary | ICD-10-CM

## 2015-10-11 DIAGNOSIS — M2689 Other dentofacial anomalies: Secondary | ICD-10-CM | POA: Diagnosis not present

## 2015-10-11 DIAGNOSIS — M6289 Other specified disorders of muscle: Secondary | ICD-10-CM | POA: Diagnosis not present

## 2015-10-11 LAB — CUP PACEART REMOTE DEVICE CHECK: MDC IDC SESS DTM: 20170222050623

## 2015-10-11 NOTE — Progress Notes (Signed)
Carelink summary report received. Battery status OK. Normal device function. No new symptom episodes, tachy episodes, brady, or pause episodes. No new AF episodes. Monthly summary reports and ROV/PRN 

## 2015-10-11 NOTE — Telephone Encounter (Signed)
OK pe Dr Jaynee Eagles to re-certify home health services for next 60 days. He took verbal. He will call back if he needs anything further.

## 2015-10-11 NOTE — Telephone Encounter (Signed)
[name], PT with [facility], is asking an for an order to re-certify home health services for the next 60 days.  He will re-evaluate her Friday. The patient had a seizure a couple of weeks ago and she has regressed and no walking well now.  Please call.

## 2015-10-13 ENCOUNTER — Telehealth: Payer: Self-pay | Admitting: *Deleted

## 2015-10-13 NOTE — Telephone Encounter (Signed)
-----   Message from Melvenia Beam, MD sent at 10/13/2015 12:27 PM EDT ----- Findings in her brain have slightly progressed since 2012 in terms of atrophy and white matter changes. She has some volume loss of the brain and white matter changes. Both can be seen with aging and also with vascular risk factors. But no new strokes or lesions or anything new. thanks

## 2015-10-13 NOTE — Telephone Encounter (Signed)
Faxed signed orders for re-certification for home health back to [place]. Received confirmation. Fax: [phone]. Sent copy to MR.

## 2015-10-13 NOTE — Telephone Encounter (Signed)
Called and spoke to daughter, Cornelia about MRI brain per Dr Jaynee Eagles note. She verbalized understanding.

## 2015-10-15 DIAGNOSIS — I110 Hypertensive heart disease with heart failure: Secondary | ICD-10-CM | POA: Diagnosis not present

## 2015-10-15 DIAGNOSIS — G40909 Epilepsy, unspecified, not intractable, without status epilepticus: Secondary | ICD-10-CM | POA: Diagnosis not present

## 2015-10-15 DIAGNOSIS — E119 Type 2 diabetes mellitus without complications: Secondary | ICD-10-CM | POA: Diagnosis not present

## 2015-10-15 DIAGNOSIS — I509 Heart failure, unspecified: Secondary | ICD-10-CM | POA: Diagnosis not present

## 2015-10-15 DIAGNOSIS — M150 Primary generalized (osteo)arthritis: Secondary | ICD-10-CM | POA: Diagnosis not present

## 2015-10-15 DIAGNOSIS — F339 Major depressive disorder, recurrent, unspecified: Secondary | ICD-10-CM | POA: Diagnosis not present

## 2015-10-18 ENCOUNTER — Telehealth: Payer: Self-pay | Admitting: Neurology

## 2015-10-18 NOTE — Telephone Encounter (Signed)
Amy with [facility] needs a verbal order: 2 x week for 4 weeks, and 1 x a week for 1 week for PT. Please call [phone]

## 2015-10-18 NOTE — Telephone Encounter (Signed)
Dr Jaynee Eagles- FYI Called Amy. Gave verbal ok for PT 2x/week for 4 weeks and 1x/week for 1 week. Ok per Dr Jaynee Eagles. She verbalized understanding.

## 2015-10-18 NOTE — Telephone Encounter (Signed)
Thank you :)

## 2015-10-20 ENCOUNTER — Encounter: Payer: Self-pay | Admitting: Vascular Surgery

## 2015-10-22 ENCOUNTER — Ambulatory Visit (INDEPENDENT_AMBULATORY_CARE_PROVIDER_SITE_OTHER): Payer: Medicare Other | Admitting: *Deleted

## 2015-10-22 DIAGNOSIS — R55 Syncope and collapse: Secondary | ICD-10-CM | POA: Diagnosis not present

## 2015-10-22 NOTE — Progress Notes (Signed)
[facility] Summary Report / Loop Recorder 

## 2015-10-28 ENCOUNTER — Encounter: Payer: Self-pay | Admitting: Vascular Surgery

## 2015-10-28 ENCOUNTER — Ambulatory Visit (INDEPENDENT_AMBULATORY_CARE_PROVIDER_SITE_OTHER): Payer: Medicare Other | Admitting: Vascular Surgery

## 2015-10-28 VITALS — BP 133/53 | HR 64 | Temp 97.1°F | Resp 14 | Ht 60.0 in | Wt 145.0 lb

## 2015-10-28 DIAGNOSIS — E119 Type 2 diabetes mellitus without complications: Secondary | ICD-10-CM | POA: Diagnosis not present

## 2015-10-28 DIAGNOSIS — R634 Abnormal weight loss: Secondary | ICD-10-CM

## 2015-10-28 DIAGNOSIS — Z961 Presence of intraocular lens: Secondary | ICD-10-CM | POA: Diagnosis not present

## 2015-10-28 DIAGNOSIS — Z0100 Encounter for examination of eyes and vision without abnormal findings: Secondary | ICD-10-CM | POA: Diagnosis not present

## 2015-10-28 LAB — HM DIABETES EYE EXAM

## 2015-10-28 NOTE — Progress Notes (Signed)
[facility]  History of present illness: patient returns today for follow-up as she has been previously evaluated for possible chronic mesenteric ischemia. She presented a diary today to me of the food that she has eaten over the last few weeks. She seems to be eating okay and has no evidence of food fear. Her weight is essentially the same as when I saw her about 6 weeks ago.   Objective 133/53 64 97.1 F (36.2 C) (Oral) 14 96% Weight 145 pounds Abdomen: Soft nontender no bruit  Data:CT scan of the abdomen and pelvis is reviewed. Proximal celiac and spare mesenteric arteries are widely patent. There is heavy calcification of the mid superior mesenteric artery. It is difficult to determine if this is actually flow-limiting stenosis.   The patient had a mesenteric duplex ultrasound today. This did show some increased velocities in the midsegment of the superior mesenteric artery suggesting greater than 70% narrowing. Velocity was as high as 354 cm/s.CT scan of the abdomen and pelvis is reviewed. Proximal celiac and superior mesenteric arteries are widely patent. There is heavy calcification of the mid superior mesenteric artery. It is difficult to determine if this is actually flow-limiting stenosis.   Assessment/Planning: Patient with some evidence of mesenteric occlusive disease by CT scan and mesenteric duplex. However she seems to be eating okay without any pain when eating and no real evidence of food fear. I believe as long as she is not losing weight or having abdominal pain we can continue to observe this. Spoke to her daughter at length regarding this today. We are in agreement that no intervention currently is necessary with her symptoms. She will follow-up on as-needed basis with [place] if she starts to develop abdominal pain or weight loss. Otherwise the next time she returns we will recheck her weight make sure she is not continuing to lose  weight.  [name] 10/28/2015 10:17 AM --

## 2015-11-03 ENCOUNTER — Telehealth: Payer: Self-pay | Admitting: Neurology

## 2015-11-03 NOTE — Telephone Encounter (Signed)
Called Danny back. Fall cccurred prior to [facility] arriving for treatment. Pt was very fatigued. She has home health aid that sits with her from 9-3 pm. She was not in the bathroom with her. Pt was in bathroom by herself. Pt trying to reach down in vanity for toothbrush when she fell. She was uninjured. Tammy (aid) heard fall from kitchen. She was cooking. Aid able to get pt off floor safely. When Danny arrived, pt walking out of bathroom but was very weak. They did therapy from sitting position that day. Was advised to rest that day and advised aid to stay close to pt. He ordered pt gait belt for transfer. This is the second time this has occurred. He spoke to his boss because he is only going to be there for a couple more weeks. He needs to teach aid exercises to maintain lower extremity strength. He is going to fax over order for OT to come and evaluate pt. He has to speak to OT first and will send over order after. Advised I will let Dr Jaynee Eagles know. He verbalized understanding.

## 2015-11-03 NOTE — Telephone Encounter (Signed)
[name], PT/[facility] called to advise, "patient had fall in bathroom on 11/01/15, was not injured, home agency [facility] was able to help her up". Please call [name]. Ok to leave message if you get voicemail.

## 2015-11-04 ENCOUNTER — Other Ambulatory Visit: Payer: Self-pay | Admitting: Neurology

## 2015-11-04 DIAGNOSIS — Z79899 Other long term (current) drug therapy: Secondary | ICD-10-CM

## 2015-11-04 NOTE — Telephone Encounter (Signed)
Dr Jaynee Eagles- FYI Called and spoke to daughter, Dierdre. She had just got off the phone with the caregiver. Gave lab hours here at our office. They will plan on coming Monday for lab work. Advised I will let Dr Jaynee Eagles know. Also relayed what I spoke to Rives, PT about yesterday. She verbalized understanding.

## 2015-11-04 NOTE — Telephone Encounter (Signed)
Spoke to patient's caregiver. She slipped the other day. She is fatigued, tired and drowsy and feels weak. Asked her to come in for labs. Will check cbc,cmp,depakote and tegretol levels. Do we have the daughter's number so we can try and call her as well? thanks

## 2015-11-08 ENCOUNTER — Other Ambulatory Visit (INDEPENDENT_AMBULATORY_CARE_PROVIDER_SITE_OTHER): Payer: Self-pay

## 2015-11-08 ENCOUNTER — Other Ambulatory Visit: Payer: Self-pay | Admitting: *Deleted

## 2015-11-08 DIAGNOSIS — Z0289 Encounter for other administrative examinations: Secondary | ICD-10-CM

## 2015-11-08 DIAGNOSIS — Z79899 Other long term (current) drug therapy: Secondary | ICD-10-CM

## 2015-11-09 ENCOUNTER — Telehealth: Payer: Self-pay | Admitting: *Deleted

## 2015-11-09 ENCOUNTER — Encounter: Payer: Self-pay | Admitting: Family Medicine

## 2015-11-09 LAB — COMPREHENSIVE METABOLIC PANEL
ALBUMIN: 3.7 g/dL (ref 3.5–4.7)
ALT: 9 IU/L (ref 0–32)
AST: 14 IU/L (ref 0–40)
Albumin/Globulin Ratio: 1.5 (ref 1.2–2.2)
Alkaline Phosphatase: 63 IU/L (ref 39–117)
BUN / CREAT RATIO: 24 (ref 12–28)
BUN: 16 mg/dL (ref 8–27)
Bilirubin Total: <0.2 mg/dL (ref 0.0–1.2)
CALCIUM: 9.6 mg/dL (ref 8.7–10.3)
CO2: 25 mmol/L (ref 18–29)
CREATININE: 0.68 mg/dL (ref 0.57–1.00)
Chloride: 103 mmol/L (ref 96–106)
GFR, EST AFRICAN AMERICAN: 92 mL/min/{1.73_m2} (ref >59)
GFR, EST NON AFRICAN AMERICAN: 80 mL/min/{1.73_m2} (ref >59)
GLOBULIN, TOTAL: 2.4 g/dL (ref 1.5–4.5)
Glucose: 98 mg/dL (ref 65–99)
Potassium: 5.3 mmol/L — ABNORMAL HIGH (ref 3.5–5.2)
SODIUM: 146 mmol/L — AB (ref 134–144)
TOTAL PROTEIN: 6.1 g/dL (ref 6.0–8.5)

## 2015-11-09 LAB — CBC
HEMATOCRIT: 33 % — AB (ref 34.0–46.6)
HEMOGLOBIN: 10.4 g/dL — AB (ref 11.1–15.9)
MCH: 26.6 pg (ref 26.6–33.0)
MCHC: 31.5 g/dL (ref 31.5–35.7)
MCV: 84 fL (ref 79–97)
Platelets: 200 10*3/uL (ref 150–379)
RBC: 3.91 x10E6/uL (ref 3.77–5.28)
RDW: 14.7 % (ref 12.3–15.4)
WBC: 5.3 10*3/uL (ref 3.4–10.8)

## 2015-11-09 LAB — VALPROIC ACID LEVEL: Valproic Acid Lvl: 13 ug/mL — ABNORMAL LOW (ref 50–100)

## 2015-11-09 LAB — CARBAMAZEPINE LEVEL, TOTAL: Carbamazepine (Tegretol), S: 3.7 ug/mL — ABNORMAL LOW (ref 4.0–12.0)

## 2015-11-09 NOTE — Telephone Encounter (Signed)
-----   Message from Melvenia Beam, MD sent at 11/09/2015 11:19 AM EDT ----- She is mildly dehydrated. She should be encouraged to drink more fluids. Dehydration can cause kidney injury and puts her at risk for infections like UTIs. She is also anemic but this appears chronic. Sill they should follow up with primary care. Her tegretol and depakote levels are not elevated which is good, I wanted to make sure she is not toxic on those meds which can cause sedation. Her depakote level was low, just ensure she is compliant with the depakote nightly.

## 2015-11-09 NOTE — Telephone Encounter (Signed)
Called and spoke to daughter Kathaleen Bury) about lab results per Dr Jaynee Eagles note. She verbalized understanding.

## 2015-11-12 ENCOUNTER — Telehealth: Payer: Self-pay | Admitting: Neurology

## 2015-11-12 NOTE — Telephone Encounter (Signed)
Michelle/[facility] [phone] called to advise she completed OT evaluation last night, requests verbal order for OT to continue.

## 2015-11-12 NOTE — Telephone Encounter (Signed)
Dr Jaynee Eagles- [facility] back. Gave verbal to continue OT per Dr Jaynee Eagles. She verbalized understanding.

## 2015-11-16 ENCOUNTER — Encounter: Payer: Self-pay | Admitting: *Deleted

## 2015-11-16 NOTE — Progress Notes (Signed)
Faxed completed form from Dr Jaynee Eagles back to [facility]. Order date: 11/04/15. HHOT to eval for ADL's any day of week of 11/08/15. Fax: [phone]. Received confirmation.

## 2015-11-17 ENCOUNTER — Telehealth: Payer: Self-pay | Admitting: Family Medicine

## 2015-11-17 NOTE — Telephone Encounter (Signed)
Pt having visit 4/20 with Dr. Maudie Mercury and would like to discuss personal things about her mother that she (daughter) did not want to discuss with Dr. Maudie Mercury in front her mother.

## 2015-11-17 NOTE — Telephone Encounter (Signed)
I called Mrs [name] and she stated she wanted to let Dr Maudie Mercury know she is concerned that the pt may need to go into a nursing home as she feels the pt has gone down since her husband passed last year.  States she is now in a wheelchair, takes a long time to even take 2-3 steps walking and she is seeing a neurologist for questionable seizures.  She stated she wanted to see if Dr Maudie Mercury noticed anything different with the pt at the appt tomorrow and did not want to discuss this in front of the pt.

## 2015-11-18 ENCOUNTER — Encounter: Payer: Self-pay | Admitting: Family Medicine

## 2015-11-18 ENCOUNTER — Ambulatory Visit (INDEPENDENT_AMBULATORY_CARE_PROVIDER_SITE_OTHER): Payer: Medicare Other | Admitting: Family Medicine

## 2015-11-18 ENCOUNTER — Ambulatory Visit: Payer: Medicare Other | Admitting: Neurology

## 2015-11-18 VITALS — BP 118/52 | HR 58 | Temp 98.5°F | Ht 60.0 in

## 2015-11-18 DIAGNOSIS — M069 Rheumatoid arthritis, unspecified: Secondary | ICD-10-CM

## 2015-11-18 DIAGNOSIS — I739 Peripheral vascular disease, unspecified: Secondary | ICD-10-CM

## 2015-11-18 DIAGNOSIS — D638 Anemia in other chronic diseases classified elsewhere: Secondary | ICD-10-CM

## 2015-11-18 DIAGNOSIS — I2510 Atherosclerotic heart disease of native coronary artery without angina pectoris: Secondary | ICD-10-CM

## 2015-11-18 DIAGNOSIS — R55 Syncope and collapse: Secondary | ICD-10-CM | POA: Diagnosis not present

## 2015-11-18 DIAGNOSIS — R569 Unspecified convulsions: Secondary | ICD-10-CM

## 2015-11-18 DIAGNOSIS — G40309 Generalized idiopathic epilepsy and epileptic syndromes, not intractable, without status epilepticus: Secondary | ICD-10-CM

## 2015-11-18 DIAGNOSIS — I5032 Chronic diastolic (congestive) heart failure: Secondary | ICD-10-CM

## 2015-11-18 DIAGNOSIS — E785 Hyperlipidemia, unspecified: Secondary | ICD-10-CM

## 2015-11-18 DIAGNOSIS — I10 Essential (primary) hypertension: Secondary | ICD-10-CM

## 2015-11-18 DIAGNOSIS — Z89511 Acquired absence of right leg below knee: Secondary | ICD-10-CM | POA: Diagnosis not present

## 2015-11-18 DIAGNOSIS — E1151 Type 2 diabetes mellitus with diabetic peripheral angiopathy without gangrene: Secondary | ICD-10-CM

## 2015-11-18 LAB — POCT GLYCOSYLATED HEMOGLOBIN (HGB A1C): Hemoglobin A1C: 5.5
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
## 2015-11-18 NOTE — Progress Notes (Signed)
Pre visit review using our clinic review tool, if applicable. No additional management support is needed unless otherwise documented below in the visit note. 

## 2015-11-18 NOTE — Patient Instructions (Signed)
Before you leave: -Hemoglobin A1c -Follow up in 4-6 months  We recommend the following healthy lifestyle measures: - eat a healthy whole foods diet consisting of regular small meals composed of vegetables, fruits, beans, nuts, seeds, healthy meats such as white chicken and fish and whole grains.  - avoid sweets, white starchy foods, fried foods, fast food, processed foods, sodas, red meet and other fattening foods.  - Continue work with the physical therapist per neurology orders for strength

## 2015-11-22 ENCOUNTER — Ambulatory Visit (INDEPENDENT_AMBULATORY_CARE_PROVIDER_SITE_OTHER): Payer: Medicare Other | Admitting: *Deleted

## 2015-11-22 DIAGNOSIS — R55 Syncope and collapse: Secondary | ICD-10-CM

## 2015-11-22 NOTE — Progress Notes (Signed)
[facility] Summary Report / Loop Recorder 

## 2015-11-24 ENCOUNTER — Telehealth: Payer: Self-pay | Admitting: *Deleted

## 2015-11-24 NOTE — Telephone Encounter (Signed)
Called [name] back. Advised duplicate still have Dr Jannifer Franklin' name on second page. This needs to still be corrected to Dr Jaynee Eagles. He is going to call and let Pamala Hurry know to refax with corrections. Advised to let Pamala Hurry know Dr Jaynee Eagles out of the office this week and will be back next week to sign paperwork. He verbalized understanding.

## 2015-11-24 NOTE — Telephone Encounter (Signed)
Called and asked to speak to [facility] re: pt and VO given on 11/03/15 for OT to eval for ADL's. Dated 11/04/15. I faxed signed paperwork by Dr Jaynee Eagles on 11/16/15. Advised I received duplicate yesterday and wanted to verify if he received fax back on 11/16/15, or if I need to fax again. I spoke with Terrence Dupont. Danny not available. He works out in Hospital doctor per [employer]. She took number and is going to verify with him and have him call me.

## 2015-11-24 NOTE — Telephone Encounter (Signed)
Danny/[facility] called back, he sts a that duplicate order should have Dr Cathren Laine name printed at the top. And that was his companies correction and to please fax it again.

## 2015-11-24 NOTE — Telephone Encounter (Signed)
Danny A. Called office back. He stated he is going to double check and let me know. States Dr Jannifer Franklin name is on form and not Dr Jaynee Eagles.  States he will contact Pamala Hurry- Tourist information centre manager and inform her Dr Jaynee Eagles is pt MD not Dr Jannifer Franklin. If she needs anything further, she will call us.

## 2015-11-30 DIAGNOSIS — Z7952 Long term (current) use of systemic steroids: Secondary | ICD-10-CM | POA: Diagnosis not present

## 2015-11-30 DIAGNOSIS — M1990 Unspecified osteoarthritis, unspecified site: Secondary | ICD-10-CM | POA: Diagnosis not present

## 2015-11-30 DIAGNOSIS — Z13820 Encounter for screening for osteoporosis: Secondary | ICD-10-CM | POA: Diagnosis not present

## 2015-12-01 ENCOUNTER — Other Ambulatory Visit: Payer: Self-pay | Admitting: Family Medicine

## 2015-12-07 ENCOUNTER — Emergency Department (HOSPITAL_COMMUNITY)
Admission: EM | Admit: 2015-12-07 | Discharge: 2015-12-07 | Disposition: A | Discharge status: Home / Self Care | Payer: Medicare Other | Attending: Emergency Medicine | Admitting: Emergency Medicine

## 2015-12-07 ENCOUNTER — Encounter (HOSPITAL_COMMUNITY): Payer: Self-pay

## 2015-12-07 DIAGNOSIS — E785 Hyperlipidemia, unspecified: Secondary | ICD-10-CM | POA: Diagnosis not present

## 2015-12-07 DIAGNOSIS — Z7982 Long term (current) use of aspirin: Secondary | ICD-10-CM | POA: Diagnosis not present

## 2015-12-07 DIAGNOSIS — E669 Obesity, unspecified: Secondary | ICD-10-CM | POA: Diagnosis not present

## 2015-12-07 DIAGNOSIS — R569 Unspecified convulsions: Secondary | ICD-10-CM

## 2015-12-07 DIAGNOSIS — I110 Hypertensive heart disease with heart failure: Secondary | ICD-10-CM | POA: Insufficient documentation

## 2015-12-07 DIAGNOSIS — Z7984 Long term (current) use of oral hypoglycemic drugs: Secondary | ICD-10-CM | POA: Diagnosis not present

## 2015-12-07 DIAGNOSIS — I2510 Atherosclerotic heart disease of native coronary artery without angina pectoris: Secondary | ICD-10-CM | POA: Diagnosis not present

## 2015-12-07 DIAGNOSIS — M1990 Unspecified osteoarthritis, unspecified site: Secondary | ICD-10-CM | POA: Insufficient documentation

## 2015-12-07 DIAGNOSIS — R531 Weakness: Secondary | ICD-10-CM | POA: Diagnosis not present

## 2015-12-07 DIAGNOSIS — Z79899 Other long term (current) drug therapy: Secondary | ICD-10-CM | POA: Insufficient documentation

## 2015-12-07 DIAGNOSIS — G40909 Epilepsy, unspecified, not intractable, without status epilepticus: Secondary | ICD-10-CM | POA: Diagnosis present

## 2015-12-07 DIAGNOSIS — F329 Major depressive disorder, single episode, unspecified: Secondary | ICD-10-CM | POA: Insufficient documentation

## 2015-12-07 DIAGNOSIS — I509 Heart failure, unspecified: Secondary | ICD-10-CM | POA: Diagnosis not present

## 2015-12-07 DIAGNOSIS — Z8673 Personal history of transient ischemic attack (TIA), and cerebral infarction without residual deficits: Secondary | ICD-10-CM | POA: Diagnosis not present

## 2015-12-07 DIAGNOSIS — E1151 Type 2 diabetes mellitus with diabetic peripheral angiopathy without gangrene: Secondary | ICD-10-CM | POA: Insufficient documentation

## 2015-12-07 LAB — CBC
HCT: 33.7 % — ABNORMAL LOW (ref 36.0–46.0)
HEMOGLOBIN: 10.8 g/dL — AB (ref 12.0–15.0)
MCH: 28.6 pg (ref 26.0–34.0)
MCHC: 32 g/dL (ref 30.0–36.0)
MCV: 89.2 fL (ref 78.0–100.0)
Platelets: 161 10*3/uL (ref 150–400)
RBC: 3.78 MIL/uL — AB (ref 3.87–5.11)
RDW: 13.9 % (ref 11.5–15.5)
WBC: 5.2 10*3/uL (ref 4.0–10.5)

## 2015-12-07 LAB — BASIC METABOLIC PANEL
ANION GAP: 9 (ref 5–15)
BUN: 15 mg/dL (ref 6–20)
CALCIUM: 9.1 mg/dL (ref 8.9–10.3)
CHLORIDE: 104 mmol/L (ref 101–111)
CO2: 31 mmol/L (ref 22–32)
Creatinine, Ser: 0.7 mg/dL (ref 0.44–1.00)
GFR calc non Af Amer: >60 mL/min (ref >60)
GLUCOSE: 128 mg/dL — AB (ref 65–99)
Potassium: 4.2 mmol/L (ref 3.5–5.1)
Sodium: 144 mmol/L (ref 135–145)

## 2015-12-07 LAB — VALPROIC ACID LEVEL: VALPROIC ACID LVL: 29 ug/mL — AB (ref 50.0–100.0)

## 2015-12-07 LAB — CARBAMAZEPINE LEVEL, TOTAL: Carbamazepine Lvl: 4.5 ug/mL (ref 4.0–12.0)

## 2015-12-07 MED ORDER — DIVALPROEX SODIUM 250 MG PO DR TAB
500.0000 mg | DELAYED_RELEASE_TABLET | Freq: Once | ORAL | Status: AC
  Administered 2015-12-07: 500 mg via ORAL
  Filled 2015-12-07: qty 2

## 2015-12-07 MED ORDER — DIVALPROEX SODIUM ER 500 MG PO TB24: 500.0000 mg | ORAL_TABLET | Freq: Two times a day (BID) | ORAL | Status: DC
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
## 2015-12-07 NOTE — Discharge Instructions (Signed)
Return to the emergency department for seizures lasting more than 5 minutes or multiple seizures in one day

## 2015-12-07 NOTE — ED Notes (Signed)
Pt reports has history of seizures and had a seizure Friday and today.  Reports has been taking her seizure medication.  Pt reports she started a new medication 2 weeks ago.  Pt isn't sure what the new medication is or what it is for.    CBG 194 per ems.

## 2015-12-08 ENCOUNTER — Telehealth: Payer: Self-pay | Admitting: *Deleted

## 2015-12-08 NOTE — Telephone Encounter (Signed)
Called and spoke to daughter Dierdre. Made f/u per Dr Jaynee Eagles request on 12/16/15,  Check in 345p. [place] 12/08/15. She verbalized understanding.

## 2015-12-15 ENCOUNTER — Telehealth: Payer: Self-pay | Admitting: Neurology

## 2015-12-15 NOTE — Telephone Encounter (Signed)
Spoke to Dr Jaynee Eagles. Clarified message. She is aware.

## 2015-12-15 NOTE — Telephone Encounter (Signed)
I am not aware of any place so sorry ! She should check with her primary care. Does she have a follow up scheduled with me for the seizures?

## 2015-12-15 NOTE — Telephone Encounter (Signed)
Dr Jaynee Eagles- [facility]. Pt has appt tomorrow.   Called daughter back. She stated she was talking with her sister this past Sunday. They were thinking that since pt had her last seizure, she is losing strength in her legs/arms. Patient is c/o arm pain. Daughter states that in order to get her from point A to B, brother has to physically lift her up. For instance, from bed to chair, or chair back to bed. Pt cannot do a lot on her own. She stated pt cognition/being able to process information has slowed. She is wondering if Dr Jaynee Eagles can recommend some rehab for her where she can go for 20 days and have continuous therapy to build up her strength. She wanted to call and let us know because she did not want to discuss in front of her mother at appointment tomorrow and upset her. She is wondering if Dr Jaynee Eagles thinks it is warranted, she is wondering if she can recommend to the pt while in the office as well. I advised I will send message to Dr Jaynee Eagles so she is aware prior to their appt tomorrow. She verbalized understanding and appreciation.

## 2015-12-15 NOTE — Telephone Encounter (Signed)
Pt's daughter called said pt has appt tomorrow, she has a question to ask but cannot ask in from of the pt. She did not want to go into detail please call

## 2015-12-16 ENCOUNTER — Encounter: Payer: Self-pay | Admitting: Neurology

## 2015-12-16 ENCOUNTER — Ambulatory Visit (INDEPENDENT_AMBULATORY_CARE_PROVIDER_SITE_OTHER): Payer: Medicare Other | Admitting: Neurology

## 2015-12-16 VITALS — BP 146/72 | HR 80 | Temp 97.2°F

## 2015-12-16 DIAGNOSIS — I2510 Atherosclerotic heart disease of native coronary artery without angina pectoris: Secondary | ICD-10-CM | POA: Diagnosis not present

## 2015-12-16 DIAGNOSIS — M6281 Muscle weakness (generalized): Secondary | ICD-10-CM | POA: Diagnosis not present

## 2015-12-16 DIAGNOSIS — F05 Delirium due to known physiological condition: Secondary | ICD-10-CM

## 2015-12-16 DIAGNOSIS — R410 Disorientation, unspecified: Secondary | ICD-10-CM

## 2015-12-16 NOTE — Patient Instructions (Signed)
Remember to drink plenty of fluid, eat healthy meals and do not skip any meals. Try to eat protein with a every meal and eat a healthy snack such as fruit or nuts in between meals. Try to keep a regular sleep-wake schedule and try to exercise daily, particularly in the form of walking, 20-30 minutes a day, if you can.   As far as your medications are concerned, I would like to suggest: continue Depakote 500mg  ER twice daily  As far as diagnostic testing: labs, EMG/ncs  I would like to see you back for an emg/ncs, sooner if we need to. Please call us with any interim questions, concerns, problems, updates or refill requests.   Our phone number is [phone]. We also have an after hours call service for urgent matters and there is a physician on-call for urgent questions. For any emergencies you know to call 911 or go to the nearest emergency room
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
## 2015-12-21 ENCOUNTER — Ambulatory Visit (INDEPENDENT_AMBULATORY_CARE_PROVIDER_SITE_OTHER): Payer: Medicare Other | Admitting: *Deleted

## 2015-12-21 DIAGNOSIS — R55 Syncope and collapse: Secondary | ICD-10-CM

## 2015-12-21 NOTE — Progress Notes (Signed)
[facility] Summary Report / Loop Recorder 

## 2015-12-23 ENCOUNTER — Telehealth: Payer: Self-pay | Admitting: Cardiology

## 2015-12-23 ENCOUNTER — Telehealth: Payer: Self-pay | Admitting: Neurology

## 2015-12-23 ENCOUNTER — Other Ambulatory Visit: Payer: Self-pay | Admitting: Neurology

## 2015-12-23 MED ORDER — DIVALPROEX SODIUM ER 500 MG PO TB24: 500.0000 mg | ORAL_TABLET | Freq: Two times a day (BID) | ORAL | Status: DC

## 2015-12-23 NOTE — Telephone Encounter (Signed)
Pt daughter asked if pt should continue ASA and Plavix as prescribed? Informed to continue until changed by the provider.

## 2015-12-23 NOTE — Telephone Encounter (Signed)
Patient requesting refill of divalproex (DEPAKOTE ER) 500 MG 24 hr tablet . Mother said needs new RX with new qty and directions. Pt hs been out of medication since Tuesday or Wednesday. Pharmacy: [facility]

## 2015-12-23 NOTE — Telephone Encounter (Signed)
Patient's daughter states that Neurologist is asking if patient needs to be taking ASA and Plavix./ tg

## 2015-12-23 NOTE — Telephone Encounter (Signed)
Dr Jaynee Eagles- please advise  Tried calling back. Looks like pt was given printed rx from ED on 12/07/15.

## 2015-12-25 LAB — CUP PACEART REMOTE DEVICE CHECK: Date Time Interrogation Session: 20170324050843

## 2015-12-25 NOTE — Progress Notes (Signed)
Carelink summary report received. Battery status OK. Normal device function. No new symptom episodes, tachy episodes, brady episodes. No new AF episodes. 1 pause episode, undersensing. Monthly summary reports and ROV/PRN

## 2015-12-27 LAB — CUP PACEART REMOTE DEVICE CHECK: Date Time Interrogation Session: 20170423053528

## 2015-12-27 NOTE — Progress Notes (Signed)
Carelink summary report received. Battery status OK. Normal device function. No new symptom episodes, tachy episodes, brady, or pause episodes. No new AF episodes. Monthly summary reports and ROV/PRN 

## 2015-12-29 ENCOUNTER — Telehealth: Payer: Self-pay | Admitting: *Deleted

## 2015-12-29 NOTE — Telephone Encounter (Signed)
Called and spoke to daughter, Diedre about lab work per Dr Jaynee Eagles note. Advised we are still waiting on one lab to come back. Dr Jaynee Eagles can speak to them further at EMG/NCS on 01/12/16.  She verbalized understanding.

## 2015-12-29 NOTE — Telephone Encounter (Signed)
-----   Message from Melvenia Beam, MD sent at 12/28/2015  6:55 PM EDT ----- Labs normal, still awaiting just one. No evidence of muscle breakdown or muscle disease or cause for her weakness found. We can discuss at her emg/ncs. She is however anemic which is chronic thanks

## 2015-12-30 ENCOUNTER — Ambulatory Visit: Payer: Medicare Other | Admitting: Neurology

## 2015-12-30 ENCOUNTER — Ambulatory Visit: Payer: Medicare Other | Admitting: Family Medicine

## 2016-01-03 LAB — COMPREHENSIVE METABOLIC PANEL
ALBUMIN: 3.6 g/dL (ref 3.5–4.7)
ALK PHOS: 61 IU/L (ref 39–117)
ALT: 11 IU/L (ref 0–32)
AST: 12 IU/L (ref 0–40)
Albumin/Globulin Ratio: 1.4 (ref 1.2–2.2)
BUN / CREAT RATIO: 23 (ref 12–28)
BUN: 14 mg/dL (ref 8–27)
CHLORIDE: 99 mmol/L (ref 96–106)
CO2: 24 mmol/L (ref 18–29)
CREATININE: 0.62 mg/dL (ref 0.57–1.00)
Calcium: 9.2 mg/dL (ref 8.7–10.3)
GFR calc non Af Amer: 82 mL/min/{1.73_m2} (ref >59)
GFR, EST AFRICAN AMERICAN: 95 mL/min/{1.73_m2} (ref >59)
GLOBULIN, TOTAL: 2.5 g/dL (ref 1.5–4.5)
GLUCOSE: 93 mg/dL (ref 65–99)
Potassium: 5.2 mmol/L (ref 3.5–5.2)
SODIUM: 142 mmol/L (ref 134–144)
TOTAL PROTEIN: 6.1 g/dL (ref 6.0–8.5)

## 2016-01-03 LAB — ACETYLCHOLINE RECEPTOR, BINDING: AChR Binding Ab, Serum: <0.03 nmol/L (ref 0.00–0.24)

## 2016-01-03 LAB — CBC
HEMATOCRIT: 33.9 % — AB (ref 34.0–46.6)
Hemoglobin: 10.6 g/dL — ABNORMAL LOW (ref 11.1–15.9)
MCH: 27.6 pg (ref 26.6–33.0)
MCHC: 31.3 g/dL — AB (ref 31.5–35.7)
MCV: 88 fL (ref 79–97)
PLATELETS: 207 10*3/uL (ref 150–379)
RBC: 3.84 x10E6/uL (ref 3.77–5.28)
RDW: 14.4 % (ref 12.3–15.4)
WBC: 5.3 10*3/uL (ref 3.4–10.8)

## 2016-01-03 LAB — ACETYLCHOLINE RECEPTOR, BLOCKING: Acetylchol Block Ab: 20 % (ref 0–25)

## 2016-01-03 LAB — ACETYLCHOLINE RECEPTOR, MODULATING: Acetylcholine Modulat Ab: <12 % (ref 0–20)

## 2016-01-03 LAB — VGCC ANTIBODY: VGCC ANTIBODY: NEGATIVE

## 2016-01-03 LAB — CK: Total CK: 26 U/L (ref 24–173)

## 2016-01-12 ENCOUNTER — Ambulatory Visit (INDEPENDENT_AMBULATORY_CARE_PROVIDER_SITE_OTHER): Payer: Medicare Other | Admitting: Neurology

## 2016-01-12 ENCOUNTER — Ambulatory Visit (INDEPENDENT_AMBULATORY_CARE_PROVIDER_SITE_OTHER): Payer: Self-pay | Admitting: Neurology

## 2016-01-12 DIAGNOSIS — M6281 Muscle weakness (generalized): Secondary | ICD-10-CM

## 2016-01-12 DIAGNOSIS — F05 Delirium due to known physiological condition: Secondary | ICD-10-CM

## 2016-01-12 DIAGNOSIS — Z0289 Encounter for other administrative examinations: Secondary | ICD-10-CM

## 2016-01-12 DIAGNOSIS — R531 Weakness: Secondary | ICD-10-CM

## 2016-01-12 DIAGNOSIS — R410 Disorientation, unspecified: Secondary | ICD-10-CM

## 2016-01-12 NOTE — Progress Notes (Signed)
  [facility]    Provider:  Dr Jaynee Eagles Referring Provider: Lucretia Kern, DO Primary Care Physician:  Lucretia Kern., DO  History:  [name] is a 82 y.o. female here for evaluation of weakness. She has a very complicated history including diabetes mellitus, hypertension, cardiovascular disease, cerebrovascular disease, peripheral vascular disease, obesity, degenerative joint disease, restless leg syndrome, trigeminal neuropathy, hyperlipidemia, congestive heart failure, hypertension, and others. Patient is here with progressive difficulties walking, evaluate for myopathy/myositis.  Summary  Nerve conduction studies were performed on the left upper and left lower extremities:  The left median APB motor nerve showed prolonged distal onset latency (4.9 ms, N<4.0). The left Median 2nd Digit sensory nerve showed prolonged distal peak latency (4.2 ms, N<3.9). F Wave studies indicate that the left Median F wave has normal latency.  Left Ulnar ADM motor nerve was within normal limits. F Wave studies indicate that the left Ulnar F wave has normal latency The left 5th digit sensory nerve was within normal limits.  The left Peroneal motor nerve showed decreased amplitude (0.36mv, N>2), F Wave latency showed no response  The left Tibial motor nerve showed decreased amplitude (0.46mv, N>2), F Wave latency showed no response  The left Sural sensory nerve conduction showed no response Left H Reflex showed no response Left Superficial Peroneal sensory nerve showed no response  EMG Needle study was performed on selected upper and lower extremity muscles:   The bilateral Deltoid, bilateral Triceps, bilateral Pronator Teres, bilateral Opponens Pollicis,bilateral First Dorsal Interosseous, bilateral Iliopsoas, bilateral Vastus Medialis and bilateral Lateralis, Anterior Tibialis(right only), Medial Gastrocnemius(right only)muscles were within normal limits.  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 There is also concomitant left Carpal Tunnel syndrome. No suggestion of myopathy or muscle disorder.    [name], MD  [facility] [address] [place], [place] [address]  Phone [phone] Fax [phone]

## 2016-01-13 NOTE — Progress Notes (Signed)
See procedure note.

## 2016-01-16 ENCOUNTER — Other Ambulatory Visit: Payer: Self-pay | Admitting: Cardiology

## 2016-01-17 NOTE — Procedures (Signed)
[facility]    Provider:  Dr Jaynee Eagles Referring Provider: Lucretia Kern, DO Primary Care Physician:  Lucretia Kern., DO  History:  [name] is a 82 y.o. female here for evaluation of weakness. She has a very complicated history including diabetes mellitus, hypertension, cardiovascular disease, cerebrovascular disease, peripheral vascular disease, obesity, degenerative joint disease, restless leg syndrome, trigeminal neuropathy, hyperlipidemia, congestive heart failure, hypertension, and others. Patient is here with progressive difficulties walking, evaluate for myopathy/myositis.  Summary  Nerve conduction studies were performed on the left upper and left lower extremities:  The left median APB motor nerve showed prolonged distal onset latency (4.9 ms, N<4.0). The left Median 2nd Digit sensory nerve showed prolonged distal peak latency (4.2 ms, N<3.9). F Wave studies indicate that the left Median F wave has normal latency.  Left Ulnar ADM motor nerve was within normal limits. F Wave studies indicate that the left Ulnar F wave has normal latency The left 5th digit sensory nerve was within normal limits.  The left Peroneal motor nerve showed decreased amplitude (0.36mv, N>2), F Wave latency showed no response  The left Tibial motor nerve showed decreased amplitude (0.2mv, N>2), F Wave latency showed no response  The left Sural sensory nerve conduction showed no response Left H Reflex showed no response Left Superficial Peroneal sensory nerve showed no response  EMG Needle study was performed on selected upper and lower extremity muscles:   The bilateral Deltoid, bilateral Triceps, bilateral Pronator Teres, bilateral Opponens Pollicis,bilateral First Dorsal Interosseous, bilateral Iliopsoas, bilateral Vastus Medialis and bilateral Lateralis, Anterior Tibialis(right only), Medial Gastrocnemius(right only)muscles were within normal limits.  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 There is also concomitant left Carpal Tunnel syndrome.  No suggestion of myopathy or muscle disorder.    [name], MD  [facility] [address] [place], [place] [address]  Phone [phone] Fax [phone]

## 2016-01-20 ENCOUNTER — Ambulatory Visit (INDEPENDENT_AMBULATORY_CARE_PROVIDER_SITE_OTHER): Payer: Medicare Other | Admitting: *Deleted

## 2016-01-20 DIAGNOSIS — R55 Syncope and collapse: Secondary | ICD-10-CM

## 2016-01-20 NOTE — Progress Notes (Signed)
[facility] Summary Report / Loop Recorder 

## 2016-01-21 ENCOUNTER — Other Ambulatory Visit: Payer: Self-pay | Admitting: Family Medicine

## 2016-01-21 ENCOUNTER — Other Ambulatory Visit: Payer: Self-pay | Admitting: Cardiology

## 2016-01-25 ENCOUNTER — Other Ambulatory Visit: Payer: Self-pay | Admitting: Family Medicine

## 2016-01-26 LAB — CUP PACEART REMOTE DEVICE CHECK: Date Time Interrogation Session: 20170622060543

## 2016-02-05 ENCOUNTER — Other Ambulatory Visit: Payer: Self-pay | Admitting: Cardiology

## 2016-02-14 ENCOUNTER — Telehealth: Payer: Self-pay | Admitting: Neurology

## 2016-02-14 NOTE — Telephone Encounter (Signed)
Patient's daughter is calling as her mother needs a referral for new wheel chair sent to [place].  Please call daughter

## 2016-02-15 NOTE — Telephone Encounter (Signed)
Rn call [name] pts daughter about needing a referral for a new wheelchair to be sent to [place]. Rn explain to pts daughter that Dr. Jaynee Eagles is on vacation this week and will return on Monday. Pt has been seeing Dr. Jaynee Eagles for a year and has had the wheelchair for 11 years. Rn stated there is nothing documented in last note about being evaluated for wheelchair. Rn stated usually home health agencies require a office visit within 30days of doing any new orders. Rn offer pt to schedule an appt with Dr.Ahern to be evaluated for a wheelchair. Pts daughter decline and will speak with her siblings about the next plan. Rn advise pts daughter to call back to schedule an appt to be evaluated for a wheelchair. Pts daughter verbalized understanding.

## 2016-02-21 ENCOUNTER — Ambulatory Visit (INDEPENDENT_AMBULATORY_CARE_PROVIDER_SITE_OTHER): Payer: Medicare Other | Admitting: *Deleted

## 2016-02-21 DIAGNOSIS — R55 Syncope and collapse: Secondary | ICD-10-CM

## 2016-02-21 NOTE — Progress Notes (Signed)
[facility] Summary Report / Loop Recorder 

## 2016-03-12 ENCOUNTER — Other Ambulatory Visit: Payer: Self-pay | Admitting: Family Medicine

## 2016-03-18 LAB — CUP PACEART REMOTE DEVICE CHECK: MDC IDC SESS DTM: 20170722060839

## 2016-03-18 NOTE — Progress Notes (Signed)
Carelink summary report received. Battery status OK. Normal device function. No new symptom episodes, tachy episodes, brady, or pause episodes. No new AF episodes. Monthly summary reports and ROV/PRN 

## 2016-03-20 ENCOUNTER — Ambulatory Visit (INDEPENDENT_AMBULATORY_CARE_PROVIDER_SITE_OTHER): Payer: Medicare Other | Admitting: *Deleted

## 2016-03-20 DIAGNOSIS — R55 Syncope and collapse: Secondary | ICD-10-CM

## 2016-03-20 NOTE — Progress Notes (Signed)
Carelink Summary Report / Loop Report 

## 2016-04-13 ENCOUNTER — Other Ambulatory Visit (HOSPITAL_COMMUNITY): Payer: Self-pay | Admitting: Podiatry

## 2016-04-13 DIAGNOSIS — B351 Tinea unguium: Secondary | ICD-10-CM | POA: Diagnosis not present

## 2016-04-13 DIAGNOSIS — E1151 Type 2 diabetes mellitus with diabetic peripheral angiopathy without gangrene: Secondary | ICD-10-CM | POA: Diagnosis not present

## 2016-04-13 DIAGNOSIS — L84 Corns and callosities: Secondary | ICD-10-CM | POA: Diagnosis not present

## 2016-04-13 DIAGNOSIS — L97521 Non-pressure chronic ulcer of other part of left foot limited to breakdown of skin: Secondary | ICD-10-CM

## 2016-04-17 ENCOUNTER — Other Ambulatory Visit (HOSPITAL_COMMUNITY): Admission: RE | Admit: 2016-04-17 | Payer: Self-pay | Source: Ambulatory Visit

## 2016-04-17 ENCOUNTER — Ambulatory Visit (HOSPITAL_COMMUNITY)
Admission: RE | Admit: 2016-04-17 | Discharge: 2016-04-17 | Disposition: A | Discharge status: Home / Self Care | Payer: Medicare Other | Source: Ambulatory Visit | Attending: Podiatry | Admitting: Podiatry

## 2016-04-17 DIAGNOSIS — E1151 Type 2 diabetes mellitus with diabetic peripheral angiopathy without gangrene: Secondary | ICD-10-CM | POA: Insufficient documentation

## 2016-04-17 DIAGNOSIS — L97529 Non-pressure chronic ulcer of other part of left foot with unspecified severity: Secondary | ICD-10-CM | POA: Diagnosis not present

## 2016-04-17 DIAGNOSIS — L97521 Non-pressure chronic ulcer of other part of left foot limited to breakdown of skin: Secondary | ICD-10-CM

## 2016-04-17 LAB — CUP PACEART REMOTE DEVICE CHECK: Date Time Interrogation Session: 20170821063605

## 2016-04-17 NOTE — Progress Notes (Signed)
Carelink summary report received. Battery status OK. Normal device function. No new symptom episodes, tachy episodes, brady, or pause episodes. No new AF episodes. Monthly summary reports and ROV/PRN 

## 2016-04-19 ENCOUNTER — Ambulatory Visit (INDEPENDENT_AMBULATORY_CARE_PROVIDER_SITE_OTHER): Payer: Medicare Other | Admitting: *Deleted

## 2016-04-19 DIAGNOSIS — R55 Syncope and collapse: Secondary | ICD-10-CM | POA: Diagnosis not present

## 2016-04-19 NOTE — Progress Notes (Signed)
[facility] Summary Report / Loop Recorder 

## 2016-05-04 ENCOUNTER — Telehealth: Payer: Self-pay | Admitting: Cardiology

## 2016-05-04 NOTE — Telephone Encounter (Signed)
Spoke w/ pt granddaughter and requested that pt send a manual transmission b/c her home monitor has not updated in at least 14 days.

## 2016-05-08 ENCOUNTER — Encounter: Payer: Self-pay | Admitting: Internal Medicine

## 2016-05-08 ENCOUNTER — Telehealth: Payer: Self-pay

## 2016-05-08 NOTE — Telephone Encounter (Signed)
Spoke with daughter over the phone asking her to send a manual transmission. Patient was unable to get to the home monitor- daughter will send a transmission when patient goes to bed this evening. Will review alert episodes in full at that time.

## 2016-05-10 NOTE — Telephone Encounter (Signed)
Manual LINQ transmission reviewed with Dr. Rayann Heman. Transmission shows 15 AF episodes (9.2% burden) with CHADSVASC of 7. Dr. Rayann Heman recommends stopping ASA/Plavix and starting Eliquis 2.5mg  BID if okay with Dr. Jaynee Eagles.

## 2016-05-10 NOTE — Telephone Encounter (Signed)
Dr. Rayann Heman, I completely agree with the Eliquis. Would you like [place] to prescribe it or have you already spoken to the patient about it? If not, I am happy to call patient and daughter and discuss with them and review the side effects, let me know. Thanks SO much!

## 2016-05-12 MED ORDER — APIXABAN 2.5 MG PO TABS
2.5000 mg | ORAL_TABLET | Freq: Two times a day (BID) | ORAL | 1 refills | Status: DC
Start: 2016-05-12 — End: 2016-06-28

## 2016-05-12 NOTE — Telephone Encounter (Signed)
Spoke with pt's daughter and explained that after reviewing her mothers transmission with Dr. Rayann Heman he felt there was a new diagnosis of Afib. I explained the afib process and the concern for an increased risk for stroke. I explained that we would be discontinuing her Asprin and Plavix and starting her on Eliquis. I confirmed pharmacy and answered additional questions. Pt is now scheduled for OV with JA on 10/23 at 12:30pm.

## 2016-05-13 ENCOUNTER — Other Ambulatory Visit: Payer: Self-pay | Admitting: Cardiology

## 2016-05-13 LAB — CUP PACEART REMOTE DEVICE CHECK: MDC IDC SESS DTM: 20170920063807

## 2016-05-13 NOTE — Progress Notes (Signed)
Carelink summary report received. Battery status OK. Normal device function. No new symptom episodes, tachy episodes, brady, or pause episodes. No new AF episodes. Monthly summary reports and ROV/PRN 

## 2016-05-17 ENCOUNTER — Emergency Department (HOSPITAL_COMMUNITY)
Admission: EM | Admit: 2016-05-17 | Discharge: 2016-05-17 | Disposition: A | Discharge status: Home / Self Care | Payer: Medicare Other | Attending: Emergency Medicine | Admitting: Emergency Medicine

## 2016-05-17 ENCOUNTER — Encounter (HOSPITAL_COMMUNITY): Payer: Self-pay | Admitting: Emergency Medicine

## 2016-05-17 DIAGNOSIS — G40909 Epilepsy, unspecified, not intractable, without status epilepticus: Secondary | ICD-10-CM | POA: Diagnosis not present

## 2016-05-17 DIAGNOSIS — I5032 Chronic diastolic (congestive) heart failure: Secondary | ICD-10-CM | POA: Insufficient documentation

## 2016-05-17 DIAGNOSIS — Z79899 Other long term (current) drug therapy: Secondary | ICD-10-CM | POA: Diagnosis not present

## 2016-05-17 DIAGNOSIS — R569 Unspecified convulsions: Secondary | ICD-10-CM | POA: Diagnosis not present

## 2016-05-17 DIAGNOSIS — E119 Type 2 diabetes mellitus without complications: Secondary | ICD-10-CM | POA: Insufficient documentation

## 2016-05-17 DIAGNOSIS — N390 Urinary tract infection, site not specified: Secondary | ICD-10-CM | POA: Insufficient documentation

## 2016-05-17 DIAGNOSIS — I110 Hypertensive heart disease with heart failure: Secondary | ICD-10-CM | POA: Insufficient documentation

## 2016-05-17 DIAGNOSIS — Z7984 Long term (current) use of oral hypoglycemic drugs: Secondary | ICD-10-CM | POA: Insufficient documentation

## 2016-05-17 DIAGNOSIS — R112 Nausea with vomiting, unspecified: Secondary | ICD-10-CM | POA: Diagnosis not present

## 2016-05-17 DIAGNOSIS — R404 Transient alteration of awareness: Secondary | ICD-10-CM | POA: Diagnosis not present

## 2016-05-17 DIAGNOSIS — I2510 Atherosclerotic heart disease of native coronary artery without angina pectoris: Secondary | ICD-10-CM | POA: Insufficient documentation

## 2016-05-17 DIAGNOSIS — R531 Weakness: Secondary | ICD-10-CM | POA: Diagnosis not present

## 2016-05-17 LAB — COMPREHENSIVE METABOLIC PANEL
ALT: 13 U/L — AB (ref 14–54)
AST: 19 U/L (ref 15–41)
Albumin: 3 g/dL — ABNORMAL LOW (ref 3.5–5.0)
Alkaline Phosphatase: 41 U/L (ref 38–126)
Anion gap: 11 (ref 5–15)
BUN: 22 mg/dL — AB (ref 6–20)
CHLORIDE: 101 mmol/L (ref 101–111)
CO2: 29 mmol/L (ref 22–32)
CREATININE: 0.82 mg/dL (ref 0.44–1.00)
Calcium: 8.8 mg/dL — ABNORMAL LOW (ref 8.9–10.3)
GFR calc non Af Amer: >60 mL/min (ref >60)
Glucose, Bld: 99 mg/dL (ref 65–99)
Potassium: 4.5 mmol/L (ref 3.5–5.1)
SODIUM: 141 mmol/L (ref 135–145)
Total Bilirubin: 0.4 mg/dL (ref 0.3–1.2)
Total Protein: 6.2 g/dL — ABNORMAL LOW (ref 6.5–8.1)

## 2016-05-17 LAB — CBC WITH DIFFERENTIAL/PLATELET
BASOS ABS: 0 10*3/uL (ref 0.0–0.1)
Basophils Relative: 0 %
Eosinophils Absolute: 0 10*3/uL (ref 0.0–0.7)
Eosinophils Relative: 0 %
HEMATOCRIT: 37.5 % (ref 36.0–46.0)
HEMOGLOBIN: 12 g/dL (ref 12.0–15.0)
LYMPHS PCT: 16 %
Lymphs Abs: 1.2 10*3/uL (ref 0.7–4.0)
MCH: 29.2 pg (ref 26.0–34.0)
MCHC: 32 g/dL (ref 30.0–36.0)
MCV: 91.2 fL (ref 78.0–100.0)
MONO ABS: 0.6 10*3/uL (ref 0.1–1.0)
Monocytes Relative: 8 %
NEUTROS ABS: 5.5 10*3/uL (ref 1.7–7.7)
NEUTROS PCT: 76 %
Platelets: 161 10*3/uL (ref 150–400)
RBC: 4.11 MIL/uL (ref 3.87–5.11)
RDW: 14.5 % (ref 11.5–15.5)
WBC: 7.3 10*3/uL (ref 4.0–10.5)

## 2016-05-17 LAB — URINALYSIS, ROUTINE W REFLEX MICROSCOPIC
Bilirubin Urine: NEGATIVE
GLUCOSE, UA: NEGATIVE mg/dL
Hgb urine dipstick: NEGATIVE
KETONES UR: 15 mg/dL — AB
Nitrite: NEGATIVE
PH: 6 (ref 5.0–8.0)
SPECIFIC GRAVITY, URINE: 1.02 (ref 1.005–1.030)

## 2016-05-17 LAB — URINE MICROSCOPIC-ADD ON: RBC / HPF: NONE SEEN RBC/hpf (ref 0–5)

## 2016-05-17 LAB — CARBAMAZEPINE LEVEL, TOTAL

## 2016-05-17 LAB — LIPASE, BLOOD: Lipase: 18 U/L (ref 11–51)

## 2016-05-17 LAB — TROPONIN I

## 2016-05-17 LAB — VALPROIC ACID LEVEL: Valproic Acid Lvl: 63 ug/mL (ref 50.0–100.0)

## 2016-05-17 MED ORDER — CEPHALEXIN 250 MG PO CAPS
250.0000 mg | ORAL_CAPSULE | Freq: Once | ORAL | Status: AC
  Administered 2016-05-17: 250 mg via ORAL
  Filled 2016-05-17: qty 1

## 2016-05-17 MED ORDER — CEPHALEXIN 250 MG PO CAPS: 250.0000 mg | ORAL_CAPSULE | Freq: Four times a day (QID) | ORAL | 0 refills | Status: DC

## 2016-05-17 MED ORDER — CEPHALEXIN 250 MG PO CAPS
ORAL_CAPSULE | ORAL | Status: AC
  Filled 2016-05-17: qty 1

## 2016-05-17 NOTE — ED Triage Notes (Signed)
Patient arrives via EMS from home. Granddaughter called EMS for seizure. Patient arrives alert/oriented x 4, does not appear post-ictal. Patient states she was eating and suddenly her legs felt weak and that she was unable to stand. Per EMS, granddaughter reports patient was trying to stand and had some twitching of her head. Patient endorsed nausea with EMS and vomited shortly after their arrival on scene. EMS gave patient 4 mg zofran. Patient denies any pain, any nausea at present.
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
## 2016-05-17 NOTE — ED Notes (Signed)
[employer] Loop recorder interrogated per MD orders.

## 2016-05-19 ENCOUNTER — Ambulatory Visit (INDEPENDENT_AMBULATORY_CARE_PROVIDER_SITE_OTHER): Payer: Medicare Other | Admitting: *Deleted

## 2016-05-19 DIAGNOSIS — R55 Syncope and collapse: Secondary | ICD-10-CM | POA: Diagnosis not present

## 2016-05-19 NOTE — Progress Notes (Signed)
[facility] Summary Report / Loop Recorder 

## 2016-05-20 LAB — URINE CULTURE: Culture: >=100000 — AB

## 2016-05-21 ENCOUNTER — Telehealth (HOSPITAL_BASED_OUTPATIENT_CLINIC_OR_DEPARTMENT_OTHER): Payer: Self-pay

## 2016-05-21 NOTE — Telephone Encounter (Signed)
Post ED Visit - Positive Culture Follow-up  Culture report reviewed by antimicrobial stewardship pharmacist:  []  Elenor Quinones, Pharm.D. []  Heide Guile, Pharm.D., BCPS []  Parks Neptune, Pharm.D. []  Alycia Rossetti, Pharm.D., BCPS [x]  [place]ier, Pharm.D., BCPS, AAHIVP []  Legrand Como, Pharm.D., BCPS, AAHIVP []  Milus Glazier, Pharm.D. []  Rob Evette Doffing, Pharm.D.  Positive urine culture Treated with Cephalexin, organism sensitive to the same and no further patient follow-up is required at this time.  Genia Del 05/21/2016, 11:04 AM

## 2016-05-22 ENCOUNTER — Encounter: Payer: Medicare Other | Admitting: Internal Medicine

## 2016-05-22 ENCOUNTER — Ambulatory Visit (INDEPENDENT_AMBULATORY_CARE_PROVIDER_SITE_OTHER): Payer: Medicare Other | Admitting: Family Medicine

## 2016-05-22 ENCOUNTER — Telehealth: Payer: Self-pay

## 2016-05-22 VITALS — BP 100/60 | HR 68 | Temp 97.8°F

## 2016-05-22 DIAGNOSIS — Z23 Encounter for immunization: Secondary | ICD-10-CM | POA: Diagnosis not present

## 2016-05-22 DIAGNOSIS — I2510 Atherosclerotic heart disease of native coronary artery without angina pectoris: Secondary | ICD-10-CM | POA: Diagnosis not present

## 2016-05-22 DIAGNOSIS — R569 Unspecified convulsions: Secondary | ICD-10-CM

## 2016-05-22 DIAGNOSIS — E1151 Type 2 diabetes mellitus with diabetic peripheral angiopathy without gangrene: Secondary | ICD-10-CM

## 2016-05-22 DIAGNOSIS — E785 Hyperlipidemia, unspecified: Secondary | ICD-10-CM

## 2016-05-22 DIAGNOSIS — I10 Essential (primary) hypertension: Secondary | ICD-10-CM | POA: Diagnosis not present

## 2016-05-22 DIAGNOSIS — Z89511 Acquired absence of right leg below knee: Secondary | ICD-10-CM

## 2016-05-22 NOTE — Telephone Encounter (Signed)
Prior auth for Eliquis 2.5mg  obtained from [facility]. [id number], good through 07/30/2017.
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
## 2016-05-24 ENCOUNTER — Encounter: Payer: Self-pay | Admitting: Internal Medicine

## 2016-05-24 ENCOUNTER — Ambulatory Visit (INDEPENDENT_AMBULATORY_CARE_PROVIDER_SITE_OTHER): Payer: Medicare Other | Admitting: Internal Medicine

## 2016-05-24 VITALS — BP 100/58 | HR 103 | Ht 61.0 in

## 2016-05-24 DIAGNOSIS — R55 Syncope and collapse: Secondary | ICD-10-CM | POA: Diagnosis not present

## 2016-05-24 DIAGNOSIS — I4819 Other persistent atrial fibrillation: Secondary | ICD-10-CM

## 2016-05-24 DIAGNOSIS — I10 Essential (primary) hypertension: Secondary | ICD-10-CM

## 2016-05-24 DIAGNOSIS — I060 Rheumatic aortic stenosis: Secondary | ICD-10-CM | POA: Diagnosis not present

## 2016-05-24 DIAGNOSIS — I481 Persistent atrial fibrillation: Secondary | ICD-10-CM

## 2016-05-24 DIAGNOSIS — I2510 Atherosclerotic heart disease of native coronary artery without angina pectoris: Secondary | ICD-10-CM | POA: Diagnosis not present

## 2016-05-24 MED ORDER — HYDROCHLOROTHIAZIDE 25 MG PO TABS
25.0000 mg | ORAL_TABLET | ORAL | 3 refills | Status: AC | PRN
Start: 2016-05-24 — End: 2016-08-22
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
## 2016-05-24 NOTE — Patient Instructions (Signed)
Medication Instructions:  Your physician has recommended you make the following change in your medication:  1) Only take the HCTZ as needed   Labwork: None ordered   Testing/Procedures: None ordered   Follow-Up: Your physician recommends that you schedule a follow-up appointment as scheduled   Any Other Special Instructions Will Be Listed Below (If Applicable).     If you need a refill on your cardiac medications before your next appointment, please call your pharmacy.

## 2016-05-25 ENCOUNTER — Telehealth: Payer: Self-pay | Admitting: Family Medicine

## 2016-05-25 NOTE — Telephone Encounter (Signed)
I called the pts daughter and she stated she is concerned about the pts blood pressure being low as it was 100/58 yesterday at Dr Jackalyn Lombard office and he discontinued the HCTZ.  Ms Kirk Ruths stated her BP was 100/50 this morning and she wanted to know if the pt can stop taking the Losartan and Amlodipine or decrease the dose on these?  Message sent to Dr Maudie Mercury.

## 2016-05-25 NOTE — Telephone Encounter (Signed)
I called the pts daughter Ms Kirk Ruths and informed her of the message below and she agreed.  She asked if she could have her family member check this at home and call back as it is hard to get her in and out of the car.  Message sent to Dr Maudie Mercury.

## 2016-05-25 NOTE — Telephone Encounter (Signed)
Could decrease the norvasc to 1/2 tablet (5 mg) and monitor at home or follow up here in a few weeks to recheck.

## 2016-05-25 NOTE — Telephone Encounter (Signed)
[name] pts daughter would like to speak with you concerning her mothers medication

## 2016-06-01 ENCOUNTER — Other Ambulatory Visit: Payer: Self-pay | Admitting: Family Medicine

## 2016-06-05 ENCOUNTER — Other Ambulatory Visit: Payer: Self-pay | Admitting: Cardiology

## 2016-06-12 ENCOUNTER — Encounter: Payer: Self-pay | Admitting: Physician Assistant

## 2016-06-14 ENCOUNTER — Other Ambulatory Visit: Payer: Self-pay | Admitting: *Deleted

## 2016-06-14 MED ORDER — DIVALPROEX SODIUM ER 500 MG PO TB24: 500.0000 mg | ORAL_TABLET | Freq: Two times a day (BID) | ORAL | 1 refills | Status: DC

## 2016-06-16 DIAGNOSIS — M1990 Unspecified osteoarthritis, unspecified site: Secondary | ICD-10-CM | POA: Diagnosis not present

## 2016-06-16 DIAGNOSIS — Z13820 Encounter for screening for osteoporosis: Secondary | ICD-10-CM | POA: Diagnosis not present

## 2016-06-16 DIAGNOSIS — Z7952 Long term (current) use of systemic steroids: Secondary | ICD-10-CM | POA: Diagnosis not present

## 2016-06-17 LAB — CUP PACEART REMOTE DEVICE CHECK
Implantable Pulse Generator Implant Date: 20150326
MDC IDC SESS DTM: 20171020063734

## 2016-06-17 NOTE — Progress Notes (Signed)
Carelink summary report received. Battery status OK. Normal device function. No new symptom episodes, tachy episodes, brady, or pause episodes. 7% AF, V rates controlled, +Eliquis. Monthly summary reports and ROV/PRN

## 2016-06-19 ENCOUNTER — Ambulatory Visit (INDEPENDENT_AMBULATORY_CARE_PROVIDER_SITE_OTHER): Payer: Medicare Other | Admitting: *Deleted

## 2016-06-19 DIAGNOSIS — R55 Syncope and collapse: Secondary | ICD-10-CM | POA: Diagnosis not present

## 2016-06-19 NOTE — Progress Notes (Signed)
[facility] Summary Report / Loop Recorder 

## 2016-06-26 ENCOUNTER — Encounter: Payer: Medicare Other | Admitting: Physician Assistant

## 2016-06-26 ENCOUNTER — Telehealth: Payer: Self-pay | Admitting: Family Medicine

## 2016-06-26 NOTE — Telephone Encounter (Signed)
Spoke with [name], pt stated that she has already received her awv for this year and would like to cancel appt that was scheduled on 08/28/2016.

## 2016-06-27 ENCOUNTER — Encounter: Payer: Self-pay | Admitting: Physician Assistant

## 2016-06-28 ENCOUNTER — Ambulatory Visit (INDEPENDENT_AMBULATORY_CARE_PROVIDER_SITE_OTHER): Payer: Medicare Other | Admitting: Physician Assistant

## 2016-06-28 VITALS — BP 130/64 | HR 65 | Ht 61.0 in

## 2016-06-28 DIAGNOSIS — I10 Essential (primary) hypertension: Secondary | ICD-10-CM

## 2016-06-28 DIAGNOSIS — I480 Paroxysmal atrial fibrillation: Secondary | ICD-10-CM | POA: Diagnosis not present

## 2016-06-28 DIAGNOSIS — I2510 Atherosclerotic heart disease of native coronary artery without angina pectoris: Secondary | ICD-10-CM

## 2016-06-28 MED ORDER — APIXABAN 5 MG PO TABS: 5.0000 mg | ORAL_TABLET | Freq: Two times a day (BID) | ORAL | 6 refills | Status: DC
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
## 2016-06-28 NOTE — Patient Instructions (Signed)
Medication Instructions:   START TAKING  ELIQUIS 5 MG TWICE A DAY   If you need a refill on your cardiac medications before your next appointment, please call your pharmacy.  Labwork: NONE ORDERED  TODAY    Testing/Procedures: NONE ORDERED  TODAY    Follow-Up:  Your physician wants you to follow-up in:  IN [place] will receive a reminder letter in the mail two months in advance. If you don't receive a letter, please call our office to schedule the follow-up appointment.       Any Other Special Instructions Will Be Listed Below (If Applicable).

## 2016-07-04 ENCOUNTER — Telehealth: Payer: Self-pay | Admitting: Internal Medicine

## 2016-07-04 NOTE — Telephone Encounter (Signed)
Spoke with patient's daughter and let her know that as long as the bleeding doesn't persist then this is nothing to be alarmed about.  If she notices any blood in urine or BM's to call us and let us know.  She verbalized understanding and agrees with plan

## 2016-07-04 NOTE — Telephone Encounter (Signed)
Pt's dtr calling regarding starting Eliquis and was told to call if bleeding occurs-pt's gums started bleeding on Saturday-pls advise  [phone]

## 2016-07-17 ENCOUNTER — Other Ambulatory Visit: Payer: Self-pay | Admitting: Internal Medicine

## 2016-07-18 ENCOUNTER — Ambulatory Visit (INDEPENDENT_AMBULATORY_CARE_PROVIDER_SITE_OTHER): Payer: Medicare Other | Admitting: *Deleted

## 2016-07-18 DIAGNOSIS — R55 Syncope and collapse: Secondary | ICD-10-CM

## 2016-07-18 NOTE — Progress Notes (Signed)
[facility] Summary Report / Loop Recorder 

## 2016-07-18 NOTE — Telephone Encounter (Signed)
Supervision Information   Supervising Provider Type of Supervision  Thompson Grayer, MD Supervision Required  Medication Detail    Disp Refills Start End   apixaban (ELIQUIS) 5 MG TABS tablet 60 tablet 6 06/28/2016    Sig - Route: Take 1 tablet (5 mg total) by mouth 2 (two) times daily. - Oral   E-Prescribing Status: Receipt confirmed by pharmacy (06/28/2016 11:52 AM EST)   Pharmacy   [id number] - [place], [place]

## 2016-07-19 ENCOUNTER — Telehealth: Payer: Self-pay | Admitting: Internal Medicine

## 2016-07-19 NOTE — Telephone Encounter (Signed)
Ms. Kirk Ruths (daugther) is calling because her mother is having some bleeding in her mouth. She is currently on Eliquis and they are wanting to know what they should do about that? Please call ([phone])  before1 pm and please call [phone]) Deirdre Seivert # after 1 pm. Thanks.

## 2016-07-21 ENCOUNTER — Emergency Department (HOSPITAL_COMMUNITY)
Admission: EM | Admit: 2016-07-21 | Discharge: 2016-07-21 | Disposition: A | Discharge status: Home / Self Care | Payer: Medicare Other | Attending: Emergency Medicine | Admitting: Emergency Medicine

## 2016-07-21 ENCOUNTER — Telehealth: Payer: Self-pay | Admitting: Internal Medicine

## 2016-07-21 ENCOUNTER — Encounter (HOSPITAL_COMMUNITY): Payer: Self-pay

## 2016-07-21 ENCOUNTER — Emergency Department (HOSPITAL_COMMUNITY): Payer: Medicare Other

## 2016-07-21 DIAGNOSIS — Z79899 Other long term (current) drug therapy: Secondary | ICD-10-CM | POA: Insufficient documentation

## 2016-07-21 DIAGNOSIS — I2510 Atherosclerotic heart disease of native coronary artery without angina pectoris: Secondary | ICD-10-CM | POA: Diagnosis not present

## 2016-07-21 DIAGNOSIS — Z7901 Long term (current) use of anticoagulants: Secondary | ICD-10-CM | POA: Insufficient documentation

## 2016-07-21 DIAGNOSIS — K1379 Other lesions of oral mucosa: Secondary | ICD-10-CM | POA: Diagnosis not present

## 2016-07-21 DIAGNOSIS — Z7984 Long term (current) use of oral hypoglycemic drugs: Secondary | ICD-10-CM | POA: Insufficient documentation

## 2016-07-21 DIAGNOSIS — Z8673 Personal history of transient ischemic attack (TIA), and cerebral infarction without residual deficits: Secondary | ICD-10-CM | POA: Insufficient documentation

## 2016-07-21 DIAGNOSIS — R042 Hemoptysis: Secondary | ICD-10-CM | POA: Insufficient documentation

## 2016-07-21 DIAGNOSIS — E119 Type 2 diabetes mellitus without complications: Secondary | ICD-10-CM | POA: Insufficient documentation

## 2016-07-21 DIAGNOSIS — Z96652 Presence of left artificial knee joint: Secondary | ICD-10-CM | POA: Diagnosis not present

## 2016-07-21 DIAGNOSIS — I5032 Chronic diastolic (congestive) heart failure: Secondary | ICD-10-CM | POA: Diagnosis not present

## 2016-07-21 DIAGNOSIS — I110 Hypertensive heart disease with heart failure: Secondary | ICD-10-CM | POA: Diagnosis not present

## 2016-07-21 LAB — COMPREHENSIVE METABOLIC PANEL
ALBUMIN: 2.8 g/dL — AB (ref 3.5–5.0)
ALT: 14 U/L (ref 14–54)
ANION GAP: 8 (ref 5–15)
AST: 23 U/L (ref 15–41)
Alkaline Phosphatase: 40 U/L (ref 38–126)
BUN: 11 mg/dL (ref 6–20)
CALCIUM: 9.1 mg/dL (ref 8.9–10.3)
CHLORIDE: 108 mmol/L (ref 101–111)
CO2: 29 mmol/L (ref 22–32)
Creatinine, Ser: 0.78 mg/dL (ref 0.44–1.00)
GFR calc non Af Amer: >60 mL/min (ref >60)
GLUCOSE: 78 mg/dL (ref 65–99)
POTASSIUM: 4.1 mmol/L (ref 3.5–5.1)
SODIUM: 145 mmol/L (ref 135–145)
Total Bilirubin: 0.3 mg/dL (ref 0.3–1.2)
Total Protein: 6 g/dL — ABNORMAL LOW (ref 6.5–8.1)

## 2016-07-21 LAB — ABO/RH: ABO/RH(D): O POS

## 2016-07-21 LAB — TYPE AND SCREEN
ABO/RH(D): O POS
Antibody Screen: NEGATIVE

## 2016-07-21 LAB — CBC
HEMATOCRIT: 39.2 % (ref 36.0–46.0)
HEMOGLOBIN: 12.6 g/dL (ref 12.0–15.0)
MCH: 29.6 pg (ref 26.0–34.0)
MCHC: 32.1 g/dL (ref 30.0–36.0)
MCV: 92 fL (ref 78.0–100.0)
Platelets: 144 10*3/uL — ABNORMAL LOW (ref 150–400)
RBC: 4.26 MIL/uL (ref 3.87–5.11)
RDW: 14.7 % (ref 11.5–15.5)
WBC: 5.4 10*3/uL (ref 4.0–10.5)

## 2016-07-21 NOTE — Telephone Encounter (Signed)
New Message  Pt c/o medication issue:  1. Name of Medication: Eliquis   2. How are you currently taking this medication (dosage and times per day)? Yes...10mg  a day/5mg  in morning/5mg  evening  3. Are you having a reaction (difficulty breathing--STAT)? Patient having blood in mouth  4. What is your medication issue? Wants to know if patient needs to continue taking medication  Patient taken to hospital everything checked out okay.

## 2016-07-21 NOTE — ED Triage Notes (Signed)
Coughing and spitting up blood for around 2 weeks. None witnessed but blood on teeth and around mouth. Altered mental at baseline. Vitals WDL with EMS.

## 2016-07-21 NOTE — Telephone Encounter (Signed)
Spoke with the daughter and she says some nights she has dried blood in her mouth(says a lot).  It does not occur during the day.  This is the 5th or 6th time she has woken up with dried blood all in her mouth.  They are scared she is going to choke on the blood during the night.  She has called EMS to take her Mom to [facility] hospital to be evaluated.

## 2016-07-21 NOTE — Telephone Encounter (Signed)
Returned call to daughter and let her know that I could not give them permission to decrease the dose of Eliquis.  This would have to come from Dr Rayann Heman.  Let her know I would discuss with him next week and call her back
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
## 2016-07-25 ENCOUNTER — Telehealth: Payer: Self-pay | Admitting: Family Medicine

## 2016-07-25 NOTE — Telephone Encounter (Signed)
Pts daughter would like to talk with you about the pts health was bleeding from the mouth went to the ER and family just wanted you to know.

## 2016-07-27 NOTE — Telephone Encounter (Signed)
Ease help set up follow up appt to evaluate - unless fyi. Thanks.

## 2016-07-27 NOTE — Telephone Encounter (Signed)
I left a message for the pt to return my call. 

## 2016-07-29 ENCOUNTER — Other Ambulatory Visit: Payer: Self-pay | Admitting: Family Medicine

## 2016-07-29 LAB — CUP PACEART REMOTE DEVICE CHECK
Date Time Interrogation Session: 20171119070607
MDC IDC PG IMPLANT DT: 20150326

## 2016-07-29 NOTE — Progress Notes (Signed)
Carelink summary report received. Battery status OK. Normal device function. No new symptom episodes, brady, or pause episodes. 2 tachy- ECGs appear AF w/ RVR. 107 AF 22.3% +Eliquis. Monthly summary reports and ROV/PRN

## 2016-08-03 ENCOUNTER — Encounter (HOSPITAL_COMMUNITY): Payer: Self-pay | Admitting: Emergency Medicine

## 2016-08-03 ENCOUNTER — Emergency Department (HOSPITAL_COMMUNITY)
Admission: EM | Admit: 2016-08-03 | Discharge: 2016-08-03 | Disposition: A | Discharge status: Home / Self Care | Payer: Medicare Other | Attending: Emergency Medicine | Admitting: Emergency Medicine

## 2016-08-03 DIAGNOSIS — G40909 Epilepsy, unspecified, not intractable, without status epilepticus: Secondary | ICD-10-CM | POA: Insufficient documentation

## 2016-08-03 DIAGNOSIS — I5032 Chronic diastolic (congestive) heart failure: Secondary | ICD-10-CM | POA: Insufficient documentation

## 2016-08-03 DIAGNOSIS — E119 Type 2 diabetes mellitus without complications: Secondary | ICD-10-CM | POA: Diagnosis not present

## 2016-08-03 DIAGNOSIS — R569 Unspecified convulsions: Secondary | ICD-10-CM

## 2016-08-03 DIAGNOSIS — I110 Hypertensive heart disease with heart failure: Secondary | ICD-10-CM | POA: Diagnosis not present

## 2016-08-03 DIAGNOSIS — Z79899 Other long term (current) drug therapy: Secondary | ICD-10-CM | POA: Diagnosis not present

## 2016-08-03 DIAGNOSIS — R404 Transient alteration of awareness: Secondary | ICD-10-CM | POA: Diagnosis not present

## 2016-08-03 DIAGNOSIS — I2510 Atherosclerotic heart disease of native coronary artery without angina pectoris: Secondary | ICD-10-CM | POA: Diagnosis not present

## 2016-08-03 DIAGNOSIS — R531 Weakness: Secondary | ICD-10-CM | POA: Diagnosis not present

## 2016-08-03 DIAGNOSIS — Z7984 Long term (current) use of oral hypoglycemic drugs: Secondary | ICD-10-CM | POA: Insufficient documentation

## 2016-08-03 DIAGNOSIS — R061 Stridor: Secondary | ICD-10-CM | POA: Diagnosis not present

## 2016-08-03 LAB — CBC
HCT: 40.7 % (ref 36.0–46.0)
Hemoglobin: 12.9 g/dL (ref 12.0–15.0)
MCH: 29.9 pg (ref 26.0–34.0)
MCHC: 31.7 g/dL (ref 30.0–36.0)
MCV: 94.2 fL (ref 78.0–100.0)
PLATELETS: 154 10*3/uL (ref 150–400)
RBC: 4.32 MIL/uL (ref 3.87–5.11)
RDW: 14.5 % (ref 11.5–15.5)
WBC: 5.8 10*3/uL (ref 4.0–10.5)

## 2016-08-03 LAB — BASIC METABOLIC PANEL
Anion gap: 10 (ref 5–15)
BUN: 15 mg/dL (ref 6–20)
CALCIUM: 9.1 mg/dL (ref 8.9–10.3)
CO2: 29 mmol/L (ref 22–32)
CREATININE: 0.83 mg/dL (ref 0.44–1.00)
Chloride: 102 mmol/L (ref 101–111)
GFR calc Af Amer: >60 mL/min (ref >60)
Glucose, Bld: 110 mg/dL — ABNORMAL HIGH (ref 65–99)
Potassium: 3.6 mmol/L (ref 3.5–5.1)
Sodium: 141 mmol/L (ref 135–145)

## 2016-08-03 LAB — URINALYSIS, ROUTINE W REFLEX MICROSCOPIC
BILIRUBIN URINE: NEGATIVE
Bacteria, UA: NONE SEEN
GLUCOSE, UA: NEGATIVE mg/dL
Hgb urine dipstick: NEGATIVE
KETONES UR: 5 mg/dL — AB
LEUKOCYTES UA: NEGATIVE
NITRITE: NEGATIVE
PH: 5 (ref 5.0–8.0)
Protein, ur: NEGATIVE mg/dL
SPECIFIC GRAVITY, URINE: 1.019 (ref 1.005–1.030)

## 2016-08-03 LAB — VALPROIC ACID LEVEL: Valproic Acid Lvl: 60 ug/mL (ref 50.0–100.0)

## 2016-08-03 LAB — CBG MONITORING, ED: Glucose-Capillary: 95 mg/dL (ref 65–99)
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
## 2016-08-03 NOTE — ED Triage Notes (Signed)
Per EMS-Pt daughter reports weakness x 2 days with syncopal episode while sitting in recliner today. Pt denies pain, c/o feeling weak. Pt alert and oriented to person and place. Nad noted.

## 2016-08-03 NOTE — Discharge Instructions (Signed)
Continue giving her her regular medications.  Encourage her to eat and drink regularly.  Follow-up with her primary care doctor and neurologist as soon as possible.  We have asked advanced home care services to visit her and check on her needs for physical therapy, as well as placement of a facility.

## 2016-08-04 MED ORDER — APIXABAN 2.5 MG PO TABS: 5.0000 mg | ORAL_TABLET | Freq: Two times a day (BID) | ORAL | Status: AC

## 2016-08-04 NOTE — Telephone Encounter (Signed)
Discussed with Dr Rayann Heman, and he is okay with her decreasing her Eliquis to 2.5 mg twice daily  She went back to the ER yesterday for a seizure

## 2016-08-10 ENCOUNTER — Telehealth: Payer: Self-pay | Admitting: Family Medicine

## 2016-08-10 NOTE — Telephone Encounter (Signed)
I called the pts daughter and she wanted to let Dr Maudie Mercury know the pt was seen by Dr Dossie Der in November and diagnosed with early dementia and Parkinson's.  She also stated the pt is now bedridden, sleeps a lot and it is hard to transport her as after the ER visit last week for a seizure they suggested she follow up with her PCP and the neurologist but the family has hard time transporting her. Ms [name] wanted to thank Dr Maudie Mercury for all she has done and let her know the patient still has a good sense of humor, smiles and often tells her thank you for helping her.  Message sent to Dr Maudie Mercury.

## 2016-08-10 NOTE — Telephone Encounter (Signed)
Pts dtr would like to speak with concerning the pt.

## 2016-08-17 ENCOUNTER — Ambulatory Visit (INDEPENDENT_AMBULATORY_CARE_PROVIDER_SITE_OTHER): Payer: Medicare Other | Admitting: *Deleted

## 2016-08-17 DIAGNOSIS — R55 Syncope and collapse: Secondary | ICD-10-CM | POA: Diagnosis not present

## 2016-08-17 NOTE — Telephone Encounter (Signed)
Prior note had typo. Should have been *called.

## 2016-08-17 NOTE — Telephone Encounter (Signed)
Call to check on them and see how we can help. LM.

## 2016-08-18 ENCOUNTER — Telehealth: Payer: Self-pay | Admitting: Family Medicine

## 2016-08-18 NOTE — Progress Notes (Signed)
[facility] Summary Report / Loop Recorder 

## 2016-08-18 NOTE — Telephone Encounter (Signed)
Spoke with caller. Pt doing well today. Family planning to see neurologist and they are hoping to keep pt at home as much as possible during the winter particularly as she has difficult ambulating. She reports they prefer not to send her to a higher level of care and having family members with her 24/7 to assist her. They do not wish to do hospice or palliative care at this point but are considering. They agree to call if any concerns.

## 2016-08-18 NOTE — Telephone Encounter (Signed)
Pt's daughter, Cornelia, is calling back to discuss some concerns she is having about her mother regarding her parkinsons.  Her best number is [phone].

## 2016-08-22 ENCOUNTER — Ambulatory Visit: Payer: Medicare Other

## 2016-08-23 ENCOUNTER — Telehealth: Payer: Self-pay | Admitting: *Deleted

## 2016-08-23 DIAGNOSIS — R410 Disorientation, unspecified: Secondary | ICD-10-CM

## 2016-08-23 DIAGNOSIS — M6281 Muscle weakness (generalized): Secondary | ICD-10-CM

## 2016-08-23 DIAGNOSIS — R569 Unspecified convulsions: Secondary | ICD-10-CM

## 2016-08-23 DIAGNOSIS — F05 Delirium due to known physiological condition: Principal | ICD-10-CM

## 2016-08-23 NOTE — Telephone Encounter (Signed)
Patient's daughter [name] is wanting to come in and talk to Dr. Jaynee Eagles about her mother please call [phone]

## 2016-08-23 NOTE — Telephone Encounter (Addendum)
Tried returning call. VM not set up, unable to LVM.  Daughter will have to come with patient to appt. PLaced appt on 09/06/16 at 10am on hold if this works. Dr Jaynee Eagles is booked out. They can come in sooner to see NP if they want.

## 2016-08-24 ENCOUNTER — Telehealth: Payer: Self-pay | Admitting: Neurology

## 2016-08-24 NOTE — Telephone Encounter (Signed)
[name], I would recommend a hospice consult. Hospice physician can come into the home and evaluate them. Hospice can help them in the home and with medical management and insurance pays for it and also has counselors to help them. Please see if this is something they would be interested in.

## 2016-08-24 NOTE — Telephone Encounter (Signed)
Dr Ahern- FYI 

## 2016-08-24 NOTE — Telephone Encounter (Signed)
Pt's daughter called back. She said the pt is not ambulatory at this point and could not come to an appt. She and her sister want to talk with Dr A about their concerns and thought an appt could made to do this.  Please call [phone]

## 2016-08-24 NOTE — Telephone Encounter (Signed)
[facility] sent a different phone note this am RE message I left yesterday:  "Pt's daughter called back. She said the pt is not ambulatory at this point and could not come to an appt. She and her sister want to talk with Dr A about their concerns and thought an appt could made to do this.  Please call [phone]"

## 2016-08-24 NOTE — Telephone Encounter (Signed)
See other phone note

## 2016-08-24 NOTE — Telephone Encounter (Signed)
Called daughter back. Advised patient would have to come to appt with them by law. She stated her mother is not able to walk, her arthritis doctor told her she has early signs dementia and parkinson's and recommended they contact Dr Jaynee Eagles. She last saw arthritis doctor back in October or November. She cannot remember the exact date.    She states they have not taken her out of the house anymore.  She was going to church on Sunday but now she is bedridden.  They do not get her up to go to the bathroom. They use pads underneath her and depends and wash her up in bed.  Sister and brothers are taking care of her. Takes turns at night to take care of her. Niece takes care of her during the day. She is going to get certified as CNA soon.   I asked daughter more about if they were able to get a wheelchair. She states that whenever they get her up, they put her in a wheelchair. If they get her in a wheelchair it is hard to get her in and out of the car. This is too difficult for them. They are unable to do this and transport themselves at this point. I did recommend that they can call EMS to have them transport her to and from appt. She verbalized understanding but stated this was too expensive. I advised that Dr Jaynee Eagles would have to see her and evaluate her in the office to be able to make any recommendations for management.   She wanted to know what their options are at this point. She was talking with Dr Maudie Mercury (PCP) and he told them to contact Dr Jaynee Eagles.  They have looked into putting her into facility. Ran into problem because her income is too high to go into facility.  Advised Dr Jaynee Eagles is out of the office until Monday but I will send her the message to see what she recommends at this point. She verbalized understanding.

## 2016-08-24 NOTE — Telephone Encounter (Signed)
Called and spoke to daughter. Relayed Dr Cathren Laine message about hospice. She wants to speak to family first and will call back with their decision. Advised I am not in the office tomorrow and we are only open until 12pm on Friday's. She verbalized understanding.

## 2016-08-28 NOTE — Telephone Encounter (Signed)
[name] has called back with the decision her along with family would like to have patient evaluated with hospice.  Please use [facility] as to where they live.  Please call

## 2016-08-28 NOTE — Addendum Note (Signed)
Addended by: Rossie Muskrat L on: 08/28/2016 11:27 AM   Modules accepted: Orders

## 2016-08-28 NOTE — Telephone Encounter (Signed)
[name]-  can you call daughter and let her know once referral sent? Thank you! I put in the referral notes where they want referral send and daughter's contact phone number, thank you!

## 2016-09-05 LAB — CUP PACEART REMOTE DEVICE CHECK
Date Time Interrogation Session: 20171219073746
MDC IDC PG IMPLANT DT: 20150326

## 2016-09-07 NOTE — Telephone Encounter (Signed)
[facility] will call Patient's daughter with all details . Hospice telephone [phone] - fax [phone].

## 2016-09-11 DIAGNOSIS — I509 Heart failure, unspecified: Secondary | ICD-10-CM | POA: Diagnosis not present

## 2016-09-11 DIAGNOSIS — E46 Unspecified protein-calorie malnutrition: Secondary | ICD-10-CM | POA: Diagnosis not present

## 2016-09-11 DIAGNOSIS — I10 Essential (primary) hypertension: Secondary | ICD-10-CM | POA: Diagnosis not present

## 2016-09-11 DIAGNOSIS — F0150 Vascular dementia without behavioral disturbance: Secondary | ICD-10-CM | POA: Diagnosis not present

## 2016-09-11 DIAGNOSIS — E118 Type 2 diabetes mellitus with unspecified complications: Secondary | ICD-10-CM | POA: Diagnosis not present

## 2016-09-11 DIAGNOSIS — R531 Weakness: Secondary | ICD-10-CM | POA: Diagnosis not present

## 2016-09-11 DIAGNOSIS — I6789 Other cerebrovascular disease: Secondary | ICD-10-CM | POA: Diagnosis not present

## 2016-09-11 DIAGNOSIS — I519 Heart disease, unspecified: Secondary | ICD-10-CM | POA: Diagnosis not present

## 2016-09-11 DIAGNOSIS — E785 Hyperlipidemia, unspecified: Secondary | ICD-10-CM | POA: Diagnosis not present

## 2016-09-11 DIAGNOSIS — I739 Peripheral vascular disease, unspecified: Secondary | ICD-10-CM | POA: Diagnosis not present

## 2016-09-11 DIAGNOSIS — G4089 Other seizures: Secondary | ICD-10-CM | POA: Diagnosis not present

## 2016-09-11 DIAGNOSIS — M0680 Other specified rheumatoid arthritis, unspecified site: Secondary | ICD-10-CM | POA: Diagnosis not present

## 2016-09-11 DIAGNOSIS — R1310 Dysphagia, unspecified: Secondary | ICD-10-CM | POA: Diagnosis not present

## 2016-09-13 ENCOUNTER — Telehealth: Payer: Self-pay | Admitting: Internal Medicine

## 2016-09-13 DIAGNOSIS — G4089 Other seizures: Secondary | ICD-10-CM | POA: Diagnosis not present

## 2016-09-13 DIAGNOSIS — E118 Type 2 diabetes mellitus with unspecified complications: Secondary | ICD-10-CM | POA: Diagnosis not present

## 2016-09-13 DIAGNOSIS — I6789 Other cerebrovascular disease: Secondary | ICD-10-CM | POA: Diagnosis not present

## 2016-09-13 DIAGNOSIS — E46 Unspecified protein-calorie malnutrition: Secondary | ICD-10-CM | POA: Diagnosis not present

## 2016-09-13 DIAGNOSIS — F0150 Vascular dementia without behavioral disturbance: Secondary | ICD-10-CM | POA: Diagnosis not present

## 2016-09-13 DIAGNOSIS — M0680 Other specified rheumatoid arthritis, unspecified site: Secondary | ICD-10-CM | POA: Diagnosis not present

## 2016-09-13 NOTE — Telephone Encounter (Signed)
New Message  Pt daughter call requesting to speak with RN. Pt daughter stays pt is bedridden and will not be able to come to any future appt. Pt daughter states pt has a health aid to assist the pt. Pt daughter would like to speak with RN. Please call back to discuss

## 2016-09-15 NOTE — Telephone Encounter (Signed)
Spoke with patient's daughter on Wed and she says her Mom is bed ridden and can not come to the office.  The only way she would be able to come is via EMS and that she feels would just be too much for her mom

## 2016-09-18 ENCOUNTER — Ambulatory Visit (INDEPENDENT_AMBULATORY_CARE_PROVIDER_SITE_OTHER): Payer: Medicare Other | Admitting: *Deleted

## 2016-09-18 DIAGNOSIS — R55 Syncope and collapse: Secondary | ICD-10-CM | POA: Diagnosis not present

## 2016-09-18 NOTE — Progress Notes (Signed)
[facility] Summary Report / Loop Recorder 

## 2016-09-20 DIAGNOSIS — F0150 Vascular dementia without behavioral disturbance: Secondary | ICD-10-CM | POA: Diagnosis not present

## 2016-09-20 DIAGNOSIS — M0680 Other specified rheumatoid arthritis, unspecified site: Secondary | ICD-10-CM | POA: Diagnosis not present

## 2016-09-20 DIAGNOSIS — E46 Unspecified protein-calorie malnutrition: Secondary | ICD-10-CM | POA: Diagnosis not present

## 2016-09-20 DIAGNOSIS — E118 Type 2 diabetes mellitus with unspecified complications: Secondary | ICD-10-CM | POA: Diagnosis not present

## 2016-09-20 DIAGNOSIS — G4089 Other seizures: Secondary | ICD-10-CM | POA: Diagnosis not present

## 2016-09-20 DIAGNOSIS — I6789 Other cerebrovascular disease: Secondary | ICD-10-CM | POA: Diagnosis not present

## 2016-09-21 LAB — CUP PACEART REMOTE DEVICE CHECK
Implantable Pulse Generator Implant Date: 20150326
MDC IDC SESS DTM: 20180118073636

## 2016-09-25 DIAGNOSIS — E118 Type 2 diabetes mellitus with unspecified complications: Secondary | ICD-10-CM | POA: Diagnosis not present

## 2016-09-25 DIAGNOSIS — I6789 Other cerebrovascular disease: Secondary | ICD-10-CM | POA: Diagnosis not present

## 2016-09-25 DIAGNOSIS — F0150 Vascular dementia without behavioral disturbance: Secondary | ICD-10-CM | POA: Diagnosis not present

## 2016-09-25 DIAGNOSIS — G4089 Other seizures: Secondary | ICD-10-CM | POA: Diagnosis not present

## 2016-09-25 DIAGNOSIS — E46 Unspecified protein-calorie malnutrition: Secondary | ICD-10-CM | POA: Diagnosis not present

## 2016-09-25 DIAGNOSIS — M0680 Other specified rheumatoid arthritis, unspecified site: Secondary | ICD-10-CM | POA: Diagnosis not present

## 2016-09-27 DIAGNOSIS — I6789 Other cerebrovascular disease: Secondary | ICD-10-CM | POA: Diagnosis not present

## 2016-09-27 DIAGNOSIS — M0680 Other specified rheumatoid arthritis, unspecified site: Secondary | ICD-10-CM | POA: Diagnosis not present

## 2016-09-27 DIAGNOSIS — F0150 Vascular dementia without behavioral disturbance: Secondary | ICD-10-CM | POA: Diagnosis not present

## 2016-09-27 DIAGNOSIS — E118 Type 2 diabetes mellitus with unspecified complications: Secondary | ICD-10-CM | POA: Diagnosis not present

## 2016-09-27 DIAGNOSIS — G4089 Other seizures: Secondary | ICD-10-CM | POA: Diagnosis not present

## 2016-09-27 DIAGNOSIS — E46 Unspecified protein-calorie malnutrition: Secondary | ICD-10-CM | POA: Diagnosis not present

## 2016-09-28 DIAGNOSIS — I10 Essential (primary) hypertension: Secondary | ICD-10-CM | POA: Diagnosis not present

## 2016-09-28 DIAGNOSIS — I739 Peripheral vascular disease, unspecified: Secondary | ICD-10-CM | POA: Diagnosis not present

## 2016-09-28 DIAGNOSIS — E118 Type 2 diabetes mellitus with unspecified complications: Secondary | ICD-10-CM | POA: Diagnosis not present

## 2016-09-28 DIAGNOSIS — E46 Unspecified protein-calorie malnutrition: Secondary | ICD-10-CM | POA: Diagnosis not present

## 2016-09-28 DIAGNOSIS — G4089 Other seizures: Secondary | ICD-10-CM | POA: Diagnosis not present

## 2016-09-28 DIAGNOSIS — R1310 Dysphagia, unspecified: Secondary | ICD-10-CM | POA: Diagnosis not present

## 2016-09-28 DIAGNOSIS — R531 Weakness: Secondary | ICD-10-CM | POA: Diagnosis not present

## 2016-09-28 DIAGNOSIS — I6789 Other cerebrovascular disease: Secondary | ICD-10-CM | POA: Diagnosis not present

## 2016-09-28 DIAGNOSIS — I509 Heart failure, unspecified: Secondary | ICD-10-CM | POA: Diagnosis not present

## 2016-09-28 DIAGNOSIS — E785 Hyperlipidemia, unspecified: Secondary | ICD-10-CM | POA: Diagnosis not present

## 2016-09-28 DIAGNOSIS — F0150 Vascular dementia without behavioral disturbance: Secondary | ICD-10-CM | POA: Diagnosis not present

## 2016-09-28 DIAGNOSIS — M0680 Other specified rheumatoid arthritis, unspecified site: Secondary | ICD-10-CM | POA: Diagnosis not present

## 2016-09-28 DIAGNOSIS — I519 Heart disease, unspecified: Secondary | ICD-10-CM | POA: Diagnosis not present

## 2016-10-02 DIAGNOSIS — E118 Type 2 diabetes mellitus with unspecified complications: Secondary | ICD-10-CM | POA: Diagnosis not present

## 2016-10-02 DIAGNOSIS — F0150 Vascular dementia without behavioral disturbance: Secondary | ICD-10-CM | POA: Diagnosis not present

## 2016-10-02 DIAGNOSIS — G4089 Other seizures: Secondary | ICD-10-CM | POA: Diagnosis not present

## 2016-10-02 DIAGNOSIS — M0680 Other specified rheumatoid arthritis, unspecified site: Secondary | ICD-10-CM | POA: Diagnosis not present

## 2016-10-02 DIAGNOSIS — I6789 Other cerebrovascular disease: Secondary | ICD-10-CM | POA: Diagnosis not present

## 2016-10-02 DIAGNOSIS — E46 Unspecified protein-calorie malnutrition: Secondary | ICD-10-CM | POA: Diagnosis not present

## 2016-10-05 ENCOUNTER — Telehealth: Payer: Self-pay

## 2016-10-05 NOTE — Telephone Encounter (Signed)
Orders signed for chaplain services from 09/29/16 through 12/09/16 and faxed back to [facility].

## 2016-10-06 DIAGNOSIS — E46 Unspecified protein-calorie malnutrition: Secondary | ICD-10-CM | POA: Diagnosis not present

## 2016-10-06 DIAGNOSIS — G4089 Other seizures: Secondary | ICD-10-CM | POA: Diagnosis not present

## 2016-10-06 DIAGNOSIS — I6789 Other cerebrovascular disease: Secondary | ICD-10-CM | POA: Diagnosis not present

## 2016-10-06 DIAGNOSIS — E118 Type 2 diabetes mellitus with unspecified complications: Secondary | ICD-10-CM | POA: Diagnosis not present

## 2016-10-06 DIAGNOSIS — M0680 Other specified rheumatoid arthritis, unspecified site: Secondary | ICD-10-CM | POA: Diagnosis not present

## 2016-10-06 DIAGNOSIS — F0150 Vascular dementia without behavioral disturbance: Secondary | ICD-10-CM | POA: Diagnosis not present

## 2016-10-10 DIAGNOSIS — I6789 Other cerebrovascular disease: Secondary | ICD-10-CM | POA: Diagnosis not present

## 2016-10-10 DIAGNOSIS — F0150 Vascular dementia without behavioral disturbance: Secondary | ICD-10-CM | POA: Diagnosis not present

## 2016-10-10 DIAGNOSIS — E46 Unspecified protein-calorie malnutrition: Secondary | ICD-10-CM | POA: Diagnosis not present

## 2016-10-10 DIAGNOSIS — G4089 Other seizures: Secondary | ICD-10-CM | POA: Diagnosis not present

## 2016-10-10 DIAGNOSIS — M0680 Other specified rheumatoid arthritis, unspecified site: Secondary | ICD-10-CM | POA: Diagnosis not present

## 2016-10-10 DIAGNOSIS — E118 Type 2 diabetes mellitus with unspecified complications: Secondary | ICD-10-CM | POA: Diagnosis not present

## 2016-10-10 LAB — CUP PACEART REMOTE DEVICE CHECK
Implantable Pulse Generator Implant Date: 20150326
MDC IDC SESS DTM: 20180217073725

## 2016-10-12 ENCOUNTER — Telehealth: Payer: Self-pay | Admitting: Cardiology

## 2016-10-12 DIAGNOSIS — G4089 Other seizures: Secondary | ICD-10-CM | POA: Diagnosis not present

## 2016-10-12 DIAGNOSIS — E118 Type 2 diabetes mellitus with unspecified complications: Secondary | ICD-10-CM | POA: Diagnosis not present

## 2016-10-12 DIAGNOSIS — F0150 Vascular dementia without behavioral disturbance: Secondary | ICD-10-CM | POA: Diagnosis not present

## 2016-10-12 DIAGNOSIS — M0680 Other specified rheumatoid arthritis, unspecified site: Secondary | ICD-10-CM | POA: Diagnosis not present

## 2016-10-12 DIAGNOSIS — E46 Unspecified protein-calorie malnutrition: Secondary | ICD-10-CM | POA: Diagnosis not present

## 2016-10-12 DIAGNOSIS — I6789 Other cerebrovascular disease: Secondary | ICD-10-CM | POA: Diagnosis not present

## 2016-10-12 NOTE — Telephone Encounter (Signed)
Spoke w/ pt caregiver and requested that pt send a remote transmission b/c pt home monitor has not updated in the last 14 days. Pt caregiver stated that she will tell pt daughter.

## 2016-10-16 ENCOUNTER — Other Ambulatory Visit: Payer: Self-pay | Admitting: Family Medicine

## 2016-10-16 ENCOUNTER — Ambulatory Visit (INDEPENDENT_AMBULATORY_CARE_PROVIDER_SITE_OTHER): Payer: Medicare Other | Admitting: *Deleted

## 2016-10-16 DIAGNOSIS — G4089 Other seizures: Secondary | ICD-10-CM | POA: Diagnosis not present

## 2016-10-16 DIAGNOSIS — E118 Type 2 diabetes mellitus with unspecified complications: Secondary | ICD-10-CM | POA: Diagnosis not present

## 2016-10-16 DIAGNOSIS — I6789 Other cerebrovascular disease: Secondary | ICD-10-CM | POA: Diagnosis not present

## 2016-10-16 DIAGNOSIS — E46 Unspecified protein-calorie malnutrition: Secondary | ICD-10-CM | POA: Diagnosis not present

## 2016-10-16 DIAGNOSIS — R55 Syncope and collapse: Secondary | ICD-10-CM

## 2016-10-16 DIAGNOSIS — F0150 Vascular dementia without behavioral disturbance: Secondary | ICD-10-CM | POA: Diagnosis not present

## 2016-10-16 DIAGNOSIS — M0680 Other specified rheumatoid arthritis, unspecified site: Secondary | ICD-10-CM | POA: Diagnosis not present

## 2016-10-16 NOTE — Progress Notes (Signed)
[facility] Summary Report / Loop Recorder 

## 2016-10-23 DIAGNOSIS — I6789 Other cerebrovascular disease: Secondary | ICD-10-CM | POA: Diagnosis not present

## 2016-10-23 DIAGNOSIS — F0150 Vascular dementia without behavioral disturbance: Secondary | ICD-10-CM | POA: Diagnosis not present

## 2016-10-23 DIAGNOSIS — M0680 Other specified rheumatoid arthritis, unspecified site: Secondary | ICD-10-CM | POA: Diagnosis not present

## 2016-10-23 DIAGNOSIS — E118 Type 2 diabetes mellitus with unspecified complications: Secondary | ICD-10-CM | POA: Diagnosis not present

## 2016-10-23 DIAGNOSIS — G4089 Other seizures: Secondary | ICD-10-CM | POA: Diagnosis not present

## 2016-10-23 DIAGNOSIS — E46 Unspecified protein-calorie malnutrition: Secondary | ICD-10-CM | POA: Diagnosis not present

## 2016-10-24 DIAGNOSIS — E46 Unspecified protein-calorie malnutrition: Secondary | ICD-10-CM | POA: Diagnosis not present

## 2016-10-24 DIAGNOSIS — F0150 Vascular dementia without behavioral disturbance: Secondary | ICD-10-CM | POA: Diagnosis not present

## 2016-10-24 DIAGNOSIS — I6789 Other cerebrovascular disease: Secondary | ICD-10-CM | POA: Diagnosis not present

## 2016-10-24 DIAGNOSIS — G4089 Other seizures: Secondary | ICD-10-CM | POA: Diagnosis not present

## 2016-10-24 DIAGNOSIS — E118 Type 2 diabetes mellitus with unspecified complications: Secondary | ICD-10-CM | POA: Diagnosis not present

## 2016-10-24 DIAGNOSIS — M0680 Other specified rheumatoid arthritis, unspecified site: Secondary | ICD-10-CM | POA: Diagnosis not present

## 2016-10-29 DIAGNOSIS — E46 Unspecified protein-calorie malnutrition: Secondary | ICD-10-CM | POA: Diagnosis not present

## 2016-10-29 DIAGNOSIS — E118 Type 2 diabetes mellitus with unspecified complications: Secondary | ICD-10-CM | POA: Diagnosis not present

## 2016-10-29 DIAGNOSIS — R1310 Dysphagia, unspecified: Secondary | ICD-10-CM | POA: Diagnosis not present

## 2016-10-29 DIAGNOSIS — I10 Essential (primary) hypertension: Secondary | ICD-10-CM | POA: Diagnosis not present

## 2016-10-29 DIAGNOSIS — F0150 Vascular dementia without behavioral disturbance: Secondary | ICD-10-CM | POA: Diagnosis not present

## 2016-10-29 DIAGNOSIS — I739 Peripheral vascular disease, unspecified: Secondary | ICD-10-CM | POA: Diagnosis not present

## 2016-10-29 DIAGNOSIS — M0680 Other specified rheumatoid arthritis, unspecified site: Secondary | ICD-10-CM | POA: Diagnosis not present

## 2016-10-29 DIAGNOSIS — E785 Hyperlipidemia, unspecified: Secondary | ICD-10-CM | POA: Diagnosis not present

## 2016-10-29 DIAGNOSIS — G4089 Other seizures: Secondary | ICD-10-CM | POA: Diagnosis not present

## 2016-10-29 DIAGNOSIS — I509 Heart failure, unspecified: Secondary | ICD-10-CM | POA: Diagnosis not present

## 2016-10-29 DIAGNOSIS — R531 Weakness: Secondary | ICD-10-CM | POA: Diagnosis not present

## 2016-10-29 DIAGNOSIS — I6789 Other cerebrovascular disease: Secondary | ICD-10-CM | POA: Diagnosis not present

## 2016-10-29 DIAGNOSIS — I519 Heart disease, unspecified: Secondary | ICD-10-CM | POA: Diagnosis not present

## 2016-10-29 LAB — CUP PACEART REMOTE DEVICE CHECK
Date Time Interrogation Session: 20180319080517
MDC IDC PG IMPLANT DT: 20150326

## 2016-10-31 DIAGNOSIS — M0680 Other specified rheumatoid arthritis, unspecified site: Secondary | ICD-10-CM | POA: Diagnosis not present

## 2016-10-31 DIAGNOSIS — F0150 Vascular dementia without behavioral disturbance: Secondary | ICD-10-CM | POA: Diagnosis not present

## 2016-10-31 DIAGNOSIS — E46 Unspecified protein-calorie malnutrition: Secondary | ICD-10-CM | POA: Diagnosis not present

## 2016-10-31 DIAGNOSIS — E118 Type 2 diabetes mellitus with unspecified complications: Secondary | ICD-10-CM | POA: Diagnosis not present

## 2016-10-31 DIAGNOSIS — I6789 Other cerebrovascular disease: Secondary | ICD-10-CM | POA: Diagnosis not present

## 2016-10-31 DIAGNOSIS — G4089 Other seizures: Secondary | ICD-10-CM | POA: Diagnosis not present

## 2016-11-03 DIAGNOSIS — M0680 Other specified rheumatoid arthritis, unspecified site: Secondary | ICD-10-CM | POA: Diagnosis not present

## 2016-11-03 DIAGNOSIS — E46 Unspecified protein-calorie malnutrition: Secondary | ICD-10-CM | POA: Diagnosis not present

## 2016-11-03 DIAGNOSIS — E118 Type 2 diabetes mellitus with unspecified complications: Secondary | ICD-10-CM | POA: Diagnosis not present

## 2016-11-03 DIAGNOSIS — I6789 Other cerebrovascular disease: Secondary | ICD-10-CM | POA: Diagnosis not present

## 2016-11-03 DIAGNOSIS — F0150 Vascular dementia without behavioral disturbance: Secondary | ICD-10-CM | POA: Diagnosis not present

## 2016-11-03 DIAGNOSIS — G4089 Other seizures: Secondary | ICD-10-CM | POA: Diagnosis not present

## 2016-11-07 DIAGNOSIS — E46 Unspecified protein-calorie malnutrition: Secondary | ICD-10-CM | POA: Diagnosis not present

## 2016-11-07 DIAGNOSIS — I6789 Other cerebrovascular disease: Secondary | ICD-10-CM | POA: Diagnosis not present

## 2016-11-07 DIAGNOSIS — E118 Type 2 diabetes mellitus with unspecified complications: Secondary | ICD-10-CM | POA: Diagnosis not present

## 2016-11-07 DIAGNOSIS — F0150 Vascular dementia without behavioral disturbance: Secondary | ICD-10-CM | POA: Diagnosis not present

## 2016-11-07 DIAGNOSIS — M0680 Other specified rheumatoid arthritis, unspecified site: Secondary | ICD-10-CM | POA: Diagnosis not present

## 2016-11-07 DIAGNOSIS — G4089 Other seizures: Secondary | ICD-10-CM | POA: Diagnosis not present

## 2016-11-08 ENCOUNTER — Telehealth: Payer: Self-pay

## 2016-11-08 NOTE — Telephone Encounter (Signed)
Received faxed request from pharmacy for Roxanol refill for Hospice pt.

## 2016-11-09 DIAGNOSIS — G4089 Other seizures: Secondary | ICD-10-CM | POA: Diagnosis not present

## 2016-11-09 DIAGNOSIS — E46 Unspecified protein-calorie malnutrition: Secondary | ICD-10-CM | POA: Diagnosis not present

## 2016-11-09 DIAGNOSIS — F0150 Vascular dementia without behavioral disturbance: Secondary | ICD-10-CM | POA: Diagnosis not present

## 2016-11-09 DIAGNOSIS — E118 Type 2 diabetes mellitus with unspecified complications: Secondary | ICD-10-CM | POA: Diagnosis not present

## 2016-11-09 DIAGNOSIS — I6789 Other cerebrovascular disease: Secondary | ICD-10-CM | POA: Diagnosis not present

## 2016-11-09 DIAGNOSIS — M0680 Other specified rheumatoid arthritis, unspecified site: Secondary | ICD-10-CM | POA: Diagnosis not present

## 2016-11-09 MED ORDER — MORPHINE SULFATE (CONCENTRATE) 20 MG/ML PO SOLN: 5.0000 mg | ORAL | 0 refills | Status: DC | PRN

## 2016-11-09 NOTE — Telephone Encounter (Signed)
Yes thanks 

## 2016-11-13 DIAGNOSIS — E118 Type 2 diabetes mellitus with unspecified complications: Secondary | ICD-10-CM | POA: Diagnosis not present

## 2016-11-13 DIAGNOSIS — F0150 Vascular dementia without behavioral disturbance: Secondary | ICD-10-CM | POA: Diagnosis not present

## 2016-11-13 DIAGNOSIS — M0680 Other specified rheumatoid arthritis, unspecified site: Secondary | ICD-10-CM | POA: Diagnosis not present

## 2016-11-13 DIAGNOSIS — I6789 Other cerebrovascular disease: Secondary | ICD-10-CM | POA: Diagnosis not present

## 2016-11-13 DIAGNOSIS — G4089 Other seizures: Secondary | ICD-10-CM | POA: Diagnosis not present

## 2016-11-13 DIAGNOSIS — E46 Unspecified protein-calorie malnutrition: Secondary | ICD-10-CM | POA: Diagnosis not present

## 2016-11-15 ENCOUNTER — Ambulatory Visit (INDEPENDENT_AMBULATORY_CARE_PROVIDER_SITE_OTHER): Payer: Medicare Other | Admitting: *Deleted

## 2016-11-15 DIAGNOSIS — R55 Syncope and collapse: Secondary | ICD-10-CM

## 2016-11-15 DIAGNOSIS — E118 Type 2 diabetes mellitus with unspecified complications: Secondary | ICD-10-CM | POA: Diagnosis not present

## 2016-11-15 DIAGNOSIS — F0150 Vascular dementia without behavioral disturbance: Secondary | ICD-10-CM | POA: Diagnosis not present

## 2016-11-15 DIAGNOSIS — M0680 Other specified rheumatoid arthritis, unspecified site: Secondary | ICD-10-CM | POA: Diagnosis not present

## 2016-11-15 DIAGNOSIS — I6789 Other cerebrovascular disease: Secondary | ICD-10-CM | POA: Diagnosis not present

## 2016-11-15 DIAGNOSIS — G4089 Other seizures: Secondary | ICD-10-CM | POA: Diagnosis not present

## 2016-11-15 DIAGNOSIS — E46 Unspecified protein-calorie malnutrition: Secondary | ICD-10-CM | POA: Diagnosis not present

## 2016-11-15 NOTE — Progress Notes (Signed)
[facility] Summary Report / Loop Recorder 

## 2016-11-15 NOTE — Telephone Encounter (Signed)
Rx printed, signed, faxed to pharmacy. 

## 2016-11-17 DIAGNOSIS — F0150 Vascular dementia without behavioral disturbance: Secondary | ICD-10-CM | POA: Diagnosis not present

## 2016-11-17 DIAGNOSIS — E46 Unspecified protein-calorie malnutrition: Secondary | ICD-10-CM | POA: Diagnosis not present

## 2016-11-17 DIAGNOSIS — M0680 Other specified rheumatoid arthritis, unspecified site: Secondary | ICD-10-CM | POA: Diagnosis not present

## 2016-11-17 DIAGNOSIS — I6789 Other cerebrovascular disease: Secondary | ICD-10-CM | POA: Diagnosis not present

## 2016-11-17 DIAGNOSIS — E118 Type 2 diabetes mellitus with unspecified complications: Secondary | ICD-10-CM | POA: Diagnosis not present

## 2016-11-17 DIAGNOSIS — G4089 Other seizures: Secondary | ICD-10-CM | POA: Diagnosis not present

## 2016-11-20 DIAGNOSIS — I6789 Other cerebrovascular disease: Secondary | ICD-10-CM | POA: Diagnosis not present

## 2016-11-20 DIAGNOSIS — M0680 Other specified rheumatoid arthritis, unspecified site: Secondary | ICD-10-CM | POA: Diagnosis not present

## 2016-11-20 DIAGNOSIS — E46 Unspecified protein-calorie malnutrition: Secondary | ICD-10-CM | POA: Diagnosis not present

## 2016-11-20 DIAGNOSIS — E118 Type 2 diabetes mellitus with unspecified complications: Secondary | ICD-10-CM | POA: Diagnosis not present

## 2016-11-20 DIAGNOSIS — G4089 Other seizures: Secondary | ICD-10-CM | POA: Diagnosis not present

## 2016-11-20 DIAGNOSIS — F0150 Vascular dementia without behavioral disturbance: Secondary | ICD-10-CM | POA: Diagnosis not present

## 2016-11-22 DIAGNOSIS — F0150 Vascular dementia without behavioral disturbance: Secondary | ICD-10-CM | POA: Diagnosis not present

## 2016-11-22 DIAGNOSIS — G4089 Other seizures: Secondary | ICD-10-CM | POA: Diagnosis not present

## 2016-11-22 DIAGNOSIS — M0680 Other specified rheumatoid arthritis, unspecified site: Secondary | ICD-10-CM | POA: Diagnosis not present

## 2016-11-22 DIAGNOSIS — I6789 Other cerebrovascular disease: Secondary | ICD-10-CM | POA: Diagnosis not present

## 2016-11-22 DIAGNOSIS — E118 Type 2 diabetes mellitus with unspecified complications: Secondary | ICD-10-CM | POA: Diagnosis not present

## 2016-11-22 DIAGNOSIS — E46 Unspecified protein-calorie malnutrition: Secondary | ICD-10-CM | POA: Diagnosis not present

## 2016-11-24 DIAGNOSIS — I6789 Other cerebrovascular disease: Secondary | ICD-10-CM | POA: Diagnosis not present

## 2016-11-24 DIAGNOSIS — F0150 Vascular dementia without behavioral disturbance: Secondary | ICD-10-CM | POA: Diagnosis not present

## 2016-11-24 DIAGNOSIS — E46 Unspecified protein-calorie malnutrition: Secondary | ICD-10-CM | POA: Diagnosis not present

## 2016-11-24 DIAGNOSIS — M0680 Other specified rheumatoid arthritis, unspecified site: Secondary | ICD-10-CM | POA: Diagnosis not present

## 2016-11-24 DIAGNOSIS — E118 Type 2 diabetes mellitus with unspecified complications: Secondary | ICD-10-CM | POA: Diagnosis not present

## 2016-11-24 DIAGNOSIS — G4089 Other seizures: Secondary | ICD-10-CM | POA: Diagnosis not present

## 2016-11-27 DIAGNOSIS — E118 Type 2 diabetes mellitus with unspecified complications: Secondary | ICD-10-CM | POA: Diagnosis not present

## 2016-11-27 DIAGNOSIS — M0680 Other specified rheumatoid arthritis, unspecified site: Secondary | ICD-10-CM | POA: Diagnosis not present

## 2016-11-27 DIAGNOSIS — I6789 Other cerebrovascular disease: Secondary | ICD-10-CM | POA: Diagnosis not present

## 2016-11-27 DIAGNOSIS — F0150 Vascular dementia without behavioral disturbance: Secondary | ICD-10-CM | POA: Diagnosis not present

## 2016-11-27 DIAGNOSIS — E46 Unspecified protein-calorie malnutrition: Secondary | ICD-10-CM | POA: Diagnosis not present

## 2016-11-27 DIAGNOSIS — G4089 Other seizures: Secondary | ICD-10-CM | POA: Diagnosis not present

## 2016-11-28 DIAGNOSIS — I509 Heart failure, unspecified: Secondary | ICD-10-CM | POA: Diagnosis not present

## 2016-11-28 DIAGNOSIS — I739 Peripheral vascular disease, unspecified: Secondary | ICD-10-CM | POA: Diagnosis not present

## 2016-11-28 DIAGNOSIS — G4089 Other seizures: Secondary | ICD-10-CM | POA: Diagnosis not present

## 2016-11-28 DIAGNOSIS — M0680 Other specified rheumatoid arthritis, unspecified site: Secondary | ICD-10-CM | POA: Diagnosis not present

## 2016-11-28 DIAGNOSIS — R1310 Dysphagia, unspecified: Secondary | ICD-10-CM | POA: Diagnosis not present

## 2016-11-28 DIAGNOSIS — E785 Hyperlipidemia, unspecified: Secondary | ICD-10-CM | POA: Diagnosis not present

## 2016-11-28 DIAGNOSIS — F0150 Vascular dementia without behavioral disturbance: Secondary | ICD-10-CM | POA: Diagnosis not present

## 2016-11-28 DIAGNOSIS — I6789 Other cerebrovascular disease: Secondary | ICD-10-CM | POA: Diagnosis not present

## 2016-11-28 DIAGNOSIS — R531 Weakness: Secondary | ICD-10-CM | POA: Diagnosis not present

## 2016-11-28 DIAGNOSIS — E46 Unspecified protein-calorie malnutrition: Secondary | ICD-10-CM | POA: Diagnosis not present

## 2016-11-28 DIAGNOSIS — I10 Essential (primary) hypertension: Secondary | ICD-10-CM | POA: Diagnosis not present

## 2016-11-28 DIAGNOSIS — E118 Type 2 diabetes mellitus with unspecified complications: Secondary | ICD-10-CM | POA: Diagnosis not present

## 2016-11-28 DIAGNOSIS — I519 Heart disease, unspecified: Secondary | ICD-10-CM | POA: Diagnosis not present

## 2016-11-29 DIAGNOSIS — F0150 Vascular dementia without behavioral disturbance: Secondary | ICD-10-CM | POA: Diagnosis not present

## 2016-11-29 DIAGNOSIS — M0680 Other specified rheumatoid arthritis, unspecified site: Secondary | ICD-10-CM | POA: Diagnosis not present

## 2016-11-29 DIAGNOSIS — E46 Unspecified protein-calorie malnutrition: Secondary | ICD-10-CM | POA: Diagnosis not present

## 2016-11-29 DIAGNOSIS — I6789 Other cerebrovascular disease: Secondary | ICD-10-CM | POA: Diagnosis not present

## 2016-11-29 DIAGNOSIS — G4089 Other seizures: Secondary | ICD-10-CM | POA: Diagnosis not present

## 2016-11-29 DIAGNOSIS — E118 Type 2 diabetes mellitus with unspecified complications: Secondary | ICD-10-CM | POA: Diagnosis not present

## 2016-12-01 DIAGNOSIS — F0150 Vascular dementia without behavioral disturbance: Secondary | ICD-10-CM | POA: Diagnosis not present

## 2016-12-01 DIAGNOSIS — G4089 Other seizures: Secondary | ICD-10-CM | POA: Diagnosis not present

## 2016-12-01 DIAGNOSIS — I6789 Other cerebrovascular disease: Secondary | ICD-10-CM | POA: Diagnosis not present

## 2016-12-01 DIAGNOSIS — E118 Type 2 diabetes mellitus with unspecified complications: Secondary | ICD-10-CM | POA: Diagnosis not present

## 2016-12-01 DIAGNOSIS — M0680 Other specified rheumatoid arthritis, unspecified site: Secondary | ICD-10-CM | POA: Diagnosis not present

## 2016-12-01 DIAGNOSIS — E46 Unspecified protein-calorie malnutrition: Secondary | ICD-10-CM | POA: Diagnosis not present

## 2016-12-04 ENCOUNTER — Other Ambulatory Visit: Payer: Self-pay | Admitting: Family Medicine

## 2016-12-04 DIAGNOSIS — E46 Unspecified protein-calorie malnutrition: Secondary | ICD-10-CM | POA: Diagnosis not present

## 2016-12-04 DIAGNOSIS — E118 Type 2 diabetes mellitus with unspecified complications: Secondary | ICD-10-CM | POA: Diagnosis not present

## 2016-12-04 DIAGNOSIS — I6789 Other cerebrovascular disease: Secondary | ICD-10-CM | POA: Diagnosis not present

## 2016-12-04 DIAGNOSIS — F0150 Vascular dementia without behavioral disturbance: Secondary | ICD-10-CM | POA: Diagnosis not present

## 2016-12-04 DIAGNOSIS — M0680 Other specified rheumatoid arthritis, unspecified site: Secondary | ICD-10-CM | POA: Diagnosis not present

## 2016-12-04 DIAGNOSIS — G4089 Other seizures: Secondary | ICD-10-CM | POA: Diagnosis not present

## 2016-12-06 ENCOUNTER — Other Ambulatory Visit: Payer: Self-pay

## 2016-12-06 DIAGNOSIS — M0680 Other specified rheumatoid arthritis, unspecified site: Secondary | ICD-10-CM | POA: Diagnosis not present

## 2016-12-06 DIAGNOSIS — F0150 Vascular dementia without behavioral disturbance: Secondary | ICD-10-CM | POA: Diagnosis not present

## 2016-12-06 DIAGNOSIS — E118 Type 2 diabetes mellitus with unspecified complications: Secondary | ICD-10-CM | POA: Diagnosis not present

## 2016-12-06 DIAGNOSIS — G4089 Other seizures: Secondary | ICD-10-CM | POA: Diagnosis not present

## 2016-12-06 DIAGNOSIS — E46 Unspecified protein-calorie malnutrition: Secondary | ICD-10-CM | POA: Diagnosis not present

## 2016-12-06 DIAGNOSIS — I6789 Other cerebrovascular disease: Secondary | ICD-10-CM | POA: Diagnosis not present

## 2016-12-06 LAB — CUP PACEART REMOTE DEVICE CHECK
Date Time Interrogation Session: 20180418083644
Implantable Pulse Generator Implant Date: 20150326

## 2016-12-06 NOTE — Telephone Encounter (Signed)
Received faxed request from pharmacy for med refill.

## 2016-12-07 MED ORDER — MORPHINE SULFATE (CONCENTRATE) 20 MG/ML PO SOLN: 5.0000 mg | ORAL | 0 refills | Status: DC | PRN

## 2016-12-07 NOTE — Telephone Encounter (Signed)
Rx signed and faxed to pharmacy

## 2016-12-07 NOTE — Telephone Encounter (Signed)
Rx printed, awaiting MD review/signature.

## 2016-12-08 DIAGNOSIS — M0680 Other specified rheumatoid arthritis, unspecified site: Secondary | ICD-10-CM | POA: Diagnosis not present

## 2016-12-08 DIAGNOSIS — E118 Type 2 diabetes mellitus with unspecified complications: Secondary | ICD-10-CM | POA: Diagnosis not present

## 2016-12-08 DIAGNOSIS — G4089 Other seizures: Secondary | ICD-10-CM | POA: Diagnosis not present

## 2016-12-08 DIAGNOSIS — I6789 Other cerebrovascular disease: Secondary | ICD-10-CM | POA: Diagnosis not present

## 2016-12-08 DIAGNOSIS — F0150 Vascular dementia without behavioral disturbance: Secondary | ICD-10-CM | POA: Diagnosis not present

## 2016-12-08 DIAGNOSIS — E46 Unspecified protein-calorie malnutrition: Secondary | ICD-10-CM | POA: Diagnosis not present

## 2016-12-11 DIAGNOSIS — F0150 Vascular dementia without behavioral disturbance: Secondary | ICD-10-CM | POA: Diagnosis not present

## 2016-12-11 DIAGNOSIS — M0680 Other specified rheumatoid arthritis, unspecified site: Secondary | ICD-10-CM | POA: Diagnosis not present

## 2016-12-11 DIAGNOSIS — E46 Unspecified protein-calorie malnutrition: Secondary | ICD-10-CM | POA: Diagnosis not present

## 2016-12-11 DIAGNOSIS — G4089 Other seizures: Secondary | ICD-10-CM | POA: Diagnosis not present

## 2016-12-11 DIAGNOSIS — E118 Type 2 diabetes mellitus with unspecified complications: Secondary | ICD-10-CM | POA: Diagnosis not present

## 2016-12-11 DIAGNOSIS — I6789 Other cerebrovascular disease: Secondary | ICD-10-CM | POA: Diagnosis not present

## 2016-12-15 ENCOUNTER — Ambulatory Visit (INDEPENDENT_AMBULATORY_CARE_PROVIDER_SITE_OTHER): Payer: Medicare Other | Admitting: *Deleted

## 2016-12-15 DIAGNOSIS — F0150 Vascular dementia without behavioral disturbance: Secondary | ICD-10-CM | POA: Diagnosis not present

## 2016-12-15 DIAGNOSIS — E46 Unspecified protein-calorie malnutrition: Secondary | ICD-10-CM | POA: Diagnosis not present

## 2016-12-15 DIAGNOSIS — R55 Syncope and collapse: Secondary | ICD-10-CM | POA: Diagnosis not present

## 2016-12-15 DIAGNOSIS — G4089 Other seizures: Secondary | ICD-10-CM | POA: Diagnosis not present

## 2016-12-15 DIAGNOSIS — E118 Type 2 diabetes mellitus with unspecified complications: Secondary | ICD-10-CM | POA: Diagnosis not present

## 2016-12-15 DIAGNOSIS — M0680 Other specified rheumatoid arthritis, unspecified site: Secondary | ICD-10-CM | POA: Diagnosis not present

## 2016-12-15 DIAGNOSIS — I6789 Other cerebrovascular disease: Secondary | ICD-10-CM | POA: Diagnosis not present

## 2016-12-15 NOTE — Progress Notes (Signed)
[facility] Summary Report / Loop Recorder 

## 2016-12-18 DIAGNOSIS — F0150 Vascular dementia without behavioral disturbance: Secondary | ICD-10-CM | POA: Diagnosis not present

## 2016-12-18 DIAGNOSIS — E46 Unspecified protein-calorie malnutrition: Secondary | ICD-10-CM | POA: Diagnosis not present

## 2016-12-18 DIAGNOSIS — M0680 Other specified rheumatoid arthritis, unspecified site: Secondary | ICD-10-CM | POA: Diagnosis not present

## 2016-12-18 DIAGNOSIS — I6789 Other cerebrovascular disease: Secondary | ICD-10-CM | POA: Diagnosis not present

## 2016-12-18 DIAGNOSIS — E118 Type 2 diabetes mellitus with unspecified complications: Secondary | ICD-10-CM | POA: Diagnosis not present

## 2016-12-18 DIAGNOSIS — G4089 Other seizures: Secondary | ICD-10-CM | POA: Diagnosis not present

## 2016-12-20 DIAGNOSIS — E118 Type 2 diabetes mellitus with unspecified complications: Secondary | ICD-10-CM | POA: Diagnosis not present

## 2016-12-20 DIAGNOSIS — I6789 Other cerebrovascular disease: Secondary | ICD-10-CM | POA: Diagnosis not present

## 2016-12-20 DIAGNOSIS — F0150 Vascular dementia without behavioral disturbance: Secondary | ICD-10-CM | POA: Diagnosis not present

## 2016-12-20 DIAGNOSIS — E46 Unspecified protein-calorie malnutrition: Secondary | ICD-10-CM | POA: Diagnosis not present

## 2016-12-20 DIAGNOSIS — M0680 Other specified rheumatoid arthritis, unspecified site: Secondary | ICD-10-CM | POA: Diagnosis not present

## 2016-12-20 DIAGNOSIS — G4089 Other seizures: Secondary | ICD-10-CM | POA: Diagnosis not present

## 2016-12-25 DIAGNOSIS — E46 Unspecified protein-calorie malnutrition: Secondary | ICD-10-CM | POA: Diagnosis not present

## 2016-12-25 DIAGNOSIS — I6789 Other cerebrovascular disease: Secondary | ICD-10-CM | POA: Diagnosis not present

## 2016-12-25 DIAGNOSIS — G4089 Other seizures: Secondary | ICD-10-CM | POA: Diagnosis not present

## 2016-12-25 DIAGNOSIS — E118 Type 2 diabetes mellitus with unspecified complications: Secondary | ICD-10-CM | POA: Diagnosis not present

## 2016-12-25 DIAGNOSIS — M0680 Other specified rheumatoid arthritis, unspecified site: Secondary | ICD-10-CM | POA: Diagnosis not present

## 2016-12-25 DIAGNOSIS — F0150 Vascular dementia without behavioral disturbance: Secondary | ICD-10-CM | POA: Diagnosis not present

## 2016-12-25 LAB — CUP PACEART REMOTE DEVICE CHECK
Date Time Interrogation Session: 20180518093602
MDC IDC PG IMPLANT DT: 20150326

## 2016-12-27 DIAGNOSIS — F0150 Vascular dementia without behavioral disturbance: Secondary | ICD-10-CM | POA: Diagnosis not present

## 2016-12-27 DIAGNOSIS — M0680 Other specified rheumatoid arthritis, unspecified site: Secondary | ICD-10-CM | POA: Diagnosis not present

## 2016-12-27 DIAGNOSIS — E118 Type 2 diabetes mellitus with unspecified complications: Secondary | ICD-10-CM | POA: Diagnosis not present

## 2016-12-27 DIAGNOSIS — I6789 Other cerebrovascular disease: Secondary | ICD-10-CM | POA: Diagnosis not present

## 2016-12-27 DIAGNOSIS — E46 Unspecified protein-calorie malnutrition: Secondary | ICD-10-CM | POA: Diagnosis not present

## 2016-12-27 DIAGNOSIS — G4089 Other seizures: Secondary | ICD-10-CM | POA: Diagnosis not present

## 2016-12-29 DIAGNOSIS — E118 Type 2 diabetes mellitus with unspecified complications: Secondary | ICD-10-CM | POA: Diagnosis not present

## 2016-12-29 DIAGNOSIS — R1310 Dysphagia, unspecified: Secondary | ICD-10-CM | POA: Diagnosis not present

## 2016-12-29 DIAGNOSIS — E46 Unspecified protein-calorie malnutrition: Secondary | ICD-10-CM | POA: Diagnosis not present

## 2016-12-29 DIAGNOSIS — I6789 Other cerebrovascular disease: Secondary | ICD-10-CM | POA: Diagnosis not present

## 2016-12-29 DIAGNOSIS — R531 Weakness: Secondary | ICD-10-CM | POA: Diagnosis not present

## 2016-12-29 DIAGNOSIS — I509 Heart failure, unspecified: Secondary | ICD-10-CM | POA: Diagnosis not present

## 2016-12-29 DIAGNOSIS — I739 Peripheral vascular disease, unspecified: Secondary | ICD-10-CM | POA: Diagnosis not present

## 2016-12-29 DIAGNOSIS — I519 Heart disease, unspecified: Secondary | ICD-10-CM | POA: Diagnosis not present

## 2016-12-29 DIAGNOSIS — I10 Essential (primary) hypertension: Secondary | ICD-10-CM | POA: Diagnosis not present

## 2016-12-29 DIAGNOSIS — F0150 Vascular dementia without behavioral disturbance: Secondary | ICD-10-CM | POA: Diagnosis not present

## 2016-12-29 DIAGNOSIS — G4089 Other seizures: Secondary | ICD-10-CM | POA: Diagnosis not present

## 2016-12-29 DIAGNOSIS — E785 Hyperlipidemia, unspecified: Secondary | ICD-10-CM | POA: Diagnosis not present

## 2016-12-29 DIAGNOSIS — M0680 Other specified rheumatoid arthritis, unspecified site: Secondary | ICD-10-CM | POA: Diagnosis not present

## 2017-01-01 DIAGNOSIS — E46 Unspecified protein-calorie malnutrition: Secondary | ICD-10-CM | POA: Diagnosis not present

## 2017-01-01 DIAGNOSIS — E118 Type 2 diabetes mellitus with unspecified complications: Secondary | ICD-10-CM | POA: Diagnosis not present

## 2017-01-01 DIAGNOSIS — M0680 Other specified rheumatoid arthritis, unspecified site: Secondary | ICD-10-CM | POA: Diagnosis not present

## 2017-01-01 DIAGNOSIS — I6789 Other cerebrovascular disease: Secondary | ICD-10-CM | POA: Diagnosis not present

## 2017-01-01 DIAGNOSIS — F0150 Vascular dementia without behavioral disturbance: Secondary | ICD-10-CM | POA: Diagnosis not present

## 2017-01-01 DIAGNOSIS — G4089 Other seizures: Secondary | ICD-10-CM | POA: Diagnosis not present

## 2017-01-03 DIAGNOSIS — M0680 Other specified rheumatoid arthritis, unspecified site: Secondary | ICD-10-CM | POA: Diagnosis not present

## 2017-01-03 DIAGNOSIS — E46 Unspecified protein-calorie malnutrition: Secondary | ICD-10-CM | POA: Diagnosis not present

## 2017-01-03 DIAGNOSIS — F0150 Vascular dementia without behavioral disturbance: Secondary | ICD-10-CM | POA: Diagnosis not present

## 2017-01-03 DIAGNOSIS — G4089 Other seizures: Secondary | ICD-10-CM | POA: Diagnosis not present

## 2017-01-03 DIAGNOSIS — I6789 Other cerebrovascular disease: Secondary | ICD-10-CM | POA: Diagnosis not present

## 2017-01-03 DIAGNOSIS — E118 Type 2 diabetes mellitus with unspecified complications: Secondary | ICD-10-CM | POA: Diagnosis not present

## 2017-01-08 DIAGNOSIS — E118 Type 2 diabetes mellitus with unspecified complications: Secondary | ICD-10-CM | POA: Diagnosis not present

## 2017-01-08 DIAGNOSIS — M0680 Other specified rheumatoid arthritis, unspecified site: Secondary | ICD-10-CM | POA: Diagnosis not present

## 2017-01-08 DIAGNOSIS — E46 Unspecified protein-calorie malnutrition: Secondary | ICD-10-CM | POA: Diagnosis not present

## 2017-01-08 DIAGNOSIS — G4089 Other seizures: Secondary | ICD-10-CM | POA: Diagnosis not present

## 2017-01-08 DIAGNOSIS — I6789 Other cerebrovascular disease: Secondary | ICD-10-CM | POA: Diagnosis not present

## 2017-01-08 DIAGNOSIS — F0150 Vascular dementia without behavioral disturbance: Secondary | ICD-10-CM | POA: Diagnosis not present

## 2017-01-10 DIAGNOSIS — I6789 Other cerebrovascular disease: Secondary | ICD-10-CM | POA: Diagnosis not present

## 2017-01-10 DIAGNOSIS — G4089 Other seizures: Secondary | ICD-10-CM | POA: Diagnosis not present

## 2017-01-10 DIAGNOSIS — E46 Unspecified protein-calorie malnutrition: Secondary | ICD-10-CM | POA: Diagnosis not present

## 2017-01-10 DIAGNOSIS — E118 Type 2 diabetes mellitus with unspecified complications: Secondary | ICD-10-CM | POA: Diagnosis not present

## 2017-01-10 DIAGNOSIS — F0150 Vascular dementia without behavioral disturbance: Secondary | ICD-10-CM | POA: Diagnosis not present

## 2017-01-10 DIAGNOSIS — M0680 Other specified rheumatoid arthritis, unspecified site: Secondary | ICD-10-CM | POA: Diagnosis not present

## 2017-01-15 ENCOUNTER — Ambulatory Visit (INDEPENDENT_AMBULATORY_CARE_PROVIDER_SITE_OTHER): Payer: Medicare Other | Admitting: *Deleted

## 2017-01-15 DIAGNOSIS — M0680 Other specified rheumatoid arthritis, unspecified site: Secondary | ICD-10-CM | POA: Diagnosis not present

## 2017-01-15 DIAGNOSIS — E46 Unspecified protein-calorie malnutrition: Secondary | ICD-10-CM | POA: Diagnosis not present

## 2017-01-15 DIAGNOSIS — I6789 Other cerebrovascular disease: Secondary | ICD-10-CM | POA: Diagnosis not present

## 2017-01-15 DIAGNOSIS — F0150 Vascular dementia without behavioral disturbance: Secondary | ICD-10-CM | POA: Diagnosis not present

## 2017-01-15 DIAGNOSIS — G4089 Other seizures: Secondary | ICD-10-CM | POA: Diagnosis not present

## 2017-01-15 DIAGNOSIS — R55 Syncope and collapse: Secondary | ICD-10-CM | POA: Diagnosis not present

## 2017-01-15 DIAGNOSIS — E118 Type 2 diabetes mellitus with unspecified complications: Secondary | ICD-10-CM | POA: Diagnosis not present

## 2017-01-15 NOTE — Progress Notes (Signed)
[facility] Summary Report / Loop Recorder 

## 2017-01-16 DIAGNOSIS — G4089 Other seizures: Secondary | ICD-10-CM | POA: Diagnosis not present

## 2017-01-16 DIAGNOSIS — E118 Type 2 diabetes mellitus with unspecified complications: Secondary | ICD-10-CM | POA: Diagnosis not present

## 2017-01-16 DIAGNOSIS — E46 Unspecified protein-calorie malnutrition: Secondary | ICD-10-CM | POA: Diagnosis not present

## 2017-01-16 DIAGNOSIS — I6789 Other cerebrovascular disease: Secondary | ICD-10-CM | POA: Diagnosis not present

## 2017-01-16 DIAGNOSIS — M0680 Other specified rheumatoid arthritis, unspecified site: Secondary | ICD-10-CM | POA: Diagnosis not present

## 2017-01-16 DIAGNOSIS — F0150 Vascular dementia without behavioral disturbance: Secondary | ICD-10-CM | POA: Diagnosis not present

## 2017-01-17 ENCOUNTER — Other Ambulatory Visit: Payer: Self-pay

## 2017-01-17 DIAGNOSIS — I6789 Other cerebrovascular disease: Secondary | ICD-10-CM | POA: Diagnosis not present

## 2017-01-17 DIAGNOSIS — F0150 Vascular dementia without behavioral disturbance: Secondary | ICD-10-CM | POA: Diagnosis not present

## 2017-01-17 DIAGNOSIS — E46 Unspecified protein-calorie malnutrition: Secondary | ICD-10-CM | POA: Diagnosis not present

## 2017-01-17 DIAGNOSIS — G4089 Other seizures: Secondary | ICD-10-CM | POA: Diagnosis not present

## 2017-01-17 DIAGNOSIS — M0680 Other specified rheumatoid arthritis, unspecified site: Secondary | ICD-10-CM | POA: Diagnosis not present

## 2017-01-17 DIAGNOSIS — E118 Type 2 diabetes mellitus with unspecified complications: Secondary | ICD-10-CM | POA: Diagnosis not present

## 2017-01-17 MED ORDER — MORPHINE SULFATE (CONCENTRATE) 20 MG/ML PO SOLN: 5.0000 mg | ORAL | 0 refills | Status: AC | PRN

## 2017-01-17 NOTE — Telephone Encounter (Signed)
Rx printed, awaiting signature. 

## 2017-01-18 ENCOUNTER — Telehealth: Payer: Self-pay | Admitting: *Deleted

## 2017-01-18 NOTE — Telephone Encounter (Signed)
Dr. Jaynee Eagles has signed a prescription for morphine for this patient.  I have called [facility] [phone]) and asked how they would Iike to obtain this prescription from our office.  I am expecting a call from her Hospice nurse, Manuela Schwartz, to return my call to clarify the process.

## 2017-01-18 NOTE — Telephone Encounter (Signed)
Spoke to [place], Therapist, sports at [facility] - states a printed prescription for morphine is not needed because they have standing orders on file.  Additionally, the patient has not required morphine in several weeks.  The prescription printed on 01/17/17 by Anderson Malta for Dr. Jaynee Eagles has been destroyed.

## 2017-01-22 DIAGNOSIS — E46 Unspecified protein-calorie malnutrition: Secondary | ICD-10-CM | POA: Diagnosis not present

## 2017-01-22 DIAGNOSIS — G4089 Other seizures: Secondary | ICD-10-CM | POA: Diagnosis not present

## 2017-01-22 DIAGNOSIS — M0680 Other specified rheumatoid arthritis, unspecified site: Secondary | ICD-10-CM | POA: Diagnosis not present

## 2017-01-22 DIAGNOSIS — E118 Type 2 diabetes mellitus with unspecified complications: Secondary | ICD-10-CM | POA: Diagnosis not present

## 2017-01-22 DIAGNOSIS — F0150 Vascular dementia without behavioral disturbance: Secondary | ICD-10-CM | POA: Diagnosis not present

## 2017-01-22 DIAGNOSIS — I6789 Other cerebrovascular disease: Secondary | ICD-10-CM | POA: Diagnosis not present

## 2017-01-24 DIAGNOSIS — M0680 Other specified rheumatoid arthritis, unspecified site: Secondary | ICD-10-CM | POA: Diagnosis not present

## 2017-01-24 DIAGNOSIS — E46 Unspecified protein-calorie malnutrition: Secondary | ICD-10-CM | POA: Diagnosis not present

## 2017-01-24 DIAGNOSIS — G4089 Other seizures: Secondary | ICD-10-CM | POA: Diagnosis not present

## 2017-01-24 DIAGNOSIS — I6789 Other cerebrovascular disease: Secondary | ICD-10-CM | POA: Diagnosis not present

## 2017-01-24 DIAGNOSIS — E118 Type 2 diabetes mellitus with unspecified complications: Secondary | ICD-10-CM | POA: Diagnosis not present

## 2017-01-24 DIAGNOSIS — F0150 Vascular dementia without behavioral disturbance: Secondary | ICD-10-CM | POA: Diagnosis not present

## 2017-01-24 LAB — CUP PACEART REMOTE DEVICE CHECK
Implantable Pulse Generator Implant Date: 20150326
MDC IDC SESS DTM: 20180617093707

## 2017-01-25 DIAGNOSIS — M0680 Other specified rheumatoid arthritis, unspecified site: Secondary | ICD-10-CM | POA: Diagnosis not present

## 2017-01-25 DIAGNOSIS — I6789 Other cerebrovascular disease: Secondary | ICD-10-CM | POA: Diagnosis not present

## 2017-01-25 DIAGNOSIS — F0150 Vascular dementia without behavioral disturbance: Secondary | ICD-10-CM | POA: Diagnosis not present

## 2017-01-25 DIAGNOSIS — E118 Type 2 diabetes mellitus with unspecified complications: Secondary | ICD-10-CM | POA: Diagnosis not present

## 2017-01-25 DIAGNOSIS — E46 Unspecified protein-calorie malnutrition: Secondary | ICD-10-CM | POA: Diagnosis not present

## 2017-01-25 DIAGNOSIS — G4089 Other seizures: Secondary | ICD-10-CM | POA: Diagnosis not present

## 2017-01-27 ENCOUNTER — Other Ambulatory Visit: Payer: Self-pay | Admitting: Family Medicine

## 2017-01-28 DIAGNOSIS — M0680 Other specified rheumatoid arthritis, unspecified site: Secondary | ICD-10-CM | POA: Diagnosis not present

## 2017-01-28 DIAGNOSIS — R531 Weakness: Secondary | ICD-10-CM | POA: Diagnosis not present

## 2017-01-28 DIAGNOSIS — I6789 Other cerebrovascular disease: Secondary | ICD-10-CM | POA: Diagnosis not present

## 2017-01-28 DIAGNOSIS — I10 Essential (primary) hypertension: Secondary | ICD-10-CM | POA: Diagnosis not present

## 2017-01-28 DIAGNOSIS — I519 Heart disease, unspecified: Secondary | ICD-10-CM | POA: Diagnosis not present

## 2017-01-28 DIAGNOSIS — E46 Unspecified protein-calorie malnutrition: Secondary | ICD-10-CM | POA: Diagnosis not present

## 2017-01-28 DIAGNOSIS — E785 Hyperlipidemia, unspecified: Secondary | ICD-10-CM | POA: Diagnosis not present

## 2017-01-28 DIAGNOSIS — R1310 Dysphagia, unspecified: Secondary | ICD-10-CM | POA: Diagnosis not present

## 2017-01-28 DIAGNOSIS — G4089 Other seizures: Secondary | ICD-10-CM | POA: Diagnosis not present

## 2017-01-28 DIAGNOSIS — F0150 Vascular dementia without behavioral disturbance: Secondary | ICD-10-CM | POA: Diagnosis not present

## 2017-01-28 DIAGNOSIS — E118 Type 2 diabetes mellitus with unspecified complications: Secondary | ICD-10-CM | POA: Diagnosis not present

## 2017-01-28 DIAGNOSIS — I739 Peripheral vascular disease, unspecified: Secondary | ICD-10-CM | POA: Diagnosis not present

## 2017-01-28 DIAGNOSIS — I509 Heart failure, unspecified: Secondary | ICD-10-CM | POA: Diagnosis not present

## 2017-01-29 DIAGNOSIS — I6789 Other cerebrovascular disease: Secondary | ICD-10-CM | POA: Diagnosis not present

## 2017-01-29 DIAGNOSIS — F0150 Vascular dementia without behavioral disturbance: Secondary | ICD-10-CM | POA: Diagnosis not present

## 2017-01-29 DIAGNOSIS — M0680 Other specified rheumatoid arthritis, unspecified site: Secondary | ICD-10-CM | POA: Diagnosis not present

## 2017-01-29 DIAGNOSIS — G4089 Other seizures: Secondary | ICD-10-CM | POA: Diagnosis not present

## 2017-01-29 DIAGNOSIS — E46 Unspecified protein-calorie malnutrition: Secondary | ICD-10-CM | POA: Diagnosis not present

## 2017-01-29 DIAGNOSIS — E118 Type 2 diabetes mellitus with unspecified complications: Secondary | ICD-10-CM | POA: Diagnosis not present

## 2017-01-30 DIAGNOSIS — G4089 Other seizures: Secondary | ICD-10-CM | POA: Diagnosis not present

## 2017-01-30 DIAGNOSIS — E118 Type 2 diabetes mellitus with unspecified complications: Secondary | ICD-10-CM | POA: Diagnosis not present

## 2017-01-30 DIAGNOSIS — I6789 Other cerebrovascular disease: Secondary | ICD-10-CM | POA: Diagnosis not present

## 2017-01-30 DIAGNOSIS — E46 Unspecified protein-calorie malnutrition: Secondary | ICD-10-CM | POA: Diagnosis not present

## 2017-01-30 DIAGNOSIS — F0150 Vascular dementia without behavioral disturbance: Secondary | ICD-10-CM | POA: Diagnosis not present

## 2017-01-30 DIAGNOSIS — M0680 Other specified rheumatoid arthritis, unspecified site: Secondary | ICD-10-CM | POA: Diagnosis not present

## 2017-02-05 DIAGNOSIS — F0150 Vascular dementia without behavioral disturbance: Secondary | ICD-10-CM | POA: Diagnosis not present

## 2017-02-05 DIAGNOSIS — E46 Unspecified protein-calorie malnutrition: Secondary | ICD-10-CM | POA: Diagnosis not present

## 2017-02-05 DIAGNOSIS — G4089 Other seizures: Secondary | ICD-10-CM | POA: Diagnosis not present

## 2017-02-05 DIAGNOSIS — E118 Type 2 diabetes mellitus with unspecified complications: Secondary | ICD-10-CM | POA: Diagnosis not present

## 2017-02-05 DIAGNOSIS — M0680 Other specified rheumatoid arthritis, unspecified site: Secondary | ICD-10-CM | POA: Diagnosis not present

## 2017-02-05 DIAGNOSIS — I6789 Other cerebrovascular disease: Secondary | ICD-10-CM | POA: Diagnosis not present

## 2017-02-07 DIAGNOSIS — E118 Type 2 diabetes mellitus with unspecified complications: Secondary | ICD-10-CM | POA: Diagnosis not present

## 2017-02-07 DIAGNOSIS — M0680 Other specified rheumatoid arthritis, unspecified site: Secondary | ICD-10-CM | POA: Diagnosis not present

## 2017-02-07 DIAGNOSIS — G4089 Other seizures: Secondary | ICD-10-CM | POA: Diagnosis not present

## 2017-02-07 DIAGNOSIS — I6789 Other cerebrovascular disease: Secondary | ICD-10-CM | POA: Diagnosis not present

## 2017-02-07 DIAGNOSIS — F0150 Vascular dementia without behavioral disturbance: Secondary | ICD-10-CM | POA: Diagnosis not present

## 2017-02-07 DIAGNOSIS — E46 Unspecified protein-calorie malnutrition: Secondary | ICD-10-CM | POA: Diagnosis not present

## 2017-02-12 DIAGNOSIS — E46 Unspecified protein-calorie malnutrition: Secondary | ICD-10-CM | POA: Diagnosis not present

## 2017-02-12 DIAGNOSIS — M0680 Other specified rheumatoid arthritis, unspecified site: Secondary | ICD-10-CM | POA: Diagnosis not present

## 2017-02-12 DIAGNOSIS — E118 Type 2 diabetes mellitus with unspecified complications: Secondary | ICD-10-CM | POA: Diagnosis not present

## 2017-02-12 DIAGNOSIS — G4089 Other seizures: Secondary | ICD-10-CM | POA: Diagnosis not present

## 2017-02-12 DIAGNOSIS — F0150 Vascular dementia without behavioral disturbance: Secondary | ICD-10-CM | POA: Diagnosis not present

## 2017-02-12 DIAGNOSIS — I6789 Other cerebrovascular disease: Secondary | ICD-10-CM | POA: Diagnosis not present

## 2017-02-13 ENCOUNTER — Ambulatory Visit (INDEPENDENT_AMBULATORY_CARE_PROVIDER_SITE_OTHER): Payer: Medicare Other | Admitting: *Deleted

## 2017-02-13 DIAGNOSIS — E46 Unspecified protein-calorie malnutrition: Secondary | ICD-10-CM | POA: Diagnosis not present

## 2017-02-13 DIAGNOSIS — E118 Type 2 diabetes mellitus with unspecified complications: Secondary | ICD-10-CM | POA: Diagnosis not present

## 2017-02-13 DIAGNOSIS — R55 Syncope and collapse: Secondary | ICD-10-CM

## 2017-02-13 DIAGNOSIS — M0680 Other specified rheumatoid arthritis, unspecified site: Secondary | ICD-10-CM | POA: Diagnosis not present

## 2017-02-13 DIAGNOSIS — I6789 Other cerebrovascular disease: Secondary | ICD-10-CM | POA: Diagnosis not present

## 2017-02-13 DIAGNOSIS — G4089 Other seizures: Secondary | ICD-10-CM | POA: Diagnosis not present

## 2017-02-13 DIAGNOSIS — F0150 Vascular dementia without behavioral disturbance: Secondary | ICD-10-CM | POA: Diagnosis not present

## 2017-02-13 NOTE — Progress Notes (Signed)
[facility] Summary Report / Loop Recorder 

## 2017-02-14 ENCOUNTER — Telehealth: Payer: Self-pay | Admitting: Cardiology

## 2017-02-14 NOTE — Telephone Encounter (Signed)
Spoke w/ pt granddaughter and requested a manual transmission w/ pt home monitor b/c her home monitor has not updated in at least 14 days.

## 2017-02-16 DIAGNOSIS — M0680 Other specified rheumatoid arthritis, unspecified site: Secondary | ICD-10-CM | POA: Diagnosis not present

## 2017-02-16 DIAGNOSIS — E46 Unspecified protein-calorie malnutrition: Secondary | ICD-10-CM | POA: Diagnosis not present

## 2017-02-16 DIAGNOSIS — F0150 Vascular dementia without behavioral disturbance: Secondary | ICD-10-CM | POA: Diagnosis not present

## 2017-02-16 DIAGNOSIS — E118 Type 2 diabetes mellitus with unspecified complications: Secondary | ICD-10-CM | POA: Diagnosis not present

## 2017-02-16 DIAGNOSIS — I6789 Other cerebrovascular disease: Secondary | ICD-10-CM | POA: Diagnosis not present

## 2017-02-16 DIAGNOSIS — G4089 Other seizures: Secondary | ICD-10-CM | POA: Diagnosis not present

## 2017-02-19 DIAGNOSIS — M0680 Other specified rheumatoid arthritis, unspecified site: Secondary | ICD-10-CM | POA: Diagnosis not present

## 2017-02-19 DIAGNOSIS — I6789 Other cerebrovascular disease: Secondary | ICD-10-CM | POA: Diagnosis not present

## 2017-02-19 DIAGNOSIS — E118 Type 2 diabetes mellitus with unspecified complications: Secondary | ICD-10-CM | POA: Diagnosis not present

## 2017-02-19 DIAGNOSIS — F0150 Vascular dementia without behavioral disturbance: Secondary | ICD-10-CM | POA: Diagnosis not present

## 2017-02-19 DIAGNOSIS — E46 Unspecified protein-calorie malnutrition: Secondary | ICD-10-CM | POA: Diagnosis not present

## 2017-02-19 DIAGNOSIS — G4089 Other seizures: Secondary | ICD-10-CM | POA: Diagnosis not present

## 2017-02-21 DIAGNOSIS — M0680 Other specified rheumatoid arthritis, unspecified site: Secondary | ICD-10-CM | POA: Diagnosis not present

## 2017-02-21 DIAGNOSIS — I6789 Other cerebrovascular disease: Secondary | ICD-10-CM | POA: Diagnosis not present

## 2017-02-21 DIAGNOSIS — E46 Unspecified protein-calorie malnutrition: Secondary | ICD-10-CM | POA: Diagnosis not present

## 2017-02-21 DIAGNOSIS — G4089 Other seizures: Secondary | ICD-10-CM | POA: Diagnosis not present

## 2017-02-21 DIAGNOSIS — F0150 Vascular dementia without behavioral disturbance: Secondary | ICD-10-CM | POA: Diagnosis not present

## 2017-02-21 DIAGNOSIS — E118 Type 2 diabetes mellitus with unspecified complications: Secondary | ICD-10-CM | POA: Diagnosis not present

## 2017-02-23 ENCOUNTER — Encounter: Payer: Self-pay | Admitting: Cardiology

## 2017-02-23 DIAGNOSIS — F0150 Vascular dementia without behavioral disturbance: Secondary | ICD-10-CM | POA: Diagnosis not present

## 2017-02-23 DIAGNOSIS — M0680 Other specified rheumatoid arthritis, unspecified site: Secondary | ICD-10-CM | POA: Diagnosis not present

## 2017-02-23 DIAGNOSIS — I6789 Other cerebrovascular disease: Secondary | ICD-10-CM | POA: Diagnosis not present

## 2017-02-23 DIAGNOSIS — E46 Unspecified protein-calorie malnutrition: Secondary | ICD-10-CM | POA: Diagnosis not present

## 2017-02-23 DIAGNOSIS — E118 Type 2 diabetes mellitus with unspecified complications: Secondary | ICD-10-CM | POA: Diagnosis not present

## 2017-02-23 DIAGNOSIS — G4089 Other seizures: Secondary | ICD-10-CM | POA: Diagnosis not present

## 2017-02-26 DIAGNOSIS — E118 Type 2 diabetes mellitus with unspecified complications: Secondary | ICD-10-CM | POA: Diagnosis not present

## 2017-02-26 DIAGNOSIS — F0150 Vascular dementia without behavioral disturbance: Secondary | ICD-10-CM | POA: Diagnosis not present

## 2017-02-26 DIAGNOSIS — E46 Unspecified protein-calorie malnutrition: Secondary | ICD-10-CM | POA: Diagnosis not present

## 2017-02-26 DIAGNOSIS — G4089 Other seizures: Secondary | ICD-10-CM | POA: Diagnosis not present

## 2017-02-26 DIAGNOSIS — I6789 Other cerebrovascular disease: Secondary | ICD-10-CM | POA: Diagnosis not present

## 2017-02-26 DIAGNOSIS — M0680 Other specified rheumatoid arthritis, unspecified site: Secondary | ICD-10-CM | POA: Diagnosis not present

## 2017-02-26 LAB — CUP PACEART REMOTE DEVICE CHECK
Implantable Pulse Generator Implant Date: 20150326
MDC IDC SESS DTM: 20180717155555

## 2017-02-26 NOTE — Progress Notes (Signed)
Carelink summary report received. Battery status OK. Normal device function. No new symptom episodes, tachy episodes, brady, or pause episodes. 70 AF 23.6% No ECGs available. +Eliquis. Monthly summary reports and ROV/PRN

## 2017-02-28 DIAGNOSIS — R531 Weakness: Secondary | ICD-10-CM | POA: Diagnosis not present

## 2017-02-28 DIAGNOSIS — M0680 Other specified rheumatoid arthritis, unspecified site: Secondary | ICD-10-CM | POA: Diagnosis not present

## 2017-02-28 DIAGNOSIS — I6789 Other cerebrovascular disease: Secondary | ICD-10-CM | POA: Diagnosis not present

## 2017-02-28 DIAGNOSIS — E785 Hyperlipidemia, unspecified: Secondary | ICD-10-CM | POA: Diagnosis not present

## 2017-02-28 DIAGNOSIS — R1310 Dysphagia, unspecified: Secondary | ICD-10-CM | POA: Diagnosis not present

## 2017-02-28 DIAGNOSIS — G4089 Other seizures: Secondary | ICD-10-CM | POA: Diagnosis not present

## 2017-02-28 DIAGNOSIS — I509 Heart failure, unspecified: Secondary | ICD-10-CM | POA: Diagnosis not present

## 2017-02-28 DIAGNOSIS — I519 Heart disease, unspecified: Secondary | ICD-10-CM | POA: Diagnosis not present

## 2017-02-28 DIAGNOSIS — E46 Unspecified protein-calorie malnutrition: Secondary | ICD-10-CM | POA: Diagnosis not present

## 2017-02-28 DIAGNOSIS — I739 Peripheral vascular disease, unspecified: Secondary | ICD-10-CM | POA: Diagnosis not present

## 2017-02-28 DIAGNOSIS — F0150 Vascular dementia without behavioral disturbance: Secondary | ICD-10-CM | POA: Diagnosis not present

## 2017-02-28 DIAGNOSIS — I10 Essential (primary) hypertension: Secondary | ICD-10-CM | POA: Diagnosis not present

## 2017-02-28 DIAGNOSIS — E118 Type 2 diabetes mellitus with unspecified complications: Secondary | ICD-10-CM | POA: Diagnosis not present

## 2017-03-01 ENCOUNTER — Other Ambulatory Visit: Payer: Self-pay | Admitting: Family Medicine

## 2017-03-05 DIAGNOSIS — F0150 Vascular dementia without behavioral disturbance: Secondary | ICD-10-CM | POA: Diagnosis not present

## 2017-03-05 DIAGNOSIS — E46 Unspecified protein-calorie malnutrition: Secondary | ICD-10-CM | POA: Diagnosis not present

## 2017-03-05 DIAGNOSIS — E118 Type 2 diabetes mellitus with unspecified complications: Secondary | ICD-10-CM | POA: Diagnosis not present

## 2017-03-05 DIAGNOSIS — G4089 Other seizures: Secondary | ICD-10-CM | POA: Diagnosis not present

## 2017-03-05 DIAGNOSIS — M0680 Other specified rheumatoid arthritis, unspecified site: Secondary | ICD-10-CM | POA: Diagnosis not present

## 2017-03-05 DIAGNOSIS — I6789 Other cerebrovascular disease: Secondary | ICD-10-CM | POA: Diagnosis not present

## 2017-03-07 ENCOUNTER — Encounter: Payer: Self-pay | Admitting: Cardiology

## 2017-03-07 DIAGNOSIS — E118 Type 2 diabetes mellitus with unspecified complications: Secondary | ICD-10-CM | POA: Diagnosis not present

## 2017-03-07 DIAGNOSIS — M0680 Other specified rheumatoid arthritis, unspecified site: Secondary | ICD-10-CM | POA: Diagnosis not present

## 2017-03-07 DIAGNOSIS — F0150 Vascular dementia without behavioral disturbance: Secondary | ICD-10-CM | POA: Diagnosis not present

## 2017-03-07 DIAGNOSIS — E46 Unspecified protein-calorie malnutrition: Secondary | ICD-10-CM | POA: Diagnosis not present

## 2017-03-07 DIAGNOSIS — I6789 Other cerebrovascular disease: Secondary | ICD-10-CM | POA: Diagnosis not present

## 2017-03-07 DIAGNOSIS — G4089 Other seizures: Secondary | ICD-10-CM | POA: Diagnosis not present

## 2017-03-09 DIAGNOSIS — E46 Unspecified protein-calorie malnutrition: Secondary | ICD-10-CM | POA: Diagnosis not present

## 2017-03-09 DIAGNOSIS — F0150 Vascular dementia without behavioral disturbance: Secondary | ICD-10-CM | POA: Diagnosis not present

## 2017-03-09 DIAGNOSIS — I6789 Other cerebrovascular disease: Secondary | ICD-10-CM | POA: Diagnosis not present

## 2017-03-09 DIAGNOSIS — E118 Type 2 diabetes mellitus with unspecified complications: Secondary | ICD-10-CM | POA: Diagnosis not present

## 2017-03-09 DIAGNOSIS — M0680 Other specified rheumatoid arthritis, unspecified site: Secondary | ICD-10-CM | POA: Diagnosis not present

## 2017-03-09 DIAGNOSIS — G4089 Other seizures: Secondary | ICD-10-CM | POA: Diagnosis not present

## 2017-03-12 DIAGNOSIS — M0680 Other specified rheumatoid arthritis, unspecified site: Secondary | ICD-10-CM | POA: Diagnosis not present

## 2017-03-12 DIAGNOSIS — G4089 Other seizures: Secondary | ICD-10-CM | POA: Diagnosis not present

## 2017-03-12 DIAGNOSIS — E118 Type 2 diabetes mellitus with unspecified complications: Secondary | ICD-10-CM | POA: Diagnosis not present

## 2017-03-12 DIAGNOSIS — E46 Unspecified protein-calorie malnutrition: Secondary | ICD-10-CM | POA: Diagnosis not present

## 2017-03-12 DIAGNOSIS — I6789 Other cerebrovascular disease: Secondary | ICD-10-CM | POA: Diagnosis not present

## 2017-03-12 DIAGNOSIS — F0150 Vascular dementia without behavioral disturbance: Secondary | ICD-10-CM | POA: Diagnosis not present

## 2017-03-13 DIAGNOSIS — G4089 Other seizures: Secondary | ICD-10-CM | POA: Diagnosis not present

## 2017-03-13 DIAGNOSIS — E46 Unspecified protein-calorie malnutrition: Secondary | ICD-10-CM | POA: Diagnosis not present

## 2017-03-13 DIAGNOSIS — I6789 Other cerebrovascular disease: Secondary | ICD-10-CM | POA: Diagnosis not present

## 2017-03-13 DIAGNOSIS — F0150 Vascular dementia without behavioral disturbance: Secondary | ICD-10-CM | POA: Diagnosis not present

## 2017-03-13 DIAGNOSIS — E118 Type 2 diabetes mellitus with unspecified complications: Secondary | ICD-10-CM | POA: Diagnosis not present

## 2017-03-13 DIAGNOSIS — M0680 Other specified rheumatoid arthritis, unspecified site: Secondary | ICD-10-CM | POA: Diagnosis not present

## 2017-03-14 ENCOUNTER — Other Ambulatory Visit: Payer: Self-pay | Admitting: Internal Medicine

## 2017-03-15 ENCOUNTER — Encounter: Payer: Medicare Other | Admitting: *Deleted

## 2017-03-15 DIAGNOSIS — I6789 Other cerebrovascular disease: Secondary | ICD-10-CM | POA: Diagnosis not present

## 2017-03-15 DIAGNOSIS — F0150 Vascular dementia without behavioral disturbance: Secondary | ICD-10-CM | POA: Diagnosis not present

## 2017-03-15 DIAGNOSIS — M0680 Other specified rheumatoid arthritis, unspecified site: Secondary | ICD-10-CM | POA: Diagnosis not present

## 2017-03-15 DIAGNOSIS — E118 Type 2 diabetes mellitus with unspecified complications: Secondary | ICD-10-CM | POA: Diagnosis not present

## 2017-03-15 DIAGNOSIS — E46 Unspecified protein-calorie malnutrition: Secondary | ICD-10-CM | POA: Diagnosis not present

## 2017-03-15 DIAGNOSIS — G4089 Other seizures: Secondary | ICD-10-CM | POA: Diagnosis not present

## 2017-03-15 NOTE — Telephone Encounter (Signed)
Opened in error

## 2017-03-19 DIAGNOSIS — E46 Unspecified protein-calorie malnutrition: Secondary | ICD-10-CM | POA: Diagnosis not present

## 2017-03-19 DIAGNOSIS — E118 Type 2 diabetes mellitus with unspecified complications: Secondary | ICD-10-CM | POA: Diagnosis not present

## 2017-03-19 DIAGNOSIS — M0680 Other specified rheumatoid arthritis, unspecified site: Secondary | ICD-10-CM | POA: Diagnosis not present

## 2017-03-19 DIAGNOSIS — F0150 Vascular dementia without behavioral disturbance: Secondary | ICD-10-CM | POA: Diagnosis not present

## 2017-03-19 DIAGNOSIS — I6789 Other cerebrovascular disease: Secondary | ICD-10-CM | POA: Diagnosis not present

## 2017-03-19 DIAGNOSIS — G4089 Other seizures: Secondary | ICD-10-CM | POA: Diagnosis not present

## 2017-03-22 ENCOUNTER — Other Ambulatory Visit: Payer: Self-pay | Admitting: Neurology

## 2017-03-22 DIAGNOSIS — E118 Type 2 diabetes mellitus with unspecified complications: Secondary | ICD-10-CM | POA: Diagnosis not present

## 2017-03-22 DIAGNOSIS — I6789 Other cerebrovascular disease: Secondary | ICD-10-CM | POA: Diagnosis not present

## 2017-03-22 DIAGNOSIS — E46 Unspecified protein-calorie malnutrition: Secondary | ICD-10-CM | POA: Diagnosis not present

## 2017-03-22 DIAGNOSIS — M0680 Other specified rheumatoid arthritis, unspecified site: Secondary | ICD-10-CM | POA: Diagnosis not present

## 2017-03-22 DIAGNOSIS — F0150 Vascular dementia without behavioral disturbance: Secondary | ICD-10-CM | POA: Diagnosis not present

## 2017-03-22 DIAGNOSIS — G4089 Other seizures: Secondary | ICD-10-CM | POA: Diagnosis not present

## 2017-03-26 DIAGNOSIS — M0680 Other specified rheumatoid arthritis, unspecified site: Secondary | ICD-10-CM | POA: Diagnosis not present

## 2017-03-26 DIAGNOSIS — G4089 Other seizures: Secondary | ICD-10-CM | POA: Diagnosis not present

## 2017-03-26 DIAGNOSIS — E118 Type 2 diabetes mellitus with unspecified complications: Secondary | ICD-10-CM | POA: Diagnosis not present

## 2017-03-26 DIAGNOSIS — I6789 Other cerebrovascular disease: Secondary | ICD-10-CM | POA: Diagnosis not present

## 2017-03-26 DIAGNOSIS — F0150 Vascular dementia without behavioral disturbance: Secondary | ICD-10-CM | POA: Diagnosis not present

## 2017-03-26 DIAGNOSIS — E46 Unspecified protein-calorie malnutrition: Secondary | ICD-10-CM | POA: Diagnosis not present

## 2017-03-28 DIAGNOSIS — I6789 Other cerebrovascular disease: Secondary | ICD-10-CM | POA: Diagnosis not present

## 2017-03-28 DIAGNOSIS — E46 Unspecified protein-calorie malnutrition: Secondary | ICD-10-CM | POA: Diagnosis not present

## 2017-03-28 DIAGNOSIS — G4089 Other seizures: Secondary | ICD-10-CM | POA: Diagnosis not present

## 2017-03-28 DIAGNOSIS — F0150 Vascular dementia without behavioral disturbance: Secondary | ICD-10-CM | POA: Diagnosis not present

## 2017-03-28 DIAGNOSIS — M0680 Other specified rheumatoid arthritis, unspecified site: Secondary | ICD-10-CM | POA: Diagnosis not present

## 2017-03-28 DIAGNOSIS — E118 Type 2 diabetes mellitus with unspecified complications: Secondary | ICD-10-CM | POA: Diagnosis not present

## 2017-03-29 DIAGNOSIS — E118 Type 2 diabetes mellitus with unspecified complications: Secondary | ICD-10-CM | POA: Diagnosis not present

## 2017-03-29 DIAGNOSIS — F0150 Vascular dementia without behavioral disturbance: Secondary | ICD-10-CM | POA: Diagnosis not present

## 2017-03-29 DIAGNOSIS — E46 Unspecified protein-calorie malnutrition: Secondary | ICD-10-CM | POA: Diagnosis not present

## 2017-03-29 DIAGNOSIS — I6789 Other cerebrovascular disease: Secondary | ICD-10-CM | POA: Diagnosis not present

## 2017-03-29 DIAGNOSIS — G4089 Other seizures: Secondary | ICD-10-CM | POA: Diagnosis not present

## 2017-03-29 DIAGNOSIS — M0680 Other specified rheumatoid arthritis, unspecified site: Secondary | ICD-10-CM | POA: Diagnosis not present

## 2017-03-31 DIAGNOSIS — G4089 Other seizures: Secondary | ICD-10-CM | POA: Diagnosis not present

## 2017-03-31 DIAGNOSIS — M0680 Other specified rheumatoid arthritis, unspecified site: Secondary | ICD-10-CM | POA: Diagnosis not present

## 2017-03-31 DIAGNOSIS — F0150 Vascular dementia without behavioral disturbance: Secondary | ICD-10-CM | POA: Diagnosis not present

## 2017-03-31 DIAGNOSIS — R1310 Dysphagia, unspecified: Secondary | ICD-10-CM | POA: Diagnosis not present

## 2017-03-31 DIAGNOSIS — E118 Type 2 diabetes mellitus with unspecified complications: Secondary | ICD-10-CM | POA: Diagnosis not present

## 2017-03-31 DIAGNOSIS — I10 Essential (primary) hypertension: Secondary | ICD-10-CM | POA: Diagnosis not present

## 2017-03-31 DIAGNOSIS — I519 Heart disease, unspecified: Secondary | ICD-10-CM | POA: Diagnosis not present

## 2017-03-31 DIAGNOSIS — I6789 Other cerebrovascular disease: Secondary | ICD-10-CM | POA: Diagnosis not present

## 2017-03-31 DIAGNOSIS — I509 Heart failure, unspecified: Secondary | ICD-10-CM | POA: Diagnosis not present

## 2017-03-31 DIAGNOSIS — E46 Unspecified protein-calorie malnutrition: Secondary | ICD-10-CM | POA: Diagnosis not present

## 2017-03-31 DIAGNOSIS — R531 Weakness: Secondary | ICD-10-CM | POA: Diagnosis not present

## 2017-03-31 DIAGNOSIS — I739 Peripheral vascular disease, unspecified: Secondary | ICD-10-CM | POA: Diagnosis not present

## 2017-03-31 DIAGNOSIS — E785 Hyperlipidemia, unspecified: Secondary | ICD-10-CM | POA: Diagnosis not present

## 2017-04-03 DIAGNOSIS — E46 Unspecified protein-calorie malnutrition: Secondary | ICD-10-CM | POA: Diagnosis not present

## 2017-04-03 DIAGNOSIS — E118 Type 2 diabetes mellitus with unspecified complications: Secondary | ICD-10-CM | POA: Diagnosis not present

## 2017-04-03 DIAGNOSIS — M0680 Other specified rheumatoid arthritis, unspecified site: Secondary | ICD-10-CM | POA: Diagnosis not present

## 2017-04-03 DIAGNOSIS — F0150 Vascular dementia without behavioral disturbance: Secondary | ICD-10-CM | POA: Diagnosis not present

## 2017-04-03 DIAGNOSIS — G4089 Other seizures: Secondary | ICD-10-CM | POA: Diagnosis not present

## 2017-04-03 DIAGNOSIS — I6789 Other cerebrovascular disease: Secondary | ICD-10-CM | POA: Diagnosis not present

## 2017-04-04 DIAGNOSIS — I6789 Other cerebrovascular disease: Secondary | ICD-10-CM | POA: Diagnosis not present

## 2017-04-04 DIAGNOSIS — E118 Type 2 diabetes mellitus with unspecified complications: Secondary | ICD-10-CM | POA: Diagnosis not present

## 2017-04-04 DIAGNOSIS — F0150 Vascular dementia without behavioral disturbance: Secondary | ICD-10-CM | POA: Diagnosis not present

## 2017-04-04 DIAGNOSIS — G4089 Other seizures: Secondary | ICD-10-CM | POA: Diagnosis not present

## 2017-04-04 DIAGNOSIS — M0680 Other specified rheumatoid arthritis, unspecified site: Secondary | ICD-10-CM | POA: Diagnosis not present

## 2017-04-04 DIAGNOSIS — E46 Unspecified protein-calorie malnutrition: Secondary | ICD-10-CM | POA: Diagnosis not present

## 2017-04-05 ENCOUNTER — Encounter: Payer: Medicare Other | Admitting: *Deleted

## 2017-04-05 DIAGNOSIS — E46 Unspecified protein-calorie malnutrition: Secondary | ICD-10-CM | POA: Diagnosis not present

## 2017-04-05 DIAGNOSIS — I6789 Other cerebrovascular disease: Secondary | ICD-10-CM | POA: Diagnosis not present

## 2017-04-05 DIAGNOSIS — F0150 Vascular dementia without behavioral disturbance: Secondary | ICD-10-CM | POA: Diagnosis not present

## 2017-04-05 DIAGNOSIS — E118 Type 2 diabetes mellitus with unspecified complications: Secondary | ICD-10-CM | POA: Diagnosis not present

## 2017-04-05 DIAGNOSIS — M0680 Other specified rheumatoid arthritis, unspecified site: Secondary | ICD-10-CM | POA: Diagnosis not present

## 2017-04-05 DIAGNOSIS — G4089 Other seizures: Secondary | ICD-10-CM | POA: Diagnosis not present

## 2017-04-07 ENCOUNTER — Other Ambulatory Visit: Payer: Self-pay | Admitting: Neurology

## 2017-04-09 DIAGNOSIS — G4089 Other seizures: Secondary | ICD-10-CM | POA: Diagnosis not present

## 2017-04-09 DIAGNOSIS — E118 Type 2 diabetes mellitus with unspecified complications: Secondary | ICD-10-CM | POA: Diagnosis not present

## 2017-04-09 DIAGNOSIS — F0150 Vascular dementia without behavioral disturbance: Secondary | ICD-10-CM | POA: Diagnosis not present

## 2017-04-09 DIAGNOSIS — I6789 Other cerebrovascular disease: Secondary | ICD-10-CM | POA: Diagnosis not present

## 2017-04-09 DIAGNOSIS — E46 Unspecified protein-calorie malnutrition: Secondary | ICD-10-CM | POA: Diagnosis not present

## 2017-04-09 DIAGNOSIS — M0680 Other specified rheumatoid arthritis, unspecified site: Secondary | ICD-10-CM | POA: Diagnosis not present

## 2017-04-10 DIAGNOSIS — F0150 Vascular dementia without behavioral disturbance: Secondary | ICD-10-CM | POA: Diagnosis not present

## 2017-04-10 DIAGNOSIS — G4089 Other seizures: Secondary | ICD-10-CM | POA: Diagnosis not present

## 2017-04-10 DIAGNOSIS — I6789 Other cerebrovascular disease: Secondary | ICD-10-CM | POA: Diagnosis not present

## 2017-04-10 DIAGNOSIS — M0680 Other specified rheumatoid arthritis, unspecified site: Secondary | ICD-10-CM | POA: Diagnosis not present

## 2017-04-10 DIAGNOSIS — E46 Unspecified protein-calorie malnutrition: Secondary | ICD-10-CM | POA: Diagnosis not present

## 2017-04-10 DIAGNOSIS — E118 Type 2 diabetes mellitus with unspecified complications: Secondary | ICD-10-CM | POA: Diagnosis not present

## 2017-04-11 ENCOUNTER — Telehealth: Payer: Self-pay | Admitting: *Deleted

## 2017-04-11 NOTE — Telephone Encounter (Signed)
Faxed signed POC back to [facility] at [phone]. Received confirmation.

## 2017-04-12 DIAGNOSIS — E118 Type 2 diabetes mellitus with unspecified complications: Secondary | ICD-10-CM | POA: Diagnosis not present

## 2017-04-12 DIAGNOSIS — I6789 Other cerebrovascular disease: Secondary | ICD-10-CM | POA: Diagnosis not present

## 2017-04-12 DIAGNOSIS — E46 Unspecified protein-calorie malnutrition: Secondary | ICD-10-CM | POA: Diagnosis not present

## 2017-04-12 DIAGNOSIS — G4089 Other seizures: Secondary | ICD-10-CM | POA: Diagnosis not present

## 2017-04-12 DIAGNOSIS — F0150 Vascular dementia without behavioral disturbance: Secondary | ICD-10-CM | POA: Diagnosis not present

## 2017-04-12 DIAGNOSIS — M0680 Other specified rheumatoid arthritis, unspecified site: Secondary | ICD-10-CM | POA: Diagnosis not present

## 2017-04-13 DIAGNOSIS — M0680 Other specified rheumatoid arthritis, unspecified site: Secondary | ICD-10-CM | POA: Diagnosis not present

## 2017-04-13 DIAGNOSIS — I6789 Other cerebrovascular disease: Secondary | ICD-10-CM | POA: Diagnosis not present

## 2017-04-13 DIAGNOSIS — E118 Type 2 diabetes mellitus with unspecified complications: Secondary | ICD-10-CM | POA: Diagnosis not present

## 2017-04-13 DIAGNOSIS — G4089 Other seizures: Secondary | ICD-10-CM | POA: Diagnosis not present

## 2017-04-13 DIAGNOSIS — E46 Unspecified protein-calorie malnutrition: Secondary | ICD-10-CM | POA: Diagnosis not present

## 2017-04-13 DIAGNOSIS — F0150 Vascular dementia without behavioral disturbance: Secondary | ICD-10-CM | POA: Diagnosis not present

## 2017-04-16 DIAGNOSIS — E118 Type 2 diabetes mellitus with unspecified complications: Secondary | ICD-10-CM | POA: Diagnosis not present

## 2017-04-16 DIAGNOSIS — I6789 Other cerebrovascular disease: Secondary | ICD-10-CM | POA: Diagnosis not present

## 2017-04-16 DIAGNOSIS — M0680 Other specified rheumatoid arthritis, unspecified site: Secondary | ICD-10-CM | POA: Diagnosis not present

## 2017-04-16 DIAGNOSIS — F0150 Vascular dementia without behavioral disturbance: Secondary | ICD-10-CM | POA: Diagnosis not present

## 2017-04-16 DIAGNOSIS — G4089 Other seizures: Secondary | ICD-10-CM | POA: Diagnosis not present

## 2017-04-16 DIAGNOSIS — E46 Unspecified protein-calorie malnutrition: Secondary | ICD-10-CM | POA: Diagnosis not present

## 2017-04-18 DIAGNOSIS — M0680 Other specified rheumatoid arthritis, unspecified site: Secondary | ICD-10-CM | POA: Diagnosis not present

## 2017-04-18 DIAGNOSIS — I6789 Other cerebrovascular disease: Secondary | ICD-10-CM | POA: Diagnosis not present

## 2017-04-18 DIAGNOSIS — G4089 Other seizures: Secondary | ICD-10-CM | POA: Diagnosis not present

## 2017-04-18 DIAGNOSIS — E118 Type 2 diabetes mellitus with unspecified complications: Secondary | ICD-10-CM | POA: Diagnosis not present

## 2017-04-18 DIAGNOSIS — F0150 Vascular dementia without behavioral disturbance: Secondary | ICD-10-CM | POA: Diagnosis not present

## 2017-04-18 DIAGNOSIS — E46 Unspecified protein-calorie malnutrition: Secondary | ICD-10-CM | POA: Diagnosis not present

## 2017-04-19 ENCOUNTER — Telehealth: Payer: Self-pay

## 2017-04-19 DIAGNOSIS — E46 Unspecified protein-calorie malnutrition: Secondary | ICD-10-CM | POA: Diagnosis not present

## 2017-04-19 DIAGNOSIS — E118 Type 2 diabetes mellitus with unspecified complications: Secondary | ICD-10-CM | POA: Diagnosis not present

## 2017-04-19 DIAGNOSIS — M0680 Other specified rheumatoid arthritis, unspecified site: Secondary | ICD-10-CM | POA: Diagnosis not present

## 2017-04-19 DIAGNOSIS — G4089 Other seizures: Secondary | ICD-10-CM | POA: Diagnosis not present

## 2017-04-19 DIAGNOSIS — I6789 Other cerebrovascular disease: Secondary | ICD-10-CM | POA: Diagnosis not present

## 2017-04-19 DIAGNOSIS — F0150 Vascular dementia without behavioral disturbance: Secondary | ICD-10-CM | POA: Diagnosis not present

## 2017-04-19 NOTE — Telephone Encounter (Signed)
Rn call [facility] and spoke with April about refill request for oxycodone. Rn stated the pt is in hospice care, and they have standing orders. Rn stated the medical Md will be seeing the patient in 04/2017 for home visit. Rn stated there has been a request for hospice medical team to take over ongoing orders, medications when pt is seen again in 04/2017. April verbalized understanding that Dr. Jaynee Eagles will remain the Md until 04/2017 when hospice MD see pt again.

## 2017-04-19 NOTE — Telephone Encounter (Signed)
Rn receive call from [place] coordinator about orders from Dr. Jaynee Eagles. [facility] stated they are very small hospice, and they have a part time Market researcher. The pt has been in hospice since 08/2016,and is being seen by the MD every two months. Rn stated Dr. Jaynee Eagles would like to know if MD at hospice can take over the standing orders, and meds.  Rn stated because he is assessing the patient, and evaluating at home. Rn stated Dr.Ahern does not do home visits.Rn stated pt was last seen by Dr. Jaynee Eagles in 11/2015. Pt has not upcoming appts. [facility] stated the  MD is schedule to see patient in 04/2017.The pt is bed bound and receiving hospice at home. She will let the MD know of the request. Rn stated also there was a refill request for oxycodone 10mg /ml suspen. [facility] RN stated they have standing orders for all pain medicine, and should not need a refill. Rn stated a call will be made to the pharmacy.

## 2017-04-19 NOTE — Telephone Encounter (Signed)
Rn call [facility] at [phone]. Rn request to speak with [place] Callas but she was not in today. Rn left message for Joanne Chars to call back to discuss refill for oxycodone 10mg /ml suspens, and form form to be sign. The form is for family to have medical social worker services continue. Pt was last seen 11/2015 and is bed bound. RN stated the referral was done by Dr. Jaynee Eagles initially. Rn stated usually if our office does referrals hospice medical team takes over all medication management. Rn stated the pt is bed bound per family. Rn stated per Dr.Ahern she would like the md, np who is seeing the pt at home can do all the orders. Rn requested a call back.

## 2017-04-19 NOTE — Telephone Encounter (Signed)
Social worker form sign by Dr Bing Quarry fax to continue services to hospice. The MD at hospice will being seeing pt in 04/2017 and will be ask to take over standing orders,and medications.

## 2017-04-23 DIAGNOSIS — E118 Type 2 diabetes mellitus with unspecified complications: Secondary | ICD-10-CM | POA: Diagnosis not present

## 2017-04-23 DIAGNOSIS — M0680 Other specified rheumatoid arthritis, unspecified site: Secondary | ICD-10-CM | POA: Diagnosis not present

## 2017-04-23 DIAGNOSIS — E46 Unspecified protein-calorie malnutrition: Secondary | ICD-10-CM | POA: Diagnosis not present

## 2017-04-23 DIAGNOSIS — G4089 Other seizures: Secondary | ICD-10-CM | POA: Diagnosis not present

## 2017-04-23 DIAGNOSIS — I6789 Other cerebrovascular disease: Secondary | ICD-10-CM | POA: Diagnosis not present

## 2017-04-23 DIAGNOSIS — F0150 Vascular dementia without behavioral disturbance: Secondary | ICD-10-CM | POA: Diagnosis not present

## 2017-04-25 DIAGNOSIS — G4089 Other seizures: Secondary | ICD-10-CM | POA: Diagnosis not present

## 2017-04-25 DIAGNOSIS — E46 Unspecified protein-calorie malnutrition: Secondary | ICD-10-CM | POA: Diagnosis not present

## 2017-04-25 DIAGNOSIS — I6789 Other cerebrovascular disease: Secondary | ICD-10-CM | POA: Diagnosis not present

## 2017-04-25 DIAGNOSIS — E118 Type 2 diabetes mellitus with unspecified complications: Secondary | ICD-10-CM | POA: Diagnosis not present

## 2017-04-25 DIAGNOSIS — F0150 Vascular dementia without behavioral disturbance: Secondary | ICD-10-CM | POA: Diagnosis not present

## 2017-04-25 DIAGNOSIS — M0680 Other specified rheumatoid arthritis, unspecified site: Secondary | ICD-10-CM | POA: Diagnosis not present

## 2017-04-26 DIAGNOSIS — M0680 Other specified rheumatoid arthritis, unspecified site: Secondary | ICD-10-CM | POA: Diagnosis not present

## 2017-04-26 DIAGNOSIS — E118 Type 2 diabetes mellitus with unspecified complications: Secondary | ICD-10-CM | POA: Diagnosis not present

## 2017-04-26 DIAGNOSIS — F0150 Vascular dementia without behavioral disturbance: Secondary | ICD-10-CM | POA: Diagnosis not present

## 2017-04-26 DIAGNOSIS — I6789 Other cerebrovascular disease: Secondary | ICD-10-CM | POA: Diagnosis not present

## 2017-04-26 DIAGNOSIS — G4089 Other seizures: Secondary | ICD-10-CM | POA: Diagnosis not present

## 2017-04-26 DIAGNOSIS — E46 Unspecified protein-calorie malnutrition: Secondary | ICD-10-CM | POA: Diagnosis not present

## 2017-04-28 ENCOUNTER — Other Ambulatory Visit: Payer: Self-pay | Admitting: Family Medicine

## 2017-04-30 DIAGNOSIS — E46 Unspecified protein-calorie malnutrition: Secondary | ICD-10-CM | POA: Diagnosis not present

## 2017-04-30 DIAGNOSIS — E785 Hyperlipidemia, unspecified: Secondary | ICD-10-CM | POA: Diagnosis not present

## 2017-04-30 DIAGNOSIS — M0680 Other specified rheumatoid arthritis, unspecified site: Secondary | ICD-10-CM | POA: Diagnosis not present

## 2017-04-30 DIAGNOSIS — E118 Type 2 diabetes mellitus with unspecified complications: Secondary | ICD-10-CM | POA: Diagnosis not present

## 2017-04-30 DIAGNOSIS — I519 Heart disease, unspecified: Secondary | ICD-10-CM | POA: Diagnosis not present

## 2017-04-30 DIAGNOSIS — I10 Essential (primary) hypertension: Secondary | ICD-10-CM | POA: Diagnosis not present

## 2017-04-30 DIAGNOSIS — F0150 Vascular dementia without behavioral disturbance: Secondary | ICD-10-CM | POA: Diagnosis not present

## 2017-04-30 DIAGNOSIS — R531 Weakness: Secondary | ICD-10-CM | POA: Diagnosis not present

## 2017-04-30 DIAGNOSIS — R1310 Dysphagia, unspecified: Secondary | ICD-10-CM | POA: Diagnosis not present

## 2017-04-30 DIAGNOSIS — I6789 Other cerebrovascular disease: Secondary | ICD-10-CM | POA: Diagnosis not present

## 2017-04-30 DIAGNOSIS — I509 Heart failure, unspecified: Secondary | ICD-10-CM | POA: Diagnosis not present

## 2017-04-30 DIAGNOSIS — G4089 Other seizures: Secondary | ICD-10-CM | POA: Diagnosis not present

## 2017-04-30 DIAGNOSIS — I739 Peripheral vascular disease, unspecified: Secondary | ICD-10-CM | POA: Diagnosis not present

## 2017-05-01 DIAGNOSIS — M0680 Other specified rheumatoid arthritis, unspecified site: Secondary | ICD-10-CM | POA: Diagnosis not present

## 2017-05-01 DIAGNOSIS — I6789 Other cerebrovascular disease: Secondary | ICD-10-CM | POA: Diagnosis not present

## 2017-05-01 DIAGNOSIS — E46 Unspecified protein-calorie malnutrition: Secondary | ICD-10-CM | POA: Diagnosis not present

## 2017-05-01 DIAGNOSIS — E118 Type 2 diabetes mellitus with unspecified complications: Secondary | ICD-10-CM | POA: Diagnosis not present

## 2017-05-01 DIAGNOSIS — G4089 Other seizures: Secondary | ICD-10-CM | POA: Diagnosis not present

## 2017-05-01 DIAGNOSIS — F0150 Vascular dementia without behavioral disturbance: Secondary | ICD-10-CM | POA: Diagnosis not present

## 2017-05-02 DIAGNOSIS — E46 Unspecified protein-calorie malnutrition: Secondary | ICD-10-CM | POA: Diagnosis not present

## 2017-05-02 DIAGNOSIS — I6789 Other cerebrovascular disease: Secondary | ICD-10-CM | POA: Diagnosis not present

## 2017-05-02 DIAGNOSIS — G4089 Other seizures: Secondary | ICD-10-CM | POA: Diagnosis not present

## 2017-05-02 DIAGNOSIS — F0150 Vascular dementia without behavioral disturbance: Secondary | ICD-10-CM | POA: Diagnosis not present

## 2017-05-02 DIAGNOSIS — E118 Type 2 diabetes mellitus with unspecified complications: Secondary | ICD-10-CM | POA: Diagnosis not present

## 2017-05-02 DIAGNOSIS — M0680 Other specified rheumatoid arthritis, unspecified site: Secondary | ICD-10-CM | POA: Diagnosis not present

## 2017-05-04 DIAGNOSIS — I6789 Other cerebrovascular disease: Secondary | ICD-10-CM | POA: Diagnosis not present

## 2017-05-04 DIAGNOSIS — M0680 Other specified rheumatoid arthritis, unspecified site: Secondary | ICD-10-CM | POA: Diagnosis not present

## 2017-05-04 DIAGNOSIS — F0150 Vascular dementia without behavioral disturbance: Secondary | ICD-10-CM | POA: Diagnosis not present

## 2017-05-04 DIAGNOSIS — G4089 Other seizures: Secondary | ICD-10-CM | POA: Diagnosis not present

## 2017-05-04 DIAGNOSIS — E118 Type 2 diabetes mellitus with unspecified complications: Secondary | ICD-10-CM | POA: Diagnosis not present

## 2017-05-04 DIAGNOSIS — E46 Unspecified protein-calorie malnutrition: Secondary | ICD-10-CM | POA: Diagnosis not present

## 2017-05-07 DIAGNOSIS — F0150 Vascular dementia without behavioral disturbance: Secondary | ICD-10-CM | POA: Diagnosis not present

## 2017-05-07 DIAGNOSIS — I6789 Other cerebrovascular disease: Secondary | ICD-10-CM | POA: Diagnosis not present

## 2017-05-07 DIAGNOSIS — E118 Type 2 diabetes mellitus with unspecified complications: Secondary | ICD-10-CM | POA: Diagnosis not present

## 2017-05-07 DIAGNOSIS — E46 Unspecified protein-calorie malnutrition: Secondary | ICD-10-CM | POA: Diagnosis not present

## 2017-05-07 DIAGNOSIS — G4089 Other seizures: Secondary | ICD-10-CM | POA: Diagnosis not present

## 2017-05-07 DIAGNOSIS — M0680 Other specified rheumatoid arthritis, unspecified site: Secondary | ICD-10-CM | POA: Diagnosis not present

## 2017-05-09 DIAGNOSIS — E46 Unspecified protein-calorie malnutrition: Secondary | ICD-10-CM | POA: Diagnosis not present

## 2017-05-09 DIAGNOSIS — F0150 Vascular dementia without behavioral disturbance: Secondary | ICD-10-CM | POA: Diagnosis not present

## 2017-05-09 DIAGNOSIS — I6789 Other cerebrovascular disease: Secondary | ICD-10-CM | POA: Diagnosis not present

## 2017-05-09 DIAGNOSIS — G4089 Other seizures: Secondary | ICD-10-CM | POA: Diagnosis not present

## 2017-05-09 DIAGNOSIS — E118 Type 2 diabetes mellitus with unspecified complications: Secondary | ICD-10-CM | POA: Diagnosis not present

## 2017-05-09 DIAGNOSIS — M0680 Other specified rheumatoid arthritis, unspecified site: Secondary | ICD-10-CM | POA: Diagnosis not present

## 2017-05-11 DIAGNOSIS — F0150 Vascular dementia without behavioral disturbance: Secondary | ICD-10-CM | POA: Diagnosis not present

## 2017-05-11 DIAGNOSIS — M0680 Other specified rheumatoid arthritis, unspecified site: Secondary | ICD-10-CM | POA: Diagnosis not present

## 2017-05-11 DIAGNOSIS — E118 Type 2 diabetes mellitus with unspecified complications: Secondary | ICD-10-CM | POA: Diagnosis not present

## 2017-05-11 DIAGNOSIS — G4089 Other seizures: Secondary | ICD-10-CM | POA: Diagnosis not present

## 2017-05-11 DIAGNOSIS — I6789 Other cerebrovascular disease: Secondary | ICD-10-CM | POA: Diagnosis not present

## 2017-05-11 DIAGNOSIS — E46 Unspecified protein-calorie malnutrition: Secondary | ICD-10-CM | POA: Diagnosis not present

## 2017-05-14 DIAGNOSIS — E118 Type 2 diabetes mellitus with unspecified complications: Secondary | ICD-10-CM | POA: Diagnosis not present

## 2017-05-14 DIAGNOSIS — M0680 Other specified rheumatoid arthritis, unspecified site: Secondary | ICD-10-CM | POA: Diagnosis not present

## 2017-05-14 DIAGNOSIS — E46 Unspecified protein-calorie malnutrition: Secondary | ICD-10-CM | POA: Diagnosis not present

## 2017-05-14 DIAGNOSIS — F0150 Vascular dementia without behavioral disturbance: Secondary | ICD-10-CM | POA: Diagnosis not present

## 2017-05-14 DIAGNOSIS — I6789 Other cerebrovascular disease: Secondary | ICD-10-CM | POA: Diagnosis not present

## 2017-05-14 DIAGNOSIS — G4089 Other seizures: Secondary | ICD-10-CM | POA: Diagnosis not present

## 2017-05-16 DIAGNOSIS — M0680 Other specified rheumatoid arthritis, unspecified site: Secondary | ICD-10-CM | POA: Diagnosis not present

## 2017-05-16 DIAGNOSIS — F0150 Vascular dementia without behavioral disturbance: Secondary | ICD-10-CM | POA: Diagnosis not present

## 2017-05-16 DIAGNOSIS — E46 Unspecified protein-calorie malnutrition: Secondary | ICD-10-CM | POA: Diagnosis not present

## 2017-05-16 DIAGNOSIS — G4089 Other seizures: Secondary | ICD-10-CM | POA: Diagnosis not present

## 2017-05-16 DIAGNOSIS — E118 Type 2 diabetes mellitus with unspecified complications: Secondary | ICD-10-CM | POA: Diagnosis not present

## 2017-05-16 DIAGNOSIS — I6789 Other cerebrovascular disease: Secondary | ICD-10-CM | POA: Diagnosis not present

## 2017-05-18 DIAGNOSIS — E46 Unspecified protein-calorie malnutrition: Secondary | ICD-10-CM | POA: Diagnosis not present

## 2017-05-18 DIAGNOSIS — I6789 Other cerebrovascular disease: Secondary | ICD-10-CM | POA: Diagnosis not present

## 2017-05-18 DIAGNOSIS — E118 Type 2 diabetes mellitus with unspecified complications: Secondary | ICD-10-CM | POA: Diagnosis not present

## 2017-05-18 DIAGNOSIS — M0680 Other specified rheumatoid arthritis, unspecified site: Secondary | ICD-10-CM | POA: Diagnosis not present

## 2017-05-18 DIAGNOSIS — F0150 Vascular dementia without behavioral disturbance: Secondary | ICD-10-CM | POA: Diagnosis not present

## 2017-05-18 DIAGNOSIS — G4089 Other seizures: Secondary | ICD-10-CM | POA: Diagnosis not present

## 2017-05-21 DIAGNOSIS — I6789 Other cerebrovascular disease: Secondary | ICD-10-CM | POA: Diagnosis not present

## 2017-05-21 DIAGNOSIS — E118 Type 2 diabetes mellitus with unspecified complications: Secondary | ICD-10-CM | POA: Diagnosis not present

## 2017-05-21 DIAGNOSIS — F0150 Vascular dementia without behavioral disturbance: Secondary | ICD-10-CM | POA: Diagnosis not present

## 2017-05-21 DIAGNOSIS — M0680 Other specified rheumatoid arthritis, unspecified site: Secondary | ICD-10-CM | POA: Diagnosis not present

## 2017-05-21 DIAGNOSIS — G4089 Other seizures: Secondary | ICD-10-CM | POA: Diagnosis not present

## 2017-05-21 DIAGNOSIS — E46 Unspecified protein-calorie malnutrition: Secondary | ICD-10-CM | POA: Diagnosis not present

## 2017-05-23 DIAGNOSIS — G4089 Other seizures: Secondary | ICD-10-CM | POA: Diagnosis not present

## 2017-05-23 DIAGNOSIS — E118 Type 2 diabetes mellitus with unspecified complications: Secondary | ICD-10-CM | POA: Diagnosis not present

## 2017-05-23 DIAGNOSIS — F0150 Vascular dementia without behavioral disturbance: Secondary | ICD-10-CM | POA: Diagnosis not present

## 2017-05-23 DIAGNOSIS — M0680 Other specified rheumatoid arthritis, unspecified site: Secondary | ICD-10-CM | POA: Diagnosis not present

## 2017-05-23 DIAGNOSIS — I6789 Other cerebrovascular disease: Secondary | ICD-10-CM | POA: Diagnosis not present

## 2017-05-23 DIAGNOSIS — E46 Unspecified protein-calorie malnutrition: Secondary | ICD-10-CM | POA: Diagnosis not present

## 2017-05-25 DIAGNOSIS — E46 Unspecified protein-calorie malnutrition: Secondary | ICD-10-CM | POA: Diagnosis not present

## 2017-05-25 DIAGNOSIS — I6789 Other cerebrovascular disease: Secondary | ICD-10-CM | POA: Diagnosis not present

## 2017-05-25 DIAGNOSIS — M0680 Other specified rheumatoid arthritis, unspecified site: Secondary | ICD-10-CM | POA: Diagnosis not present

## 2017-05-25 DIAGNOSIS — G4089 Other seizures: Secondary | ICD-10-CM | POA: Diagnosis not present

## 2017-05-25 DIAGNOSIS — E118 Type 2 diabetes mellitus with unspecified complications: Secondary | ICD-10-CM | POA: Diagnosis not present

## 2017-05-25 DIAGNOSIS — F0150 Vascular dementia without behavioral disturbance: Secondary | ICD-10-CM | POA: Diagnosis not present

## 2017-05-27 ENCOUNTER — Other Ambulatory Visit: Payer: Self-pay | Admitting: Family Medicine

## 2017-05-28 DIAGNOSIS — E46 Unspecified protein-calorie malnutrition: Secondary | ICD-10-CM | POA: Diagnosis not present

## 2017-05-28 DIAGNOSIS — G4089 Other seizures: Secondary | ICD-10-CM | POA: Diagnosis not present

## 2017-05-28 DIAGNOSIS — I6789 Other cerebrovascular disease: Secondary | ICD-10-CM | POA: Diagnosis not present

## 2017-05-28 DIAGNOSIS — F0150 Vascular dementia without behavioral disturbance: Secondary | ICD-10-CM | POA: Diagnosis not present

## 2017-05-28 DIAGNOSIS — E118 Type 2 diabetes mellitus with unspecified complications: Secondary | ICD-10-CM | POA: Diagnosis not present

## 2017-05-28 DIAGNOSIS — M0680 Other specified rheumatoid arthritis, unspecified site: Secondary | ICD-10-CM | POA: Diagnosis not present

## 2017-05-30 DIAGNOSIS — G4089 Other seizures: Secondary | ICD-10-CM | POA: Diagnosis not present

## 2017-05-30 DIAGNOSIS — E46 Unspecified protein-calorie malnutrition: Secondary | ICD-10-CM | POA: Diagnosis not present

## 2017-05-30 DIAGNOSIS — E118 Type 2 diabetes mellitus with unspecified complications: Secondary | ICD-10-CM | POA: Diagnosis not present

## 2017-05-30 DIAGNOSIS — M0680 Other specified rheumatoid arthritis, unspecified site: Secondary | ICD-10-CM | POA: Diagnosis not present

## 2017-05-30 DIAGNOSIS — F0150 Vascular dementia without behavioral disturbance: Secondary | ICD-10-CM | POA: Diagnosis not present

## 2017-05-30 DIAGNOSIS — I6789 Other cerebrovascular disease: Secondary | ICD-10-CM | POA: Diagnosis not present

## 2017-05-31 ENCOUNTER — Other Ambulatory Visit: Payer: Self-pay | Admitting: Family Medicine

## 2017-05-31 DIAGNOSIS — E46 Unspecified protein-calorie malnutrition: Secondary | ICD-10-CM | POA: Diagnosis not present

## 2017-05-31 DIAGNOSIS — E118 Type 2 diabetes mellitus with unspecified complications: Secondary | ICD-10-CM | POA: Diagnosis not present

## 2017-05-31 DIAGNOSIS — I509 Heart failure, unspecified: Secondary | ICD-10-CM | POA: Diagnosis not present

## 2017-05-31 DIAGNOSIS — R1310 Dysphagia, unspecified: Secondary | ICD-10-CM | POA: Diagnosis not present

## 2017-05-31 DIAGNOSIS — R531 Weakness: Secondary | ICD-10-CM | POA: Diagnosis not present

## 2017-05-31 DIAGNOSIS — F0150 Vascular dementia without behavioral disturbance: Secondary | ICD-10-CM | POA: Diagnosis not present

## 2017-05-31 DIAGNOSIS — E785 Hyperlipidemia, unspecified: Secondary | ICD-10-CM | POA: Diagnosis not present

## 2017-05-31 DIAGNOSIS — G4089 Other seizures: Secondary | ICD-10-CM | POA: Diagnosis not present

## 2017-05-31 DIAGNOSIS — M0680 Other specified rheumatoid arthritis, unspecified site: Secondary | ICD-10-CM | POA: Diagnosis not present

## 2017-05-31 DIAGNOSIS — I10 Essential (primary) hypertension: Secondary | ICD-10-CM | POA: Diagnosis not present

## 2017-05-31 DIAGNOSIS — I519 Heart disease, unspecified: Secondary | ICD-10-CM | POA: Diagnosis not present

## 2017-05-31 DIAGNOSIS — I6789 Other cerebrovascular disease: Secondary | ICD-10-CM | POA: Diagnosis not present

## 2017-05-31 DIAGNOSIS — I739 Peripheral vascular disease, unspecified: Secondary | ICD-10-CM | POA: Diagnosis not present

## 2017-06-01 DIAGNOSIS — E118 Type 2 diabetes mellitus with unspecified complications: Secondary | ICD-10-CM | POA: Diagnosis not present

## 2017-06-01 DIAGNOSIS — M0680 Other specified rheumatoid arthritis, unspecified site: Secondary | ICD-10-CM | POA: Diagnosis not present

## 2017-06-01 DIAGNOSIS — G4089 Other seizures: Secondary | ICD-10-CM | POA: Diagnosis not present

## 2017-06-01 DIAGNOSIS — F0150 Vascular dementia without behavioral disturbance: Secondary | ICD-10-CM | POA: Diagnosis not present

## 2017-06-01 DIAGNOSIS — I6789 Other cerebrovascular disease: Secondary | ICD-10-CM | POA: Diagnosis not present

## 2017-06-01 DIAGNOSIS — E46 Unspecified protein-calorie malnutrition: Secondary | ICD-10-CM | POA: Diagnosis not present

## 2017-06-04 DIAGNOSIS — E118 Type 2 diabetes mellitus with unspecified complications: Secondary | ICD-10-CM | POA: Diagnosis not present

## 2017-06-04 DIAGNOSIS — F0150 Vascular dementia without behavioral disturbance: Secondary | ICD-10-CM | POA: Diagnosis not present

## 2017-06-04 DIAGNOSIS — G4089 Other seizures: Secondary | ICD-10-CM | POA: Diagnosis not present

## 2017-06-04 DIAGNOSIS — I6789 Other cerebrovascular disease: Secondary | ICD-10-CM | POA: Diagnosis not present

## 2017-06-04 DIAGNOSIS — E46 Unspecified protein-calorie malnutrition: Secondary | ICD-10-CM | POA: Diagnosis not present

## 2017-06-04 DIAGNOSIS — M0680 Other specified rheumatoid arthritis, unspecified site: Secondary | ICD-10-CM | POA: Diagnosis not present

## 2017-06-05 DIAGNOSIS — E118 Type 2 diabetes mellitus with unspecified complications: Secondary | ICD-10-CM | POA: Diagnosis not present

## 2017-06-05 DIAGNOSIS — F0150 Vascular dementia without behavioral disturbance: Secondary | ICD-10-CM | POA: Diagnosis not present

## 2017-06-05 DIAGNOSIS — G4089 Other seizures: Secondary | ICD-10-CM | POA: Diagnosis not present

## 2017-06-05 DIAGNOSIS — I6789 Other cerebrovascular disease: Secondary | ICD-10-CM | POA: Diagnosis not present

## 2017-06-05 DIAGNOSIS — E46 Unspecified protein-calorie malnutrition: Secondary | ICD-10-CM | POA: Diagnosis not present

## 2017-06-05 DIAGNOSIS — M0680 Other specified rheumatoid arthritis, unspecified site: Secondary | ICD-10-CM | POA: Diagnosis not present

## 2017-06-06 DIAGNOSIS — M0680 Other specified rheumatoid arthritis, unspecified site: Secondary | ICD-10-CM | POA: Diagnosis not present

## 2017-06-06 DIAGNOSIS — I6789 Other cerebrovascular disease: Secondary | ICD-10-CM | POA: Diagnosis not present

## 2017-06-06 DIAGNOSIS — F0150 Vascular dementia without behavioral disturbance: Secondary | ICD-10-CM | POA: Diagnosis not present

## 2017-06-06 DIAGNOSIS — E118 Type 2 diabetes mellitus with unspecified complications: Secondary | ICD-10-CM | POA: Diagnosis not present

## 2017-06-06 DIAGNOSIS — E46 Unspecified protein-calorie malnutrition: Secondary | ICD-10-CM | POA: Diagnosis not present

## 2017-06-06 DIAGNOSIS — G4089 Other seizures: Secondary | ICD-10-CM | POA: Diagnosis not present

## 2017-06-07 DIAGNOSIS — F0150 Vascular dementia without behavioral disturbance: Secondary | ICD-10-CM | POA: Diagnosis not present

## 2017-06-07 DIAGNOSIS — G4089 Other seizures: Secondary | ICD-10-CM | POA: Diagnosis not present

## 2017-06-07 DIAGNOSIS — E46 Unspecified protein-calorie malnutrition: Secondary | ICD-10-CM | POA: Diagnosis not present

## 2017-06-07 DIAGNOSIS — E118 Type 2 diabetes mellitus with unspecified complications: Secondary | ICD-10-CM | POA: Diagnosis not present

## 2017-06-07 DIAGNOSIS — I6789 Other cerebrovascular disease: Secondary | ICD-10-CM | POA: Diagnosis not present

## 2017-06-07 DIAGNOSIS — M0680 Other specified rheumatoid arthritis, unspecified site: Secondary | ICD-10-CM | POA: Diagnosis not present

## 2017-06-11 DIAGNOSIS — M0680 Other specified rheumatoid arthritis, unspecified site: Secondary | ICD-10-CM | POA: Diagnosis not present

## 2017-06-11 DIAGNOSIS — I6789 Other cerebrovascular disease: Secondary | ICD-10-CM | POA: Diagnosis not present

## 2017-06-11 DIAGNOSIS — F0150 Vascular dementia without behavioral disturbance: Secondary | ICD-10-CM | POA: Diagnosis not present

## 2017-06-11 DIAGNOSIS — E118 Type 2 diabetes mellitus with unspecified complications: Secondary | ICD-10-CM | POA: Diagnosis not present

## 2017-06-11 DIAGNOSIS — E46 Unspecified protein-calorie malnutrition: Secondary | ICD-10-CM | POA: Diagnosis not present

## 2017-06-11 DIAGNOSIS — G4089 Other seizures: Secondary | ICD-10-CM | POA: Diagnosis not present

## 2017-06-13 DIAGNOSIS — I6789 Other cerebrovascular disease: Secondary | ICD-10-CM | POA: Diagnosis not present

## 2017-06-13 DIAGNOSIS — F0150 Vascular dementia without behavioral disturbance: Secondary | ICD-10-CM | POA: Diagnosis not present

## 2017-06-13 DIAGNOSIS — E46 Unspecified protein-calorie malnutrition: Secondary | ICD-10-CM | POA: Diagnosis not present

## 2017-06-13 DIAGNOSIS — E118 Type 2 diabetes mellitus with unspecified complications: Secondary | ICD-10-CM | POA: Diagnosis not present

## 2017-06-13 DIAGNOSIS — M0680 Other specified rheumatoid arthritis, unspecified site: Secondary | ICD-10-CM | POA: Diagnosis not present

## 2017-06-13 DIAGNOSIS — G4089 Other seizures: Secondary | ICD-10-CM | POA: Diagnosis not present

## 2017-06-18 DIAGNOSIS — F0150 Vascular dementia without behavioral disturbance: Secondary | ICD-10-CM | POA: Diagnosis not present

## 2017-06-18 DIAGNOSIS — G4089 Other seizures: Secondary | ICD-10-CM | POA: Diagnosis not present

## 2017-06-18 DIAGNOSIS — M0680 Other specified rheumatoid arthritis, unspecified site: Secondary | ICD-10-CM | POA: Diagnosis not present

## 2017-06-18 DIAGNOSIS — E118 Type 2 diabetes mellitus with unspecified complications: Secondary | ICD-10-CM | POA: Diagnosis not present

## 2017-06-18 DIAGNOSIS — I6789 Other cerebrovascular disease: Secondary | ICD-10-CM | POA: Diagnosis not present

## 2017-06-18 DIAGNOSIS — E46 Unspecified protein-calorie malnutrition: Secondary | ICD-10-CM | POA: Diagnosis not present

## 2017-06-20 DIAGNOSIS — E118 Type 2 diabetes mellitus with unspecified complications: Secondary | ICD-10-CM | POA: Diagnosis not present

## 2017-06-20 DIAGNOSIS — I6789 Other cerebrovascular disease: Secondary | ICD-10-CM | POA: Diagnosis not present

## 2017-06-20 DIAGNOSIS — E46 Unspecified protein-calorie malnutrition: Secondary | ICD-10-CM | POA: Diagnosis not present

## 2017-06-20 DIAGNOSIS — F0150 Vascular dementia without behavioral disturbance: Secondary | ICD-10-CM | POA: Diagnosis not present

## 2017-06-20 DIAGNOSIS — G4089 Other seizures: Secondary | ICD-10-CM | POA: Diagnosis not present

## 2017-06-20 DIAGNOSIS — M0680 Other specified rheumatoid arthritis, unspecified site: Secondary | ICD-10-CM | POA: Diagnosis not present

## 2017-06-25 DIAGNOSIS — M0680 Other specified rheumatoid arthritis, unspecified site: Secondary | ICD-10-CM | POA: Diagnosis not present

## 2017-06-25 DIAGNOSIS — G4089 Other seizures: Secondary | ICD-10-CM | POA: Diagnosis not present

## 2017-06-25 DIAGNOSIS — F0150 Vascular dementia without behavioral disturbance: Secondary | ICD-10-CM | POA: Diagnosis not present

## 2017-06-25 DIAGNOSIS — E118 Type 2 diabetes mellitus with unspecified complications: Secondary | ICD-10-CM | POA: Diagnosis not present

## 2017-06-25 DIAGNOSIS — I6789 Other cerebrovascular disease: Secondary | ICD-10-CM | POA: Diagnosis not present

## 2017-06-25 DIAGNOSIS — E46 Unspecified protein-calorie malnutrition: Secondary | ICD-10-CM | POA: Diagnosis not present

## 2017-06-26 DIAGNOSIS — M0680 Other specified rheumatoid arthritis, unspecified site: Secondary | ICD-10-CM | POA: Diagnosis not present

## 2017-06-26 DIAGNOSIS — G4089 Other seizures: Secondary | ICD-10-CM | POA: Diagnosis not present

## 2017-06-26 DIAGNOSIS — E118 Type 2 diabetes mellitus with unspecified complications: Secondary | ICD-10-CM | POA: Diagnosis not present

## 2017-06-26 DIAGNOSIS — F0150 Vascular dementia without behavioral disturbance: Secondary | ICD-10-CM | POA: Diagnosis not present

## 2017-06-26 DIAGNOSIS — E46 Unspecified protein-calorie malnutrition: Secondary | ICD-10-CM | POA: Diagnosis not present

## 2017-06-26 DIAGNOSIS — I6789 Other cerebrovascular disease: Secondary | ICD-10-CM | POA: Diagnosis not present

## 2017-06-27 DIAGNOSIS — G4089 Other seizures: Secondary | ICD-10-CM | POA: Diagnosis not present

## 2017-06-27 DIAGNOSIS — F0150 Vascular dementia without behavioral disturbance: Secondary | ICD-10-CM | POA: Diagnosis not present

## 2017-06-27 DIAGNOSIS — E118 Type 2 diabetes mellitus with unspecified complications: Secondary | ICD-10-CM | POA: Diagnosis not present

## 2017-06-27 DIAGNOSIS — I6789 Other cerebrovascular disease: Secondary | ICD-10-CM | POA: Diagnosis not present

## 2017-06-27 DIAGNOSIS — M0680 Other specified rheumatoid arthritis, unspecified site: Secondary | ICD-10-CM | POA: Diagnosis not present

## 2017-06-27 DIAGNOSIS — E46 Unspecified protein-calorie malnutrition: Secondary | ICD-10-CM | POA: Diagnosis not present

## 2017-06-30 DIAGNOSIS — I6789 Other cerebrovascular disease: Secondary | ICD-10-CM | POA: Diagnosis not present

## 2017-06-30 DIAGNOSIS — M0680 Other specified rheumatoid arthritis, unspecified site: Secondary | ICD-10-CM | POA: Diagnosis not present

## 2017-06-30 DIAGNOSIS — E118 Type 2 diabetes mellitus with unspecified complications: Secondary | ICD-10-CM | POA: Diagnosis not present

## 2017-06-30 DIAGNOSIS — I739 Peripheral vascular disease, unspecified: Secondary | ICD-10-CM | POA: Diagnosis not present

## 2017-06-30 DIAGNOSIS — E46 Unspecified protein-calorie malnutrition: Secondary | ICD-10-CM | POA: Diagnosis not present

## 2017-06-30 DIAGNOSIS — I519 Heart disease, unspecified: Secondary | ICD-10-CM | POA: Diagnosis not present

## 2017-06-30 DIAGNOSIS — I509 Heart failure, unspecified: Secondary | ICD-10-CM | POA: Diagnosis not present

## 2017-06-30 DIAGNOSIS — E785 Hyperlipidemia, unspecified: Secondary | ICD-10-CM | POA: Diagnosis not present

## 2017-06-30 DIAGNOSIS — R531 Weakness: Secondary | ICD-10-CM | POA: Diagnosis not present

## 2017-06-30 DIAGNOSIS — R1310 Dysphagia, unspecified: Secondary | ICD-10-CM | POA: Diagnosis not present

## 2017-06-30 DIAGNOSIS — G4089 Other seizures: Secondary | ICD-10-CM | POA: Diagnosis not present

## 2017-06-30 DIAGNOSIS — F0150 Vascular dementia without behavioral disturbance: Secondary | ICD-10-CM | POA: Diagnosis not present

## 2017-06-30 DIAGNOSIS — I10 Essential (primary) hypertension: Secondary | ICD-10-CM | POA: Diagnosis not present

## 2017-07-03 DIAGNOSIS — E118 Type 2 diabetes mellitus with unspecified complications: Secondary | ICD-10-CM | POA: Diagnosis not present

## 2017-07-03 DIAGNOSIS — F0150 Vascular dementia without behavioral disturbance: Secondary | ICD-10-CM | POA: Diagnosis not present

## 2017-07-03 DIAGNOSIS — M0680 Other specified rheumatoid arthritis, unspecified site: Secondary | ICD-10-CM | POA: Diagnosis not present

## 2017-07-03 DIAGNOSIS — I6789 Other cerebrovascular disease: Secondary | ICD-10-CM | POA: Diagnosis not present

## 2017-07-03 DIAGNOSIS — G4089 Other seizures: Secondary | ICD-10-CM | POA: Diagnosis not present

## 2017-07-03 DIAGNOSIS — E46 Unspecified protein-calorie malnutrition: Secondary | ICD-10-CM | POA: Diagnosis not present

## 2017-07-06 DIAGNOSIS — E118 Type 2 diabetes mellitus with unspecified complications: Secondary | ICD-10-CM | POA: Diagnosis not present

## 2017-07-06 DIAGNOSIS — I6789 Other cerebrovascular disease: Secondary | ICD-10-CM | POA: Diagnosis not present

## 2017-07-06 DIAGNOSIS — M0680 Other specified rheumatoid arthritis, unspecified site: Secondary | ICD-10-CM | POA: Diagnosis not present

## 2017-07-06 DIAGNOSIS — E46 Unspecified protein-calorie malnutrition: Secondary | ICD-10-CM | POA: Diagnosis not present

## 2017-07-06 DIAGNOSIS — F0150 Vascular dementia without behavioral disturbance: Secondary | ICD-10-CM | POA: Diagnosis not present

## 2017-07-06 DIAGNOSIS — G4089 Other seizures: Secondary | ICD-10-CM | POA: Diagnosis not present

## 2017-07-11 DIAGNOSIS — E46 Unspecified protein-calorie malnutrition: Secondary | ICD-10-CM | POA: Diagnosis not present

## 2017-07-11 DIAGNOSIS — M0680 Other specified rheumatoid arthritis, unspecified site: Secondary | ICD-10-CM | POA: Diagnosis not present

## 2017-07-11 DIAGNOSIS — I6789 Other cerebrovascular disease: Secondary | ICD-10-CM | POA: Diagnosis not present

## 2017-07-11 DIAGNOSIS — F0150 Vascular dementia without behavioral disturbance: Secondary | ICD-10-CM | POA: Diagnosis not present

## 2017-07-11 DIAGNOSIS — E118 Type 2 diabetes mellitus with unspecified complications: Secondary | ICD-10-CM | POA: Diagnosis not present

## 2017-07-11 DIAGNOSIS — G4089 Other seizures: Secondary | ICD-10-CM | POA: Diagnosis not present

## 2017-07-12 DIAGNOSIS — E46 Unspecified protein-calorie malnutrition: Secondary | ICD-10-CM | POA: Diagnosis not present

## 2017-07-12 DIAGNOSIS — I6789 Other cerebrovascular disease: Secondary | ICD-10-CM | POA: Diagnosis not present

## 2017-07-12 DIAGNOSIS — G4089 Other seizures: Secondary | ICD-10-CM | POA: Diagnosis not present

## 2017-07-12 DIAGNOSIS — F0150 Vascular dementia without behavioral disturbance: Secondary | ICD-10-CM | POA: Diagnosis not present

## 2017-07-12 DIAGNOSIS — E118 Type 2 diabetes mellitus with unspecified complications: Secondary | ICD-10-CM | POA: Diagnosis not present

## 2017-07-12 DIAGNOSIS — M0680 Other specified rheumatoid arthritis, unspecified site: Secondary | ICD-10-CM | POA: Diagnosis not present

## 2017-07-15 ENCOUNTER — Other Ambulatory Visit: Payer: Self-pay | Admitting: Family Medicine

## 2017-07-16 DIAGNOSIS — G4089 Other seizures: Secondary | ICD-10-CM | POA: Diagnosis not present

## 2017-07-16 DIAGNOSIS — E118 Type 2 diabetes mellitus with unspecified complications: Secondary | ICD-10-CM | POA: Diagnosis not present

## 2017-07-16 DIAGNOSIS — M0680 Other specified rheumatoid arthritis, unspecified site: Secondary | ICD-10-CM | POA: Diagnosis not present

## 2017-07-16 DIAGNOSIS — I6789 Other cerebrovascular disease: Secondary | ICD-10-CM | POA: Diagnosis not present

## 2017-07-16 DIAGNOSIS — F0150 Vascular dementia without behavioral disturbance: Secondary | ICD-10-CM | POA: Diagnosis not present

## 2017-07-16 DIAGNOSIS — E46 Unspecified protein-calorie malnutrition: Secondary | ICD-10-CM | POA: Diagnosis not present

## 2017-07-18 DIAGNOSIS — E118 Type 2 diabetes mellitus with unspecified complications: Secondary | ICD-10-CM | POA: Diagnosis not present

## 2017-07-18 DIAGNOSIS — E46 Unspecified protein-calorie malnutrition: Secondary | ICD-10-CM | POA: Diagnosis not present

## 2017-07-18 DIAGNOSIS — G4089 Other seizures: Secondary | ICD-10-CM | POA: Diagnosis not present

## 2017-07-18 DIAGNOSIS — M0680 Other specified rheumatoid arthritis, unspecified site: Secondary | ICD-10-CM | POA: Diagnosis not present

## 2017-07-18 DIAGNOSIS — I6789 Other cerebrovascular disease: Secondary | ICD-10-CM | POA: Diagnosis not present

## 2017-07-18 DIAGNOSIS — F0150 Vascular dementia without behavioral disturbance: Secondary | ICD-10-CM | POA: Diagnosis not present

## 2017-07-19 DIAGNOSIS — I6789 Other cerebrovascular disease: Secondary | ICD-10-CM | POA: Diagnosis not present

## 2017-07-19 DIAGNOSIS — F0150 Vascular dementia without behavioral disturbance: Secondary | ICD-10-CM | POA: Diagnosis not present

## 2017-07-19 DIAGNOSIS — E118 Type 2 diabetes mellitus with unspecified complications: Secondary | ICD-10-CM | POA: Diagnosis not present

## 2017-07-19 DIAGNOSIS — M0680 Other specified rheumatoid arthritis, unspecified site: Secondary | ICD-10-CM | POA: Diagnosis not present

## 2017-07-19 DIAGNOSIS — G4089 Other seizures: Secondary | ICD-10-CM | POA: Diagnosis not present

## 2017-07-19 DIAGNOSIS — E46 Unspecified protein-calorie malnutrition: Secondary | ICD-10-CM | POA: Diagnosis not present

## 2017-07-20 DIAGNOSIS — E46 Unspecified protein-calorie malnutrition: Secondary | ICD-10-CM | POA: Diagnosis not present

## 2017-07-20 DIAGNOSIS — F0150 Vascular dementia without behavioral disturbance: Secondary | ICD-10-CM | POA: Diagnosis not present

## 2017-07-20 DIAGNOSIS — M0680 Other specified rheumatoid arthritis, unspecified site: Secondary | ICD-10-CM | POA: Diagnosis not present

## 2017-07-20 DIAGNOSIS — I6789 Other cerebrovascular disease: Secondary | ICD-10-CM | POA: Diagnosis not present

## 2017-07-20 DIAGNOSIS — G4089 Other seizures: Secondary | ICD-10-CM | POA: Diagnosis not present

## 2017-07-20 DIAGNOSIS — E118 Type 2 diabetes mellitus with unspecified complications: Secondary | ICD-10-CM | POA: Diagnosis not present

## 2017-07-23 DIAGNOSIS — F0150 Vascular dementia without behavioral disturbance: Secondary | ICD-10-CM | POA: Diagnosis not present

## 2017-07-23 DIAGNOSIS — I6789 Other cerebrovascular disease: Secondary | ICD-10-CM | POA: Diagnosis not present

## 2017-07-23 DIAGNOSIS — M0680 Other specified rheumatoid arthritis, unspecified site: Secondary | ICD-10-CM | POA: Diagnosis not present

## 2017-07-23 DIAGNOSIS — G4089 Other seizures: Secondary | ICD-10-CM | POA: Diagnosis not present

## 2017-07-23 DIAGNOSIS — E118 Type 2 diabetes mellitus with unspecified complications: Secondary | ICD-10-CM | POA: Diagnosis not present

## 2017-07-23 DIAGNOSIS — E46 Unspecified protein-calorie malnutrition: Secondary | ICD-10-CM | POA: Diagnosis not present

## 2017-07-26 DIAGNOSIS — M0680 Other specified rheumatoid arthritis, unspecified site: Secondary | ICD-10-CM | POA: Diagnosis not present

## 2017-07-26 DIAGNOSIS — E46 Unspecified protein-calorie malnutrition: Secondary | ICD-10-CM | POA: Diagnosis not present

## 2017-07-26 DIAGNOSIS — G4089 Other seizures: Secondary | ICD-10-CM | POA: Diagnosis not present

## 2017-07-26 DIAGNOSIS — I6789 Other cerebrovascular disease: Secondary | ICD-10-CM | POA: Diagnosis not present

## 2017-07-26 DIAGNOSIS — F0150 Vascular dementia without behavioral disturbance: Secondary | ICD-10-CM | POA: Diagnosis not present

## 2017-07-26 DIAGNOSIS — E118 Type 2 diabetes mellitus with unspecified complications: Secondary | ICD-10-CM | POA: Diagnosis not present

## 2017-07-30 DIAGNOSIS — E118 Type 2 diabetes mellitus with unspecified complications: Secondary | ICD-10-CM | POA: Diagnosis not present

## 2017-07-30 DIAGNOSIS — G4089 Other seizures: Secondary | ICD-10-CM | POA: Diagnosis not present

## 2017-07-30 DIAGNOSIS — I6789 Other cerebrovascular disease: Secondary | ICD-10-CM | POA: Diagnosis not present

## 2017-07-30 DIAGNOSIS — F0150 Vascular dementia without behavioral disturbance: Secondary | ICD-10-CM | POA: Diagnosis not present

## 2017-07-30 DIAGNOSIS — M0680 Other specified rheumatoid arthritis, unspecified site: Secondary | ICD-10-CM | POA: Diagnosis not present

## 2017-07-30 DIAGNOSIS — E46 Unspecified protein-calorie malnutrition: Secondary | ICD-10-CM | POA: Diagnosis not present

## 2017-07-31 DIAGNOSIS — E785 Hyperlipidemia, unspecified: Secondary | ICD-10-CM | POA: Diagnosis not present

## 2017-07-31 DIAGNOSIS — I6789 Other cerebrovascular disease: Secondary | ICD-10-CM | POA: Diagnosis not present

## 2017-07-31 DIAGNOSIS — M0680 Other specified rheumatoid arthritis, unspecified site: Secondary | ICD-10-CM | POA: Diagnosis not present

## 2017-07-31 DIAGNOSIS — E118 Type 2 diabetes mellitus with unspecified complications: Secondary | ICD-10-CM | POA: Diagnosis not present

## 2017-07-31 DIAGNOSIS — I739 Peripheral vascular disease, unspecified: Secondary | ICD-10-CM | POA: Diagnosis not present

## 2017-07-31 DIAGNOSIS — I519 Heart disease, unspecified: Secondary | ICD-10-CM | POA: Diagnosis not present

## 2017-07-31 DIAGNOSIS — R1310 Dysphagia, unspecified: Secondary | ICD-10-CM | POA: Diagnosis not present

## 2017-07-31 DIAGNOSIS — G4089 Other seizures: Secondary | ICD-10-CM | POA: Diagnosis not present

## 2017-07-31 DIAGNOSIS — F0150 Vascular dementia without behavioral disturbance: Secondary | ICD-10-CM | POA: Diagnosis not present

## 2017-07-31 DIAGNOSIS — I10 Essential (primary) hypertension: Secondary | ICD-10-CM | POA: Diagnosis not present

## 2017-07-31 DIAGNOSIS — R531 Weakness: Secondary | ICD-10-CM | POA: Diagnosis not present

## 2017-07-31 DIAGNOSIS — I509 Heart failure, unspecified: Secondary | ICD-10-CM | POA: Diagnosis not present

## 2017-07-31 DIAGNOSIS — E46 Unspecified protein-calorie malnutrition: Secondary | ICD-10-CM | POA: Diagnosis not present

## 2017-08-01 DIAGNOSIS — G4089 Other seizures: Secondary | ICD-10-CM | POA: Diagnosis not present

## 2017-08-01 DIAGNOSIS — E46 Unspecified protein-calorie malnutrition: Secondary | ICD-10-CM | POA: Diagnosis not present

## 2017-08-01 DIAGNOSIS — I6789 Other cerebrovascular disease: Secondary | ICD-10-CM | POA: Diagnosis not present

## 2017-08-01 DIAGNOSIS — M0680 Other specified rheumatoid arthritis, unspecified site: Secondary | ICD-10-CM | POA: Diagnosis not present

## 2017-08-01 DIAGNOSIS — F0150 Vascular dementia without behavioral disturbance: Secondary | ICD-10-CM | POA: Diagnosis not present

## 2017-08-01 DIAGNOSIS — E118 Type 2 diabetes mellitus with unspecified complications: Secondary | ICD-10-CM | POA: Diagnosis not present

## 2017-08-02 DIAGNOSIS — I6789 Other cerebrovascular disease: Secondary | ICD-10-CM | POA: Diagnosis not present

## 2017-08-02 DIAGNOSIS — G4089 Other seizures: Secondary | ICD-10-CM | POA: Diagnosis not present

## 2017-08-02 DIAGNOSIS — E118 Type 2 diabetes mellitus with unspecified complications: Secondary | ICD-10-CM | POA: Diagnosis not present

## 2017-08-02 DIAGNOSIS — M0680 Other specified rheumatoid arthritis, unspecified site: Secondary | ICD-10-CM | POA: Diagnosis not present

## 2017-08-02 DIAGNOSIS — E46 Unspecified protein-calorie malnutrition: Secondary | ICD-10-CM | POA: Diagnosis not present

## 2017-08-02 DIAGNOSIS — F0150 Vascular dementia without behavioral disturbance: Secondary | ICD-10-CM | POA: Diagnosis not present

## 2017-08-03 DIAGNOSIS — G4089 Other seizures: Secondary | ICD-10-CM | POA: Diagnosis not present

## 2017-08-03 DIAGNOSIS — M0680 Other specified rheumatoid arthritis, unspecified site: Secondary | ICD-10-CM | POA: Diagnosis not present

## 2017-08-03 DIAGNOSIS — E118 Type 2 diabetes mellitus with unspecified complications: Secondary | ICD-10-CM | POA: Diagnosis not present

## 2017-08-03 DIAGNOSIS — I6789 Other cerebrovascular disease: Secondary | ICD-10-CM | POA: Diagnosis not present

## 2017-08-03 DIAGNOSIS — F0150 Vascular dementia without behavioral disturbance: Secondary | ICD-10-CM | POA: Diagnosis not present

## 2017-08-03 DIAGNOSIS — E46 Unspecified protein-calorie malnutrition: Secondary | ICD-10-CM | POA: Diagnosis not present

## 2017-08-04 DIAGNOSIS — I6789 Other cerebrovascular disease: Secondary | ICD-10-CM | POA: Diagnosis not present

## 2017-08-04 DIAGNOSIS — E46 Unspecified protein-calorie malnutrition: Secondary | ICD-10-CM | POA: Diagnosis not present

## 2017-08-04 DIAGNOSIS — F0150 Vascular dementia without behavioral disturbance: Secondary | ICD-10-CM | POA: Diagnosis not present

## 2017-08-04 DIAGNOSIS — M0680 Other specified rheumatoid arthritis, unspecified site: Secondary | ICD-10-CM | POA: Diagnosis not present

## 2017-08-04 DIAGNOSIS — G4089 Other seizures: Secondary | ICD-10-CM | POA: Diagnosis not present

## 2017-08-04 DIAGNOSIS — E118 Type 2 diabetes mellitus with unspecified complications: Secondary | ICD-10-CM | POA: Diagnosis not present

## 2017-08-05 DIAGNOSIS — E46 Unspecified protein-calorie malnutrition: Secondary | ICD-10-CM | POA: Diagnosis not present

## 2017-08-05 DIAGNOSIS — G4089 Other seizures: Secondary | ICD-10-CM | POA: Diagnosis not present

## 2017-08-05 DIAGNOSIS — F0150 Vascular dementia without behavioral disturbance: Secondary | ICD-10-CM | POA: Diagnosis not present

## 2017-08-05 DIAGNOSIS — E118 Type 2 diabetes mellitus with unspecified complications: Secondary | ICD-10-CM | POA: Diagnosis not present

## 2017-08-05 DIAGNOSIS — M0680 Other specified rheumatoid arthritis, unspecified site: Secondary | ICD-10-CM | POA: Diagnosis not present

## 2017-08-05 DIAGNOSIS — I6789 Other cerebrovascular disease: Secondary | ICD-10-CM | POA: Diagnosis not present

## 2017-08-06 DIAGNOSIS — E118 Type 2 diabetes mellitus with unspecified complications: Secondary | ICD-10-CM | POA: Diagnosis not present

## 2017-08-06 DIAGNOSIS — F0150 Vascular dementia without behavioral disturbance: Secondary | ICD-10-CM | POA: Diagnosis not present

## 2017-08-06 DIAGNOSIS — I6789 Other cerebrovascular disease: Secondary | ICD-10-CM | POA: Diagnosis not present

## 2017-08-06 DIAGNOSIS — M0680 Other specified rheumatoid arthritis, unspecified site: Secondary | ICD-10-CM | POA: Diagnosis not present

## 2017-08-06 DIAGNOSIS — G4089 Other seizures: Secondary | ICD-10-CM | POA: Diagnosis not present

## 2017-08-06 DIAGNOSIS — E46 Unspecified protein-calorie malnutrition: Secondary | ICD-10-CM | POA: Diagnosis not present

## 2017-08-07 DIAGNOSIS — G4089 Other seizures: Secondary | ICD-10-CM | POA: Diagnosis not present

## 2017-08-07 DIAGNOSIS — F0150 Vascular dementia without behavioral disturbance: Secondary | ICD-10-CM | POA: Diagnosis not present

## 2017-08-07 DIAGNOSIS — M0680 Other specified rheumatoid arthritis, unspecified site: Secondary | ICD-10-CM | POA: Diagnosis not present

## 2017-08-07 DIAGNOSIS — E46 Unspecified protein-calorie malnutrition: Secondary | ICD-10-CM | POA: Diagnosis not present

## 2017-08-07 DIAGNOSIS — I6789 Other cerebrovascular disease: Secondary | ICD-10-CM | POA: Diagnosis not present

## 2017-08-07 DIAGNOSIS — E118 Type 2 diabetes mellitus with unspecified complications: Secondary | ICD-10-CM | POA: Diagnosis not present

## 2017-08-08 DIAGNOSIS — I6789 Other cerebrovascular disease: Secondary | ICD-10-CM | POA: Diagnosis not present

## 2017-08-08 DIAGNOSIS — F0150 Vascular dementia without behavioral disturbance: Secondary | ICD-10-CM | POA: Diagnosis not present

## 2017-08-08 DIAGNOSIS — E46 Unspecified protein-calorie malnutrition: Secondary | ICD-10-CM | POA: Diagnosis not present

## 2017-08-08 DIAGNOSIS — E118 Type 2 diabetes mellitus with unspecified complications: Secondary | ICD-10-CM | POA: Diagnosis not present

## 2017-08-08 DIAGNOSIS — M0680 Other specified rheumatoid arthritis, unspecified site: Secondary | ICD-10-CM | POA: Diagnosis not present

## 2017-08-08 DIAGNOSIS — G4089 Other seizures: Secondary | ICD-10-CM | POA: Diagnosis not present

## 2017-08-09 DIAGNOSIS — I6789 Other cerebrovascular disease: Secondary | ICD-10-CM | POA: Diagnosis not present

## 2017-08-09 DIAGNOSIS — E118 Type 2 diabetes mellitus with unspecified complications: Secondary | ICD-10-CM | POA: Diagnosis not present

## 2017-08-09 DIAGNOSIS — G4089 Other seizures: Secondary | ICD-10-CM | POA: Diagnosis not present

## 2017-08-09 DIAGNOSIS — M0680 Other specified rheumatoid arthritis, unspecified site: Secondary | ICD-10-CM | POA: Diagnosis not present

## 2017-08-09 DIAGNOSIS — E46 Unspecified protein-calorie malnutrition: Secondary | ICD-10-CM | POA: Diagnosis not present

## 2017-08-09 DIAGNOSIS — F0150 Vascular dementia without behavioral disturbance: Secondary | ICD-10-CM | POA: Diagnosis not present

## 2017-08-31 DEATH — deceased
